# Patient Record
Sex: Female | Born: 1940 | Race: White | Hispanic: No | Smoking: Never smoker
Health system: Southern US, Community
[De-identification: ages and names within clinical notes are randomized; demographics above are authoritative.]

## PROBLEM LIST (undated history)

## (undated) DIAGNOSIS — R011 Cardiac murmur, unspecified: Secondary | ICD-10-CM

## (undated) DIAGNOSIS — E785 Hyperlipidemia, unspecified: Secondary | ICD-10-CM

## (undated) DIAGNOSIS — I10 Essential (primary) hypertension: Secondary | ICD-10-CM

## (undated) DIAGNOSIS — T8859XA Other complications of anesthesia, initial encounter: Secondary | ICD-10-CM

## (undated) DIAGNOSIS — R112 Nausea with vomiting, unspecified: Secondary | ICD-10-CM

## (undated) DIAGNOSIS — M8580 Other specified disorders of bone density and structure, unspecified site: Secondary | ICD-10-CM

## (undated) DIAGNOSIS — R739 Hyperglycemia, unspecified: Secondary | ICD-10-CM

## (undated) DIAGNOSIS — M1990 Unspecified osteoarthritis, unspecified site: Secondary | ICD-10-CM

## (undated) DIAGNOSIS — Z9889 Other specified postprocedural states: Secondary | ICD-10-CM

## (undated) HISTORY — DX: Hyperglycemia, unspecified: R73.9

## (undated) HISTORY — PX: CARPAL TUNNEL RELEASE: SHX101

## (undated) HISTORY — PX: BUNIONECTOMY: SHX129

## (undated) HISTORY — PX: CATARACT EXTRACTION: SUR2

## (undated) HISTORY — DX: Other specified disorders of bone density and structure, unspecified site: M85.80

## (undated) HISTORY — PX: ROTATOR CUFF REPAIR: SHX139

---

## 1999-03-04 HISTORY — PX: CHOLECYSTECTOMY: SHX55

## 2009-04-04 DIAGNOSIS — R112 Nausea with vomiting, unspecified: Secondary | ICD-10-CM | POA: Insufficient documentation

## 2009-04-04 DIAGNOSIS — R634 Abnormal weight loss: Secondary | ICD-10-CM | POA: Insufficient documentation

## 2009-04-04 DIAGNOSIS — I10 Essential (primary) hypertension: Secondary | ICD-10-CM | POA: Insufficient documentation

## 2009-04-04 DIAGNOSIS — E782 Mixed hyperlipidemia: Secondary | ICD-10-CM

## 2009-04-04 DIAGNOSIS — R109 Unspecified abdominal pain: Secondary | ICD-10-CM | POA: Insufficient documentation

## 2009-04-04 HISTORY — DX: Mixed hyperlipidemia: E78.2

## 2013-08-21 DIAGNOSIS — I10 Essential (primary) hypertension: Secondary | ICD-10-CM | POA: Insufficient documentation

## 2013-08-21 DIAGNOSIS — J309 Allergic rhinitis, unspecified: Secondary | ICD-10-CM

## 2013-08-21 DIAGNOSIS — M8949 Other hypertrophic osteoarthropathy, multiple sites: Secondary | ICD-10-CM | POA: Insufficient documentation

## 2013-08-21 DIAGNOSIS — M150 Primary generalized (osteo)arthritis: Secondary | ICD-10-CM

## 2013-08-21 DIAGNOSIS — R270 Ataxia, unspecified: Secondary | ICD-10-CM | POA: Insufficient documentation

## 2013-08-21 DIAGNOSIS — R609 Edema, unspecified: Secondary | ICD-10-CM | POA: Insufficient documentation

## 2013-08-21 DIAGNOSIS — M159 Polyosteoarthritis, unspecified: Secondary | ICD-10-CM | POA: Insufficient documentation

## 2013-08-21 DIAGNOSIS — M26629 Arthralgia of temporomandibular joint, unspecified side: Secondary | ICD-10-CM | POA: Insufficient documentation

## 2013-08-21 DIAGNOSIS — K5900 Constipation, unspecified: Secondary | ICD-10-CM | POA: Insufficient documentation

## 2013-08-21 DIAGNOSIS — N951 Menopausal and female climacteric states: Secondary | ICD-10-CM | POA: Insufficient documentation

## 2013-08-21 DIAGNOSIS — R42 Dizziness and giddiness: Secondary | ICD-10-CM | POA: Insufficient documentation

## 2013-08-21 DIAGNOSIS — M8580 Other specified disorders of bone density and structure, unspecified site: Secondary | ICD-10-CM | POA: Insufficient documentation

## 2013-08-21 DIAGNOSIS — K219 Gastro-esophageal reflux disease without esophagitis: Secondary | ICD-10-CM | POA: Insufficient documentation

## 2013-08-21 DIAGNOSIS — M810 Age-related osteoporosis without current pathological fracture: Secondary | ICD-10-CM | POA: Insufficient documentation

## 2013-08-21 DIAGNOSIS — IMO0002 Reserved for concepts with insufficient information to code with codable children: Secondary | ICD-10-CM | POA: Insufficient documentation

## 2013-08-21 HISTORY — DX: Allergic rhinitis, unspecified: J30.9

## 2013-08-21 HISTORY — DX: Age-related osteoporosis without current pathological fracture: M81.0

## 2013-08-21 HISTORY — DX: Primary generalized (osteo)arthritis: M15.0

## 2014-02-22 ENCOUNTER — Emergency Department (HOSPITAL_BASED_OUTPATIENT_CLINIC_OR_DEPARTMENT_OTHER): Payer: Medicare Other

## 2014-02-22 ENCOUNTER — Encounter (HOSPITAL_BASED_OUTPATIENT_CLINIC_OR_DEPARTMENT_OTHER): Payer: Self-pay | Admitting: *Deleted

## 2014-02-22 ENCOUNTER — Emergency Department (HOSPITAL_BASED_OUTPATIENT_CLINIC_OR_DEPARTMENT_OTHER)
Admission: EM | Admit: 2014-02-22 | Discharge: 2014-02-22 | Disposition: A | Discharge status: Home / Self Care | Payer: Medicare Other | Attending: Emergency Medicine | Admitting: Emergency Medicine

## 2014-02-22 DIAGNOSIS — E785 Hyperlipidemia, unspecified: Secondary | ICD-10-CM | POA: Insufficient documentation

## 2014-02-22 DIAGNOSIS — H109 Unspecified conjunctivitis: Secondary | ICD-10-CM | POA: Insufficient documentation

## 2014-02-22 DIAGNOSIS — R059 Cough, unspecified: Secondary | ICD-10-CM

## 2014-02-22 DIAGNOSIS — R05 Cough: Secondary | ICD-10-CM

## 2014-02-22 DIAGNOSIS — Z880 Allergy status to penicillin: Secondary | ICD-10-CM | POA: Insufficient documentation

## 2014-02-22 DIAGNOSIS — I10 Essential (primary) hypertension: Secondary | ICD-10-CM | POA: Insufficient documentation

## 2014-02-22 DIAGNOSIS — J209 Acute bronchitis, unspecified: Secondary | ICD-10-CM | POA: Diagnosis not present

## 2014-02-22 DIAGNOSIS — R000 Tachycardia, unspecified: Secondary | ICD-10-CM | POA: Diagnosis not present

## 2014-02-22 DIAGNOSIS — J40 Bronchitis, not specified as acute or chronic: Secondary | ICD-10-CM

## 2014-02-22 DIAGNOSIS — Z79899 Other long term (current) drug therapy: Secondary | ICD-10-CM | POA: Insufficient documentation

## 2014-02-22 HISTORY — DX: Essential (primary) hypertension: I10

## 2014-02-22 HISTORY — DX: Hyperlipidemia, unspecified: E78.5

## 2014-02-22 MED ORDER — HYDROCOD POLST-CHLORPHEN POLST 10-8 MG/5ML PO LQCR: 5.0000 mL | Freq: Two times a day (BID) | ORAL | Status: DC | PRN

## 2014-02-22 MED ORDER — ALBUTEROL SULFATE HFA 108 (90 BASE) MCG/ACT IN AERS
2.0000 | INHALATION_SPRAY | Freq: Once | RESPIRATORY_TRACT | Status: AC
  Administered 2014-02-22: 2 via RESPIRATORY_TRACT
  Filled 2014-02-22: qty 6.7

## 2014-02-22 MED ORDER — AZITHROMYCIN 250 MG PO TABS: 250.0000 mg | ORAL_TABLET | Freq: Every day | ORAL | Status: DC

## 2014-02-22 MED ORDER — ALBUTEROL SULFATE (2.5 MG/3ML) 0.083% IN NEBU
5.0000 mg | INHALATION_SOLUTION | Freq: Once | RESPIRATORY_TRACT | Status: AC
  Administered 2014-02-22: 5 mg via RESPIRATORY_TRACT
  Filled 2014-02-22: qty 6

## 2014-02-22 MED ORDER — ERYTHROMYCIN 5 MG/GM OP OINT: TOPICAL_OINTMENT | OPHTHALMIC | Status: DC

## 2014-02-22 NOTE — ED Provider Notes (Signed)
CSN: [id number]     Arrival date & time 02/22/14  1912 History   First MD Initiated Contact with Patient 02/22/14 2009     Chief Complaint  Patient presents with  . URI     (Consider location/radiation/quality/duration/timing/severity/associated sxs/prior Treatment) HPI Comments: 73 year old female with a past medical history of hyperlipidemia and hypertension presenting with cough and chest congestion 2 days. Cough is nonproductive. She reports chest pain with the cough only. Denies shortness of breath. She has tried taking over-the-counter cough medicine with no relief. Denies fever, nausea or vomiting. She is also complaining of redness and drainage to her left eye beginning earlier this evening. She has been in contact with her 229-year-old grandchild diagnosed with conjunctivitis. Denies any eye pain, just states that it is irritated.  Patient is a 73 y.o. female presenting with URI. The history is provided by the patient.  URI   Past Medical History  Diagnosis Date  . Hyperlipemia   . Hypertension    Past Surgical History  Procedure Laterality Date  . Cesarean section    . Rotator cuff repair    . Cataract extraction    . Carpal tunnel release     History reviewed. No pertinent family history. History  Substance Use Topics  . Smoking status: Never Smoker   . Smokeless tobacco: Not on file  . Alcohol Use: No   OB History    No data available     Review of Systems  10 Systems reviewed and are negative for acute change except as noted in the HPI.  Allergies  Penicillins  Home Medications   Prior to Admission medications   Medication Sig Start Date End Date Taking? Authorizing Provider  buPROPion (WELLBUTRIN XL) 300 MG 24 hr tablet Take 300 mg by mouth daily.   Yes Historical Provider, MD  furosemide (LASIX) 80 MG tablet Take 80 mg by mouth.   Yes Historical Provider, MD  lisinopril (PRINIVIL,ZESTRIL) 10 MG tablet Take 10 mg by mouth daily.   Yes Historical  Provider, MD  omeprazole (PRILOSEC) 20 MG capsule Take 20 mg by mouth daily.   Yes Historical Provider, MD  pravastatin (PRAVACHOL) 80 MG tablet Take 80 mg by mouth daily.   Yes Historical Provider, MD  azithromycin (ZITHROMAX) 250 MG tablet Take 1 tablet (250 mg total) by mouth daily. Take first 2 tablets together, then 1 every day until finished. 02/22/14   Kathrynn Speedobyn M Nyxon Strupp, PA-C  chlorpheniramine-HYDROcodone (TUSSIONEX PENNKINETIC ER) 10-8 MG/5ML LQCR Take 5 mLs by mouth every 12 (twelve) hours as needed for cough. 02/22/14   Kathrynn Speedobyn M Abner Ardis, PA-C  erythromycin ophthalmic ointment Place a 1/2 inch ribbon of ointment into the lower eyelid q6 hours. 02/22/14   Cassi Jenne M Geovannie Vilar, PA-C   BP 149/60 mmHg  Pulse 103  Temp(Src) 98.7 F (37.1 C)  Resp 18  Ht 5\' 4"  (1.626 m)  Wt 165 lb (74.844 kg)  BMI 28.31 kg/m2  SpO2 99% Physical Exam  Constitutional: She is oriented to person, place, and time. She appears well-developed and well-nourished. No distress.  HENT:  Head: Normocephalic and atraumatic.  Mouth/Throat: Oropharynx is clear and moist.  Eyes: EOM are normal. Pupils are equal, round, and reactive to light.  Left conjunctiva injected with purulent drainage from  nasal canthus.  Neck: Normal range of motion. Neck supple.  Cardiovascular: Regular rhythm and normal heart sounds.   Mild tachycardia.  Pulmonary/Chest: Effort normal. No respiratory distress.  Expiratory wheezes right upper to midlung fields. No  rhonchi. Normal air movement.  Musculoskeletal: Normal range of motion. She exhibits no edema.  Neurological: She is alert and oriented to person, place, and time. No sensory deficit.  Skin: Skin is warm and dry.  Psychiatric: She has a normal mood and affect. Her behavior is normal.  Nursing note and vitals reviewed.   ED Course  Procedures (including critical care time) Labs Review Labs Reviewed - No data to display  Imaging Review Dg Chest 2 View  02/22/2014   CLINICAL DATA:   Cough.  Congestion.  Hypertension.  EXAM: CHEST  2 VIEW  COMPARISON:  09/15/2012  FINDINGS: Linear subsegmental atelectasis noted along the left hemidiaphragm.  Airway thickening is present, suggesting bronchitis or reactive airways disease. No pneumonia identified.  Cardiac and mediastinal margins appear normal.  No pleural effusion.  IMPRESSION: 1. Airway thickening is present, suggesting bronchitis or reactive airways disease. 2. Subsegmental atelectasis along the left hemidiaphragm.   Electronically Signed   By: Herbie BaltimoreWalt  Liebkemann M.D.   On: 02/22/2014 21:02     EKG Interpretation None      MDM   Final diagnoses:  Bronchitis  Bilateral conjunctivitis   Patient presenting with cough. She is nontoxic appearing and in no apparent distress. Afebrile, mildly tachycardic, vitals otherwise stable. O2 sat 100% on room air. Right-sided wheezes heard on exam. Chest x-ray obtained showing airway thickening suggesting bronchitis or reactive airway disease, subsegmental atelectasis along left hemidiaphragm. Patient was given an albuterol treatment in the ED with significant improvement of her wheezing. She is stating she is starting to feel better. Given significance of cough, unilateral wheezing, will treat with antibiotics. Discharge home with an albuterol inhaler. Regarding conjunctivitis, patient started to develop mild erythema and small amount of drainage of her right eye along with her left eye which she presented with. Treat with erythromycin ointment. Advised warm compresses. Follow-up with PCP. She is stable for discharge. Return precautions given. Patient states understanding of treatment care plan and is agreeable.  Discussed with attending Dr. Criss AlvineGoldston who also evaluated patient and agrees with plan of care.   Kathrynn SpeedRobyn M Moris Ratchford, PA-C 02/22/14 2115  Audree CamelScott T Goldston, MD 03/02/14 [phone]

## 2014-02-22 NOTE — ED Notes (Signed)
PA at bedside.

## 2014-02-22 NOTE — ED Notes (Signed)
Pt c/o URi symtpoms  x 3 days also c/o left eye redness and d rainage x 1 hr , family at home with "pink eye"

## 2014-02-22 NOTE — ED Notes (Signed)
Cough, congestion x 2 days,  And slight redness to rt eye,   Left eye red and draining

## 2014-02-22 NOTE — Discharge Instructions (Signed)
Take azithromycin as directed for 5 days. Take 2 tablets the first day followed by 1 tablet each day after. Take Tussionex as directed for severe cough. No driving or operating heavy machinery while taking Tussionex as it may cause drowsiness. Use erythromycin eye ointment as directed. Apply warm compresses. Use albuterol inhaler every 4-6 hours as needed for cough and wheezing.  Bacterial Conjunctivitis Bacterial conjunctivitis, commonly called pink eye, is an inflammation of the clear membrane that covers the white part of the eye (conjunctiva). The inflammation can also happen on the underside of the eyelids. The blood vessels in the conjunctiva become inflamed, causing the eye to become red or pink. Bacterial conjunctivitis may spread easily from one eye to another and from person to person (contagious).  CAUSES  Bacterial conjunctivitis is caused by bacteria. The bacteria may come from your own skin, your upper respiratory tract, or from someone else with bacterial conjunctivitis. SYMPTOMS  The normally white color of the eye or the underside of the eyelid is usually pink or red. The pink eye is usually associated with irritation, tearing, and some sensitivity to light. Bacterial conjunctivitis is often associated with a thick, yellowish discharge from the eye. The discharge may turn into a crust on the eyelids overnight, which causes your eyelids to stick together. If a discharge is present, there may also be some blurred vision in the affected eye. DIAGNOSIS  Bacterial conjunctivitis is diagnosed by your caregiver through an eye exam and the symptoms that you report. Your caregiver looks for changes in the surface tissues of your eyes, which may point to the specific type of conjunctivitis. A sample of any discharge may be collected on a cotton-tip swab if you have a severe case of conjunctivitis, if your cornea is affected, or if you keep getting repeat infections that do not respond to treatment.  The sample will be sent to a lab to see if the inflammation is caused by a bacterial infection and to see if the infection will respond to antibiotic medicines. TREATMENT   Bacterial conjunctivitis is treated with antibiotics. Antibiotic eyedrops are most often used. However, antibiotic ointments are also available. Antibiotics pills are sometimes used. Artificial tears or eye washes may ease discomfort. HOME CARE INSTRUCTIONS   To ease discomfort, apply a cool, clean washcloth to your eye for 10-20 minutes, 3-4 times a day.  Gently wipe away any drainage from your eye with a warm, wet washcloth or a cotton ball.  Wash your hands often with soap and water. Use paper towels to dry your hands.  Do not share towels or washcloths. This may spread the infection.  Change or wash your pillowcase every day.  You should not use eye makeup until the infection is gone.  Do not operate machinery or drive if your vision is blurred.  Stop using contact lenses. Ask your caregiver how to sterilize or replace your contacts before using them again. This depends on the type of contact lenses that you use.  When applying medicine to the infected eye, do not touch the edge of your eyelid with the eyedrop bottle or ointment tube. SEEK IMMEDIATE MEDICAL CARE IF:   Your infection has not improved within 3 days after beginning treatment.  You had yellow discharge from your eye and it returns.  You have increased eye pain.  Your eye redness is spreading.  Your vision becomes blurred.  You have a fever or persistent symptoms for more than 2-3 days.  You have a fever  and your symptoms suddenly get worse.  You have facial pain, redness, or swelling. MAKE SURE YOU:   Understand these instructions.  Will watch your condition.  Will get help right away if you are not doing well or get worse. Document Released: 02/17/2005 Document Revised: 07/04/2013 Document Reviewed: 07/21/2011 [facility] Patient  Information 2015 [place], [place]. This information is not intended to replace advice given to you by your health care provider. Make sure you discuss any questions you have with your health care provider.  Cough, Adult  A cough is a reflex that helps clear your throat and airways. It can help heal the body or may be a reaction to an irritated airway. A cough may only last 2 or 3 weeks (acute) or may last more than 8 weeks (chronic).  CAUSES Acute cough:  Viral or bacterial infections. Chronic cough:  Infections.  Allergies.  Asthma.  Post-nasal drip.  Smoking.  Heartburn or acid reflux.  Some medicines.  Chronic lung problems (COPD).  Cancer. SYMPTOMS   Cough.  Fever.  Chest pain.  Increased breathing rate.  High-pitched whistling sound when breathing (wheezing).  Colored mucus that you cough up (sputum). TREATMENT   A bacterial cough may be treated with antibiotic medicine.  A viral cough must run its course and will not respond to antibiotics.  Your caregiver may recommend other treatments if you have a chronic cough. HOME CARE INSTRUCTIONS   Only take over-the-counter or prescription medicines for pain, discomfort, or fever as directed by your caregiver. Use cough suppressants only as directed by your caregiver.  Use a cold steam vaporizer or humidifier in your bedroom or home to help loosen secretions.  Sleep in a semi-upright position if your cough is worse at night.  Rest as needed.  Stop smoking if you smoke. SEEK IMMEDIATE MEDICAL CARE IF:   You have pus in your sputum.  Your cough starts to worsen.  You cannot control your cough with suppressants and are losing sleep.  You begin coughing up blood.  You have difficulty breathing.  You develop pain which is getting worse or is uncontrolled with medicine.  You have a fever. MAKE SURE YOU:   Understand these instructions.  Will watch your condition.  Will get help right away if you  are not doing well or get worse. Document Released: 08/16/2010 Document Revised: 05/12/2011 Document Reviewed: 08/16/2010 [facility] Patient Information 2015 [place], [place]. This information is not intended to replace advice given to you by your health care provider. Make sure you discuss any questions you have with your health care provider. Acute Bronchitis Bronchitis is inflammation of the airways that extend from the windpipe into the lungs (bronchi). The inflammation often causes mucus to develop. This leads to a cough, which is the most common symptom of bronchitis.  In acute bronchitis, the condition usually develops suddenly and goes away over time, usually in a couple weeks. Smoking, allergies, and asthma can make bronchitis worse. Repeated episodes of bronchitis may cause further lung problems.  CAUSES Acute bronchitis is most often caused by the same virus that causes a cold. The virus can spread from person to person (contagious) through coughing, sneezing, and touching contaminated objects. SIGNS AND SYMPTOMS   Cough.   Fever.   Coughing up mucus.   Body aches.   Chest congestion.   Chills.   Shortness of breath.   Sore throat.  DIAGNOSIS  Acute bronchitis is usually diagnosed through a physical exam. Your health care provider will also  ask you questions about your medical history. Tests, such as chest X-rays, are sometimes done to rule out other conditions.  TREATMENT  Acute bronchitis usually goes away in a couple weeks. Oftentimes, no medical treatment is necessary. Medicines are sometimes given for relief of fever or cough. Antibiotic medicines are usually not needed but may be prescribed in certain situations. In some cases, an inhaler may be recommended to help reduce shortness of breath and control the cough. A cool mist vaporizer may also be used to help thin bronchial secretions and make it easier to clear the chest.  HOME CARE INSTRUCTIONS  Get plenty of  rest.   Drink enough fluids to keep your urine clear or pale yellow (unless you have a medical condition that requires fluid restriction). Increasing fluids may help thin your respiratory secretions (sputum) and reduce chest congestion, and it will prevent dehydration.   Take medicines only as directed by your health care provider.  If you were prescribed an antibiotic medicine, finish it all even if you start to feel better.  Avoid smoking and secondhand smoke. Exposure to cigarette smoke or irritating chemicals will make bronchitis worse. If you are a smoker, consider using nicotine gum or skin patches to help control withdrawal symptoms. Quitting smoking will help your lungs heal faster.   Reduce the chances of another bout of acute bronchitis by washing your hands frequently, avoiding people with cold symptoms, and trying not to touch your hands to your mouth, nose, or eyes.   Keep all follow-up visits as directed by your health care provider.  SEEK MEDICAL CARE IF: Your symptoms do not improve after 1 week of treatment.  SEEK IMMEDIATE MEDICAL CARE IF:  You develop an increased fever or chills.   You have chest pain.   You have severe shortness of breath.  You have bloody sputum.   You develop dehydration.  You faint or repeatedly feel like you are going to pass out.  You develop repeated vomiting.  You develop a severe headache. MAKE SURE YOU:   Understand these instructions.  Will watch your condition.  Will get help right away if you are not doing well or get worse. Document Released: 03/27/2004 Document Revised: 07/04/2013 Document Reviewed: 08/10/2012 [facility] Patient Information 2015 [place], [place]. This information is not intended to replace advice given to you by your health care provider. Make sure you discuss any questions you have with your health care provider.

## 2016-09-15 ENCOUNTER — Other Ambulatory Visit (HOSPITAL_BASED_OUTPATIENT_CLINIC_OR_DEPARTMENT_OTHER): Payer: Self-pay | Admitting: Family Medicine

## 2016-09-15 ENCOUNTER — Ambulatory Visit (HOSPITAL_BASED_OUTPATIENT_CLINIC_OR_DEPARTMENT_OTHER)
Admission: RE | Admit: 2016-09-15 | Discharge: 2016-09-15 | Disposition: A | Discharge status: Home / Self Care | Payer: Medicare HMO | Source: Ambulatory Visit | Attending: Family Medicine | Admitting: Family Medicine

## 2016-09-15 DIAGNOSIS — M7989 Other specified soft tissue disorders: Secondary | ICD-10-CM | POA: Diagnosis present

## 2016-09-15 DIAGNOSIS — M7122 Synovial cyst of popliteal space [Baker], left knee: Secondary | ICD-10-CM | POA: Insufficient documentation

## 2017-06-10 DIAGNOSIS — F32A Depression, unspecified: Secondary | ICD-10-CM | POA: Insufficient documentation

## 2017-06-10 HISTORY — DX: Depression, unspecified: F32.A

## 2017-12-21 ENCOUNTER — Other Ambulatory Visit (HOSPITAL_COMMUNITY): Payer: Self-pay

## 2017-12-22 ENCOUNTER — Encounter (HOSPITAL_COMMUNITY): Payer: Medicare HMO

## 2018-01-05 ENCOUNTER — Encounter (HOSPITAL_COMMUNITY): Payer: Medicare HMO

## 2018-04-11 IMAGING — US US EXTREM LOW VENOUS*L*
1 series · 13 of 24 positions shown · non-contrast
Comparison: None.

CLINICAL DATA: Left lower extremity edema for the past 3 days.
Prolonged car ride approximately 6 days ago. Evaluate for DVT



[Series 1: us extrem low venous*left* · 0.08mm/px · 13 of 26 slices shown]
[im 1/26]
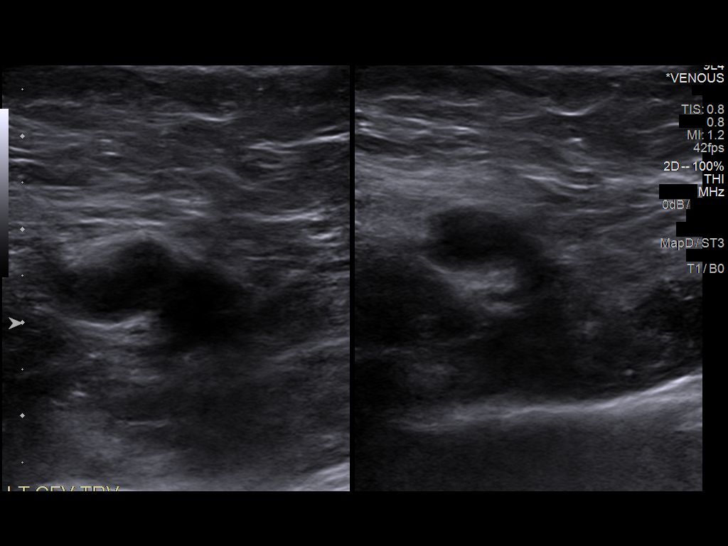
[im 3/26]
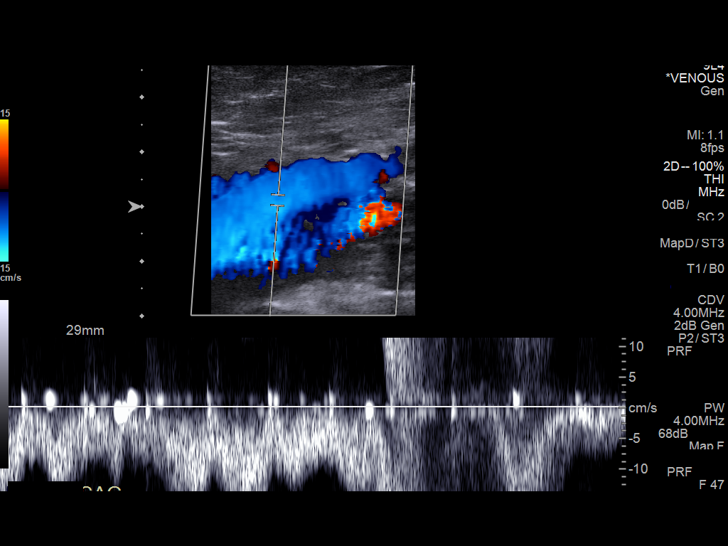
[im 5/26]
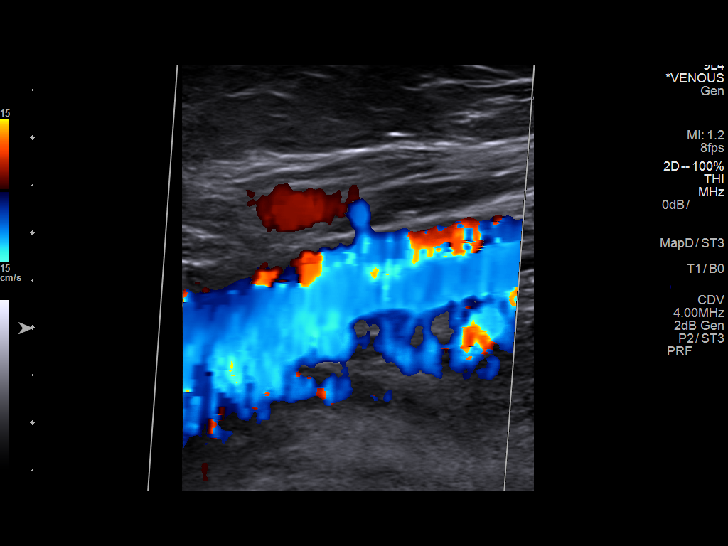
[im 7/26]
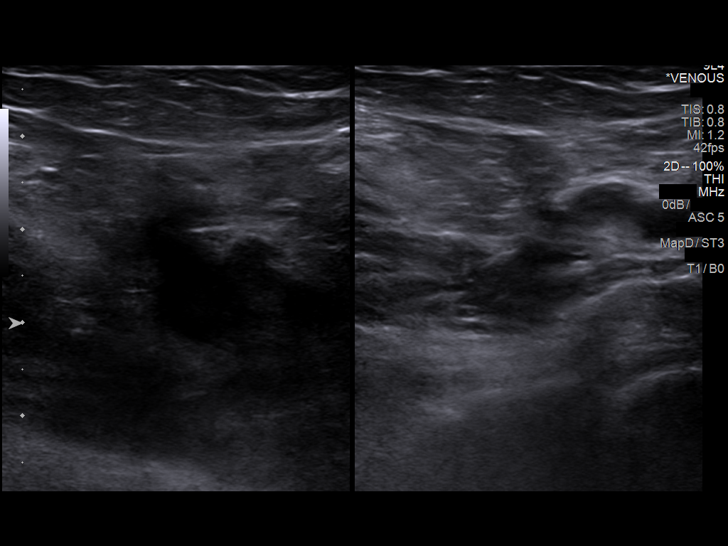
[im 9/26]
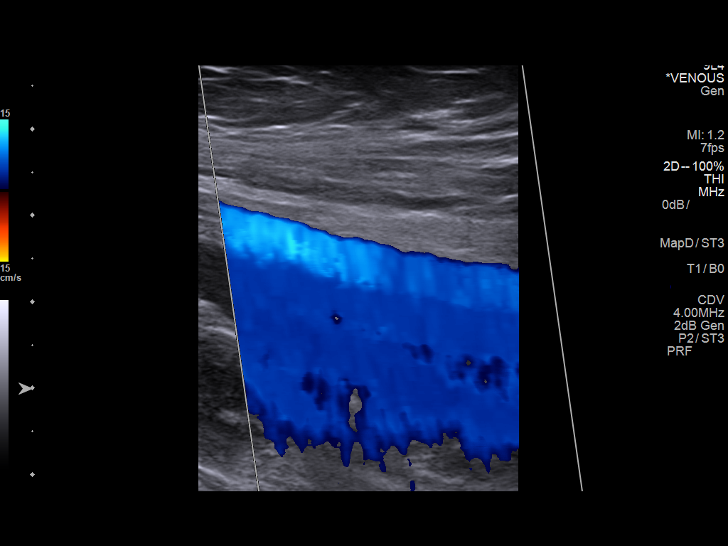
[im 11/26]
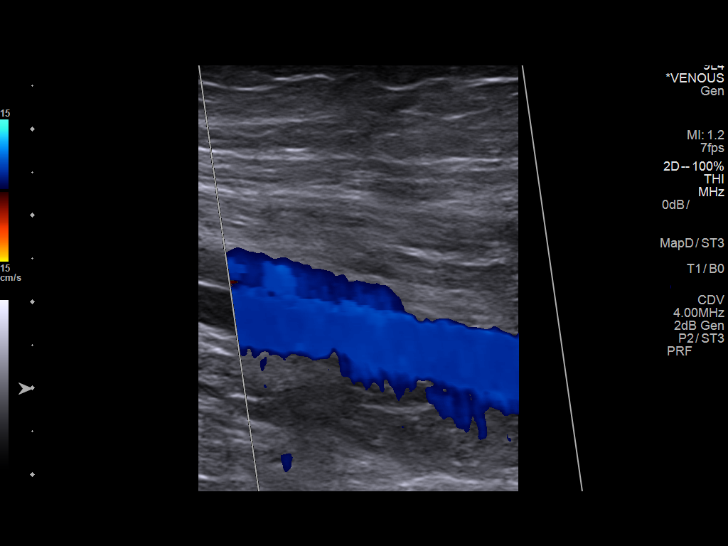
[im 14/26]
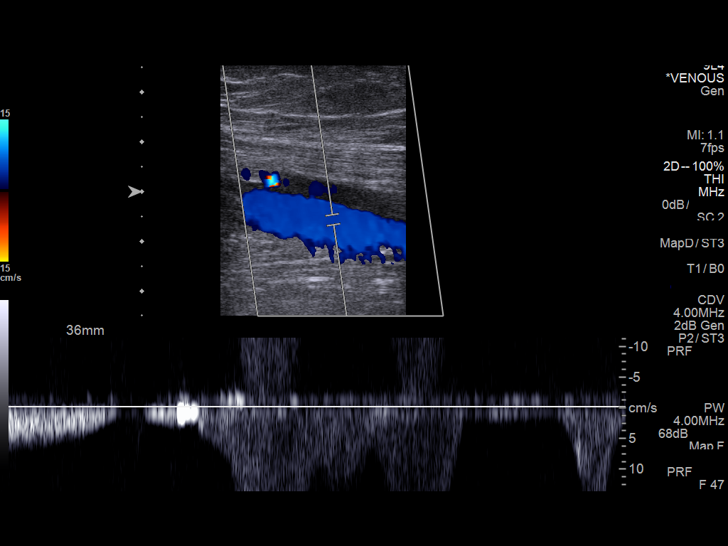
[im 15/26]
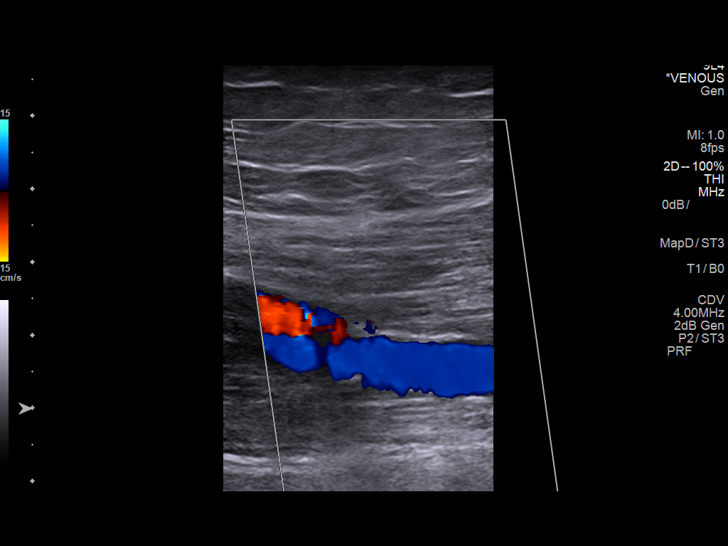
[im 17/26]
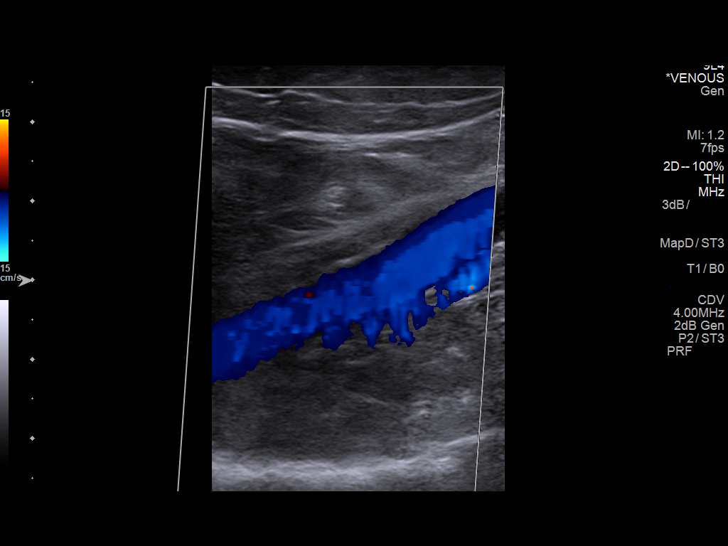
[im 19/26]
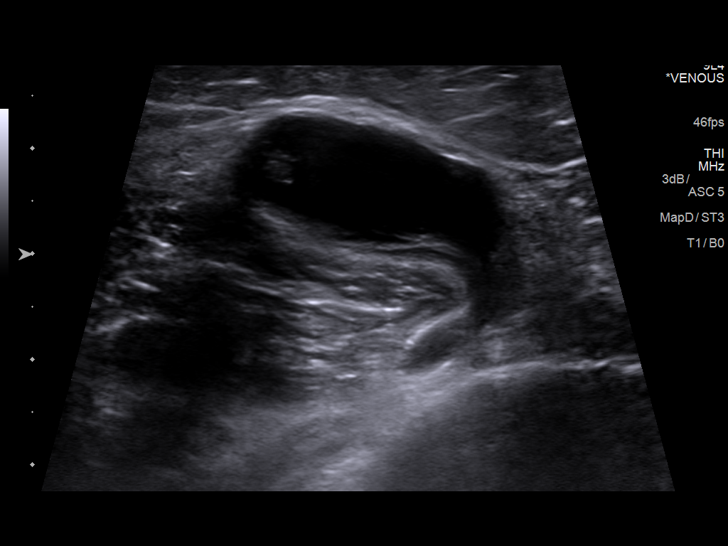
[im 21/26]
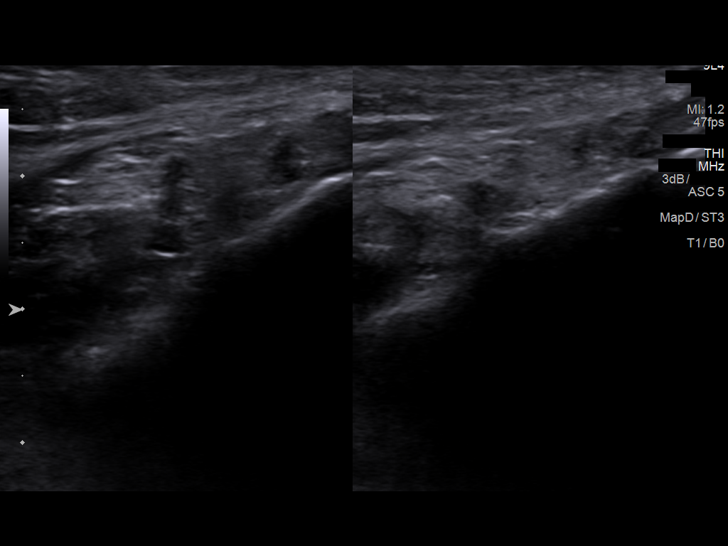
[im 23/26]
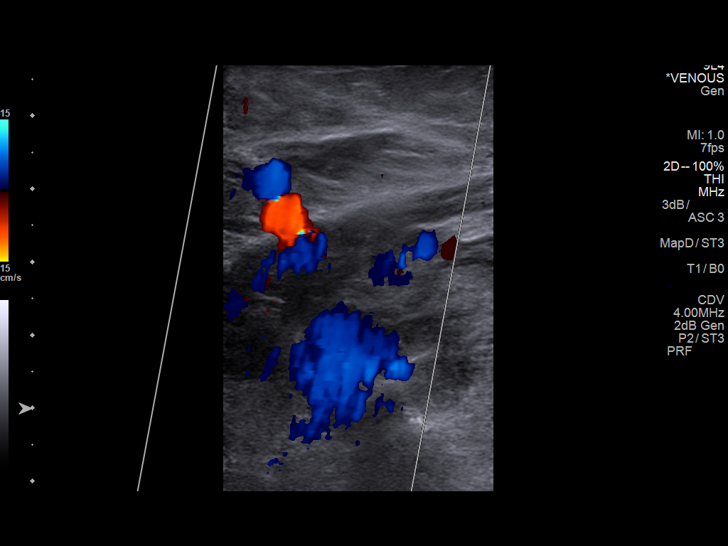
[im 26/26]
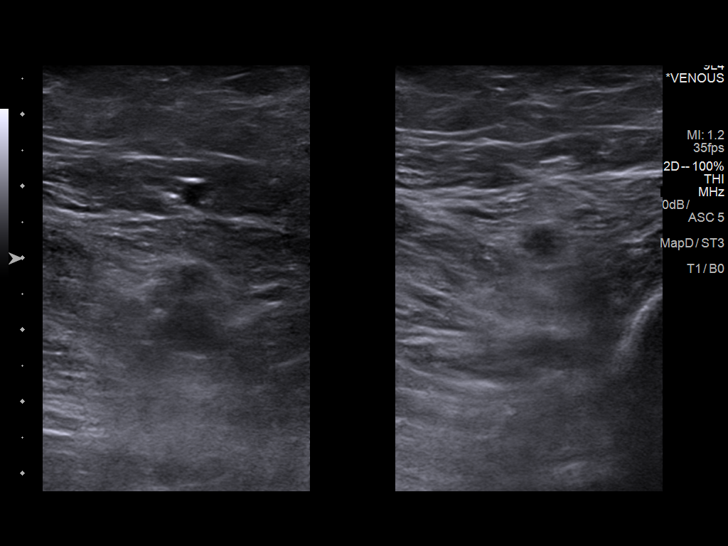

[13 of 24 positions shown; findings below may reference images not displayed]

FINDINGS: Contralateral Common Femoral Vein: Respiratory phasicity is normal
and symmetric with the symptomatic side. No evidence of thrombus.
Normal compressibility.

Common Femoral Vein: No evidence of thrombus. Normal
compressibility, respiratory phasicity and response to augmentation.

Saphenofemoral Junction: No evidence of thrombus. Normal
compressibility and flow on color Doppler imaging.

Profunda Femoral Vein: No evidence of thrombus. Normal
compressibility and flow on color Doppler imaging.

Femoral Vein: No evidence of thrombus. Normal compressibility,
respiratory phasicity and response to augmentation.

Popliteal Vein: No evidence of thrombus. Normal compressibility,
respiratory phasicity and response to augmentation.

Calf Veins: No evidence of thrombus. Normal compressibility and flow
on color Doppler imaging.

Superficial Great Saphenous Vein: No evidence of thrombus. Normal
compressibility and flow on color Doppler imaging.

Venous Reflux:  None.

Other Findings: Note is made of a serpiginous approximately 5.1 x
1.1 x 2.7 cm fluid collection about the posterior aspect of the left
knee.
IMPRESSION: 1. No evidence of DVT within the left lower extremity.
2. Incidentally noted approximately 5.1 cm left-sided Baker's cyst.

## 2019-04-13 ENCOUNTER — Ambulatory Visit: Payer: Self-pay

## 2019-04-13 ENCOUNTER — Other Ambulatory Visit: Payer: Self-pay

## 2019-04-13 ENCOUNTER — Encounter: Payer: Self-pay | Admitting: Family Medicine

## 2019-04-13 ENCOUNTER — Ambulatory Visit (INDEPENDENT_AMBULATORY_CARE_PROVIDER_SITE_OTHER): Payer: Medicare HMO | Admitting: Family Medicine

## 2019-04-13 DIAGNOSIS — M79671 Pain in right foot: Secondary | ICD-10-CM

## 2019-04-13 DIAGNOSIS — M21612 Bunion of left foot: Secondary | ICD-10-CM | POA: Diagnosis not present

## 2019-04-13 DIAGNOSIS — M25551 Pain in right hip: Secondary | ICD-10-CM | POA: Diagnosis not present

## 2019-04-13 DIAGNOSIS — M7502 Adhesive capsulitis of left shoulder: Secondary | ICD-10-CM

## 2019-04-13 DIAGNOSIS — M79672 Pain in left foot: Secondary | ICD-10-CM

## 2019-04-13 NOTE — Progress Notes (Signed)
Office Visit Note   Patient: Michele Williamson           Date of Birth: 18-Jan-1941           MRN: [id number] Visit Date: 04/13/2019 Requested by: No referring provider defined for this encounter. PCP: Default, Provider, MD  Subjective: Chief Complaint  Patient presents with  . Lower Back - Pain    Pain in the right lower back/buttock, with pain radiating down the posterior leg to the foot x 2 weeks. Pain was worse this morning, hurt to stand up. She says both of her feet are "bad" and it affects the way she walks.    HPI: She is here with multiple concerns.  2 weeks ago she started feeling pain in the right posterior hip, no injury.  Occasional radiation into the hamstring but not down the leg.  She has chronic pain in both feet.  She has undergone bunionectomy on the right and probable claw toe straightening of the second toe, but the surgery eventually failed.  She has had a total of 3 surgeries on the right foot.  The left foot has similar problems with bunion deformity and overlap of the great toe on the second.  She has tried finding the widest shoes possible but it still does not help.  She is not diabetic.  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  She has been through physical therapy and reached a plateau, she was told that she might need manipulation under anesthesia.                ROS:   All other systems were reviewed and are negative.  Objective: Vital Signs: There were no vitals taken for this visit.  Physical Exam:  General:  Alert and oriented, in no acute distress. Pulm:  Breathing unlabored. Psy:  Normal mood, congruent affect. Skin: No visible rash. Left shoulder: She has adhesive capsulitis with abduction of 45 degrees, external rotation about 25 degrees and internal rotation about 25 degrees.  Isometric rotator cuff strength is 5/5. Right hip: No pain with internal hip rotation, negative straight leg raise.  She is  tender along the sciatic notch, no tenderness at the greater trochanter.  Lower extremity strength and reflexes are normal. Feet: She has collapse of the transverse arch of both feet.  Great toe overlaps the second toe bilaterally.   Imaging: None today  Assessment & Plan: 1.  Right posterior hip pain, possibly piriformis syndrome versus lumbar disc protrusion. -Home exercises given.  Physical therapy referral.  X-rays and MRI scan if symptoms persist.  2.  Left shoulder adhesive capsulitis -Glenohumeral injection done today under ultrasound guidance.  If this helps, we can repeat in the future if needed.  If this does not help, then possibly manipulation under anesthesia per Dr. Lajoyce Corners.  3.  Chronic bilateral foot pain with deformities -Prescription for [employer] for custom orthotics with extra-depth shoes. -Consultation with Dr. Lajoyce Corners as well.     Procedures: Ultrasound-guided left glenohumeral injection: After sterile prep with Betadine, injected 8 cc 1% lidocaine without epinephrine and 40 mg methylprednisolone using a 22-gauge spinal needle, passing the needle through approach into the glenohumeral joint.  Injectate was seen filling the joint capsule.  She had mild improvement in range of motion immediately after injection.     PMFS History: Patient Active Problem List   Diagnosis Date Noted  . Depression 06/10/2017  . Allergic rhinitis 08/21/2013  . 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 Ataxia 08/21/2013  . Constipation 08/21/2013  . Dizziness 08/21/2013  . Edema 08/21/2013  . Esophageal reflux 08/21/2013  . Hypercalcemia 08/21/2013  . 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 Osteopenia 08/21/2013  . 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 Symptomatic menopausal or female climacteric states 08/21/2013  . Abdominal pain 04/04/2009  . Abnormal weight loss 04/04/2009  . Benign essential hypertension 04/04/2009  . Mixed  hyperlipidemia 04/04/2009  . Nausea and vomiting 04/04/2009   Past Medical History:  Diagnosis Date  . Hyperlipemia   . Hypertension     History reviewed. No pertinent family history.  Past Surgical History:  Procedure Laterality Date  . CARPAL TUNNEL RELEASE    . CATARACT EXTRACTION    . CESAREAN SECTION    . ROTATOR CUFF REPAIR     Social History   Occupational History  . Not on file  Tobacco Use  . Smoking status: Never Smoker  Substance and Sexual Activity  . Alcohol use: No  . Drug use: No  . Sexual activity: Yes    Birth control/protection: None

## 2019-04-25 ENCOUNTER — Ambulatory Visit: Payer: Medicare HMO | Admitting: Orthopedic Surgery

## 2019-05-06 ENCOUNTER — Telehealth: Payer: Self-pay | Admitting: Family Medicine

## 2019-05-06 NOTE — Telephone Encounter (Signed)
Angie with [facility] called in stated they have tried calling the pt to get her scheduled but the phone number they have for her happens to be disconnected.

## 2019-05-10 NOTE — Telephone Encounter (Signed)
I also tried calling the patient and the number is not in service. No other number on file for the patient.

## 2019-11-24 DIAGNOSIS — R944 Abnormal results of kidney function studies: Secondary | ICD-10-CM | POA: Insufficient documentation

## 2019-11-24 DIAGNOSIS — M2550 Pain in unspecified joint: Secondary | ICD-10-CM | POA: Insufficient documentation

## 2019-11-24 DIAGNOSIS — Z8639 Personal history of other endocrine, nutritional and metabolic disease: Secondary | ICD-10-CM | POA: Insufficient documentation

## 2020-02-03 ENCOUNTER — Telehealth: Payer: Self-pay | Admitting: General Practice

## 2020-02-03 NOTE — Telephone Encounter (Signed)
Patient looking to establish care with you , please advise if ok to schedule

## 2020-02-03 NOTE — Telephone Encounter (Signed)
OK 

## 2020-03-06 ENCOUNTER — Encounter: Payer: Self-pay | Admitting: Family

## 2020-03-06 ENCOUNTER — Ambulatory Visit (INDEPENDENT_AMBULATORY_CARE_PROVIDER_SITE_OTHER): Payer: Medicare Other | Admitting: Family

## 2020-03-06 ENCOUNTER — Other Ambulatory Visit: Payer: Self-pay

## 2020-03-06 VITALS — BP 129/56 | HR 67 | Temp 98.6°F | Resp 16 | Ht 63.0 in | Wt 165.0 lb

## 2020-03-06 DIAGNOSIS — E782 Mixed hyperlipidemia: Secondary | ICD-10-CM

## 2020-03-06 DIAGNOSIS — M8580 Other specified disorders of bone density and structure, unspecified site: Secondary | ICD-10-CM

## 2020-03-06 DIAGNOSIS — Z808 Family history of malignant neoplasm of other organs or systems: Secondary | ICD-10-CM | POA: Diagnosis not present

## 2020-03-06 DIAGNOSIS — K219 Gastro-esophageal reflux disease without esophagitis: Secondary | ICD-10-CM

## 2020-03-06 DIAGNOSIS — I10 Essential (primary) hypertension: Secondary | ICD-10-CM

## 2020-03-06 DIAGNOSIS — M1990 Unspecified osteoarthritis, unspecified site: Secondary | ICD-10-CM

## 2020-03-06 DIAGNOSIS — Z8639 Personal history of other endocrine, nutritional and metabolic disease: Secondary | ICD-10-CM | POA: Diagnosis not present

## 2020-03-06 DIAGNOSIS — R609 Edema, unspecified: Secondary | ICD-10-CM

## 2020-03-06 DIAGNOSIS — Z8249 Family history of ischemic heart disease and other diseases of the circulatory system: Secondary | ICD-10-CM

## 2020-03-06 LAB — COMPREHENSIVE METABOLIC PANEL
ALT: 14 U/L (ref 0–35)
AST: 19 U/L (ref 0–37)
Albumin: 4.8 g/dL (ref 3.5–5.2)
Alkaline Phosphatase: 76 U/L (ref 39–117)
BUN: 17 mg/dL (ref 6–23)
CO2: 32 mEq/L (ref 19–32)
Calcium: 10.5 mg/dL (ref 8.4–10.5)
Chloride: 102 mEq/L (ref 96–112)
Creatinine, Ser: 0.86 mg/dL (ref 0.40–1.20)
GFR: 65.19 mL/min (ref >60.00)
Glucose, Bld: 93 mg/dL (ref 70–99)
Potassium: 4.1 mEq/L (ref 3.5–5.1)
Sodium: 141 mEq/L (ref 135–145)
Total Bilirubin: 0.5 mg/dL (ref 0.2–1.2)
Total Protein: 7.4 g/dL (ref 6.0–8.3)

## 2020-03-06 LAB — TSH: TSH: 1.05 u[IU]/mL (ref 0.35–4.50)

## 2020-03-06 LAB — LIPID PANEL
Cholesterol: 144 mg/dL (ref 0–200)
HDL: 41.6 mg/dL (ref >39.00)
NonHDL: 102.49
Total CHOL/HDL Ratio: 3
Triglycerides: 213 mg/dL — ABNORMAL HIGH (ref 0.0–149.0)
VLDL: 42.6 mg/dL — ABNORMAL HIGH (ref 0.0–40.0)

## 2020-03-06 LAB — LDL CHOLESTEROL, DIRECT: Direct LDL: 70 mg/dL

## 2020-03-06 NOTE — Patient Instructions (Addendum)
Please complete lab work prior to leaving.  Welcome to [facility]! 

## 2020-03-06 NOTE — Progress Notes (Signed)
Subjective:    Patient ID: Michele Williamson, female    DOB: [birth year], 80 y.o.   MRN: [id number]  HPI   Patient is a 80 yr old female who presents today to establish care.  pmhx is significant for the following:  Hyperlipidemia- was on  pravastatin 80mg  daily, now on crestor for about 3 months. Fasting today.  Reports trigs in the 2000's before she was treated.   History of hypothyroid as a child.    Hx of OA- bothers her most in her feet, used icy hot, blue emu, and acetaminophen bid.   HTN- maintained on lisinopril 10mg  once daily. Denies hx of chronic cough.  BP Readings from Last 3 Encounters:  03/06/20 (!) 129/56  02/22/14 143/60   Osteopenia-   Hx of depression-reports mood has been stable.  She has been on furosemide for many years.  She reports chronic lower extremity edema for many years.  She has never had a cardiac work-up.  Review of Systems  Constitutional: Negative for unexpected weight change.  Respiratory: Negative for cough and shortness of breath.   Cardiovascular: Negative for chest pain.  Gastrointestinal: Negative for blood in stool, constipation, diarrhea, nausea and vomiting.  Genitourinary: Negative for dysuria, frequency and hematuria.  Musculoskeletal: Positive for arthralgias (left frozen shoulder) and neck pain (some chronic pain).  Skin:       Skin lesion on face   Neurological: Negative for headaches.  Hematological: Negative for adenopathy.  Psychiatric/Behavioral:       Denies anxiety      Past Medical History:  Diagnosis Date  . Hyperlipemia   . Hypertension      Social History   Socioeconomic History  . Marital status: Widowed    Spouse name: Not on file  . Number of children: Not on file  . Years of education: Not on file  . Highest education level: Not on file  Occupational History  . Not on file  Tobacco Use  . Smoking status: Never Smoker  . Smokeless tobacco: Never Used  Vaping Use  . Vaping Use: Never used   Substance and Sexual Activity  . Alcohol use: No  . Drug use: No  . Sexual activity: Yes    Birth control/protection: None  Other Topics Concern  . Not on file  Social History Narrative  . Not on file   Social Determinants of Health   Financial Resource Strain: Not on file  Food Insecurity: Not on file  Transportation Needs: Not on file  Physical Activity: Inactive  . Days of Exercise per Week: 0 days  . Minutes of Exercise per Session: 0 min  Stress: Not on file  Social Connections: Not on file  Intimate Partner Violence: Not on file    Past Surgical History:  Procedure Laterality Date  . BUNIONECTOMY Right   . CARPAL TUNNEL RELEASE    . CATARACT EXTRACTION    . CESAREAN SECTION    . CHOLECYSTECTOMY  2001  . ROTATOR CUFF REPAIR      Family History  Problem Relation Age of Onset  . Hypertension Mother   . Heart disease Mother   . Bone cancer Father   . Hypertension Father   . Hyperlipidemia Father     Allergies  Allergen Reactions  . Penicillins   . Pregabalin     Dizziness  . Tramadol Nausea And Vomiting    Current Outpatient Medications on File Prior to Visit  Medication Sig Dispense Refill  . Calcium Carbonate-Vitamin D (  CALCIUM 500 + D PO) Take by mouth daily.    . furosemide (LASIX) 80 MG tablet Take 80 mg by mouth.    Marland Kitchen lisinopril (PRINIVIL,ZESTRIL) 10 MG tablet Take 10 mg by mouth daily.    Marland Kitchen omeprazole (PRILOSEC) 20 MG capsule Take 20 mg by mouth daily.    . rosuvastatin (CRESTOR) 20 MG tablet Take 20 mg by mouth at bedtime.     No current facility-administered medications on file prior to visit.    BP (!) 129/56 (BP Location: Right Arm, Patient Position: Sitting, Cuff Size: Small)   Pulse 67   Temp 98.6 F (37 C) (Oral)   Resp 16   Ht 5\' 3"  (1.6 m)   Wt 165 lb (74.8 kg)   SpO2 97%   BMI 29.23 kg/m       Objective:   Physical Exam Constitutional:      Appearance: She is well-developed and well-nourished.  Cardiovascular:      Rate and Rhythm: Normal rate and regular rhythm.     Heart sounds: Normal heart sounds. No murmur heard.   Pulmonary:     Effort: Pulmonary effort is normal. No respiratory distress.     Breath sounds: Normal breath sounds. No wheezing.  Musculoskeletal:     Right lower leg: 2+ Edema present.     Left lower leg: 2+ Edema present.  Psychiatric:        Mood and Affect: Mood and affect normal.        Behavior: Behavior normal.        Thought Content: Thought content normal.        Judgment: Judgment normal.           Assessment & Plan:  Hyperlipidemia-will obtain follow-up lipid panel to see how her lipids look on Crestor 20 mg once daily.  History of hypothyroid-this was as a child.  Will obtain baseline TSH.  Osteoarthritis-recommended Tylenol as needed.  Hypertension-blood pressure is stable on lisinopril 10 mg p.o. daily.  Continue same.  Chronic lower extremity edema- continue lasix, obtain follow up cmet.  Will refer to cardiology for further work up.  Family history of melanoma- recommended referral to dermatology for annual skin cancer screening.  HTN- bp is stable continue lisinopril 10mg .  Hx of osteopenia- will obtain follow up bone density. Continue caltrate.  GERD- stable on omeprazole 20mg  once daily.  This visit occurred during the SARS-CoV-2 public health emergency.  Safety protocols were in place, including screening questions prior to the visit, additional usage of staff PPE, and extensive cleaning of exam room while observing appropriate contact time as indicated for disinfecting solutions.

## 2020-03-07 DIAGNOSIS — E785 Hyperlipidemia, unspecified: Secondary | ICD-10-CM | POA: Insufficient documentation

## 2020-03-07 DIAGNOSIS — I10 Essential (primary) hypertension: Secondary | ICD-10-CM | POA: Insufficient documentation

## 2020-03-08 DIAGNOSIS — M1990 Unspecified osteoarthritis, unspecified site: Secondary | ICD-10-CM | POA: Insufficient documentation

## 2020-03-08 NOTE — Progress Notes (Signed)
Cardiology Office Note:    Date:  03/09/2020   ID:  Michele Williamson, DOB 11-Oct-1940, MRN [id number]  PCP:  Sandford Craze, NP  Cardiologist:  Norman Herrlich, MD   Referring MD: Sandford Craze, NP  ASSESSMENT:    1. Bilateral edema of lower extremity   2. Benign essential hypertension   3. Mixed hyperlipidemia   4. LBBB (left bundle branch block)    PLAN:    In order of problems listed above:  1. No clear-cut etiology I told her good tools include a proBNP level if normal makes heart failure unlikely an echocardiogram to assess for left right ventricular function and pulmonary artery pressures. 2. BP is at target continue current medical therapy including ACE inhibitor avoid calcium channel blockers with her chronic edema 3. Stable continue her high intensity statin she is at increased cardiovascular risk with age hypertension hyperlipidemia I asked her to resume low-dose aspirin 81 mg daily 4. Further evaluation for cardiomyopathy echocardiogram and CAD cardiac CTA.  Next appointment 6 weeks to review the results of her studies.  At this time I am not can to change her medical therapy.   Medication Adjustments/Labs and Tests Ordered: Current medicines are reviewed at length with the patient today.  Concerns regarding medicines are outlined above.  Orders Placed This Encounter  Procedures  . CT CORONARY MORPH W/CTA COR W/SCORE W/CA W/CM &/OR WO/CM  . CT CORONARY FRACTIONAL FLOW RESERVE DATA PREP  . CT CORONARY FRACTIONAL FLOW RESERVE FLUID ANALYSIS  . Basic metabolic panel  . Pro b natriuretic peptide (BNP)  . EKG 12-Lead  . ECHOCARDIOGRAM COMPLETE   Meds ordered this encounter  Medications  . aspirin EC 81 MG tablet    Sig: Take 1 tablet (81 mg total) by mouth daily. Swallow whole.    Dispense:  90 tablet    Refill:  3  . metoprolol tartrate (LOPRESSOR) 50 MG tablet    Sig: Take 1 tablet (50 mg total) by mouth once for 1 dose. Take two hours prior to your cardiac  CT    Dispense:  1 tablet    Refill:  0    Chief complaint: Edema lower extremities  Do I have heart disease?  History of Present Illness:    Michele Zumstein is a 80 y.o. female with hypertension and hyperlipidemia who is being seen today for the evaluation of cardiovascular risk at the request of Sandford Craze, NP.  The note from her primary care physician in details a history of hypertension hyperlipidemia and chronic lower extremity edema treated with a loop diuretic with a notation should be referred to cardiology for further follow-up.  She had a lower extremity venous duplex performed in 2018 which showed no evidence of thromboembolism but she did have a popliteal cyst on the left 5.1 cm.  Chest x-ray performed in 2015 December showed airway thickening suggesting bronchitis or reactive airway disease and atelectasis cardiac and mediastinal structures were normal.  X-ray in Care Everywhere October 2018 shows slight atelectasis at the left base normal cardiovascular structures no indication of heart failure left bundle branch block emergency room [facility] or diagnosed with his diet dizziness and diaphoresis and there is a notation she was advised hospitalization but declined.  Her son is a Careers information officer in [place] area. She has a history of taking loop diuretic furosemide 80 mg daily for about 10 years and her peripheral edema has worsened. She takes care of her 58 year old child and is worried about the  potential for underlying heart disease with abnormal EKG and her peripheral edema of uncertain etiology.  She never had a cardiac evaluation for cardiomyopathy or CAD. She is not having shortness of breath chest pain palpitation or syncope. Earlier in life she was told she had irregular heartbeat but no precise diagnosis.  She has no history of congenital or rheumatic heart disease. Family history is noteworthy for mother who developed heart disease at about her  age. She is on lipid-lowering therapy and antihypertensive with good clinical response of target She stopped taking aspirin at age 36.  Past Medical History:  Diagnosis Date  . Hyperlipemia   . Hypertension     Past Surgical History:  Procedure Laterality Date  . BUNIONECTOMY Right   . CARPAL TUNNEL RELEASE    . CATARACT EXTRACTION    . CESAREAN SECTION    . CHOLECYSTECTOMY  07-08-1999  . ROTATOR CUFF REPAIR      Current Medications: Current Meds  Medication Sig  . aspirin EC 81 MG tablet Take 1 tablet (81 mg total) by mouth daily. Swallow whole.  . Calcium Carbonate-Vitamin D (CALCIUM 500 + D PO) Take by mouth daily.  . furosemide (LASIX) 80 MG tablet Take 80 mg by mouth.  Marland Kitchen lisinopril (PRINIVIL,ZESTRIL) 10 MG tablet Take 10 mg by mouth daily.  Marland Kitchen loratadine (ALLERGY RELIEF) 10 MG tablet Take 10 mg by mouth daily. [employer]  . metoprolol tartrate (LOPRESSOR) 50 MG tablet Take 1 tablet (50 mg total) by mouth once for 1 dose. Take two hours prior to your cardiac CT  . omeprazole (PRILOSEC) 20 MG capsule Take 20 mg by mouth daily.  . rosuvastatin (CRESTOR) 20 MG tablet Take 20 mg by mouth at bedtime.     Allergies:   Penicillins, Pregabalin, and Tramadol   Social History   Socioeconomic History  . Marital status: Widowed    Spouse name: Not on file  . Number of children: Not on file  . Years of education: Not on file  . Highest education level: Not on file  Occupational History  . Not on file  Tobacco Use  . Smoking status: Never Smoker  . Smokeless tobacco: Never Used  Vaping Use  . Vaping Use: Never used  Substance and Sexual Activity  . Alcohol use: No  . Drug use: No  . Sexual activity: Yes    Birth control/protection: None  Other Topics Concern  . Not on file  Social History Narrative   Lives in [place]   Has an adopted son born 07/07/2008   Husband died in 07/08/06   Retired, worked as an Insurance underwriter   Enjoys senior center   Has a Veterinary surgeon   Completed  HS   Social Determinants of Corporate investment banker Strain: Not on [employer] Insecurity: Not on file  Transportation Needs: Not on file  Physical Activity: Inactive  . Days of Exercise per Week: 0 days  . Minutes of Exercise per Session: 0 min  Stress: Not on file  Social Connections: Not on file     Family History: The patient's family history includes Bone cancer in her father; Heart disease in her mother; Hyperlipidemia in her father; Hypertension in her father and mother; Melanoma in her brother; Obesity in her son; Stomach cancer in her maternal grandmother.  ROS:   ROS Please see the history of present illness.     All other systems reviewed and are negative.  EKGs/Labs/Other Studies Reviewed:  The following studies were reviewed today:   EKG:  EKG is  ordered today.  The ekg ordered today is personally reviewed and demonstrates left bundle branch block QRS duration relatively narrow 124 ms  Recent Labs: 03/06/2020: ALT 14; BUN 17; Creatinine, Ser 0.86; Potassium 4.1; Sodium 141; TSH 1.05  Recent Lipid Panel    Component Value Date/Time   CHOL 144 03/06/2020 0900   TRIG 213.0 (H) 03/06/2020 0900   HDL 41.60 03/06/2020 0900   CHOLHDL 3 03/06/2020 0900   VLDL 42.6 (H) 03/06/2020 0900   LDLDIRECT 70.0 03/06/2020 0900    Physical Exam:    VS:  BP 130/60   Pulse 86   Ht 5\' 3"  (1.6 m)   Wt 165 lb (74.8 kg)   SpO2 95%   BMI 29.23 kg/m     Wt Readings from Last 3 Encounters:  03/09/20 165 lb (74.8 kg)  03/06/20 165 lb (74.8 kg)  02/22/14 165 lb (74.8 kg)     GEN: Is her age well nourished, well developed in no acute distress HEENT: Normal NECK: No JVD; No carotid bruits LYMPHATICS: No lymphadenopathy CARDIAC: S2 is paradoxical RRR, no murmurs, rubs, gallops RESPIRATORY:  Clear to auscultation without rales, wheezing or rhonchi  ABDOMEN: Soft, non-tender, non-distended MUSCULOSKELETAL: 1+ bilateral lower extremity edema; No deformity  SKIN: Warm and  dry NEUROLOGIC:  Alert and oriented x 3 PSYCHIATRIC:  Normal affect     Signed, Shirlee More, MD  03/09/2020 10:28 AM    [place]

## 2020-03-09 ENCOUNTER — Ambulatory Visit: Payer: Medicare Other | Admitting: Cardiology

## 2020-03-09 ENCOUNTER — Encounter: Payer: Self-pay | Admitting: Cardiology

## 2020-03-09 ENCOUNTER — Other Ambulatory Visit: Payer: Self-pay

## 2020-03-09 ENCOUNTER — Encounter: Payer: Self-pay | Admitting: Family

## 2020-03-09 VITALS — BP 130/60 | HR 86 | Ht 63.0 in | Wt 165.0 lb

## 2020-03-09 DIAGNOSIS — I10 Essential (primary) hypertension: Secondary | ICD-10-CM | POA: Diagnosis not present

## 2020-03-09 DIAGNOSIS — R600 Localized edema: Secondary | ICD-10-CM | POA: Diagnosis not present

## 2020-03-09 DIAGNOSIS — E782 Mixed hyperlipidemia: Secondary | ICD-10-CM | POA: Diagnosis not present

## 2020-03-09 DIAGNOSIS — I447 Left bundle-branch block, unspecified: Secondary | ICD-10-CM | POA: Diagnosis not present

## 2020-03-09 MED ORDER — ASPIRIN EC 81 MG PO TBEC: 81.0000 mg | DELAYED_RELEASE_TABLET | Freq: Every day | ORAL | 3 refills | Status: DC

## 2020-03-09 MED ORDER — METOPROLOL TARTRATE 50 MG PO TABS: 50.0000 mg | ORAL_TABLET | Freq: Once | ORAL | 0 refills | Status: DC

## 2020-03-09 NOTE — Patient Instructions (Addendum)
Medication Instructions:  Your physician has recommended you make the following change in your medication:  START: Aspirin 81 mg take one tablet by mouth daily.  *If you need a refill on your cardiac medications before your next appointment, please call your pharmacy*   Lab Work: Your physician recommends that you return for lab work in: [place]  Within one week of your cardiac CT:  BMP  If you have labs (blood work) drawn today and your tests are completely normal, you will receive your results only by: Marland Kitchen MyChart Message (if you have MyChart) OR . A paper copy in the mail If you have any lab test that is abnormal or we need to change your treatment, we will call you to review the results.   Testing/Procedures: Your cardiac CT will be scheduled at the below location:   [facility] [address] [place], [place] [address] [phone]  If scheduled at [facility], please arrive at the [facility] main entrance of [facility] 30 minutes prior to test start time. Proceed to the [facility] Radiology Department (first floor) to check-in and test prep.  Please follow these instructions carefully (unless otherwise directed):   On the Night Before the Test: . Be sure to Drink plenty of water. . Do not consume any caffeinated/decaffeinated beverages or chocolate 12 hours prior to your test. . Do not take any antihistamines 12 hours prior to your test.  On the Day of the Test: . Drink plenty of water. Do not drink any water within one hour of the test. . Do not eat any food 4 hours prior to the test. . You may take your regular medications prior to the test.  . Take metoprolol (Lopressor) two hours prior to test. . HOLD Furosemide morning of the test. . FEMALES- please wear underwire-free bra if available       After the Test: . Drink plenty of water. . After receiving IV contrast, you may experience a mild flushed feeling. This is  normal. . On occasion, you may experience a mild rash up to 24 hours after the test. This is not dangerous. If this occurs, you can take Benadryl 25 mg and increase your fluid intake. . If you experience trouble breathing, this can be serious. If it is severe call 911 IMMEDIATELY. If it is mild, please call our office. . If you take any of these medications: Glipizide/Metformin, Avandament, Glucavance, please do not take 48 hours after completing test unless otherwise instructed.   Once we have confirmed authorization from your insurance company, we will call you to set up a date and time for your test. Based on how quickly your insurance processes prior authorizations requests, please allow up to 4 weeks to be contacted for scheduling your Cardiac CT appointment. Be advised that routine Cardiac CT appointments could be scheduled as many as 8 weeks after your provider has ordered it.  For non-scheduling related questions, please contact the cardiac imaging nurse navigator should you have any questions/concerns: Marchia Bond, Cardiac Imaging Nurse Navigator Burley Saver, Interim Cardiac Imaging Nurse [place] [facility] Direct Office Dial: [phone]   For scheduling needs, including cancellations and rescheduling, please call [place], [phone].  Your physician has requested that you have an echocardiogram. Echocardiography is a painless test that uses sound waves to create images of your heart. It provides your doctor with information about the size and shape of your heart and how well your  heart's chambers and valves are working. This procedure takes approximately one hour. There are no restrictions for this procedure.     Follow-Up: At [facility], you and your health needs are our priority.  As part of our continuing mission to provide you with exceptional heart care, we have created designated Provider Care Teams.  These Care Teams include your primary  Cardiologist (physician) and Advanced Practice Providers (APPs -  Physician Assistants and Nurse Practitioners) who all work together to provide you with the care you need, when you need it.  We recommend signing up for the patient portal called "MyChart".  Sign up information is provided on this After Visit Summary.  MyChart is used to connect with patients for Virtual Visits (Telemedicine).  Patients are able to view lab/test results, encounter notes, upcoming appointments, etc.  Non-urgent messages can be sent to your provider as well.   To learn more about what you can do with MyChart, go to [email or web address].    Your next appointment:   6 week(s)  The format for your next appointment:   In Person  Provider:   Shirlee More, MD   Other Instructions

## 2020-03-09 NOTE — Progress Notes (Signed)
MAILED OUT TO PT

## 2020-03-10 LAB — PRO B NATRIURETIC PEPTIDE: NT-Pro BNP: 153 pg/mL (ref 0–738)

## 2020-03-12 ENCOUNTER — Telehealth: Payer: Self-pay

## 2020-03-12 NOTE — Telephone Encounter (Signed)
Spoke with patient regarding results and recommendation.  Patient verbalizes understanding and is agreeable to plan of care. Advised patient to call back with any issues or concerns.  

## 2020-03-12 NOTE — Telephone Encounter (Signed)
-----   Message from Baldo Daub, MD sent at 03/11/2020 10:38 AM EST ----- Normal result, congestive heart failure is unlikely.

## 2020-03-13 ENCOUNTER — Other Ambulatory Visit (HOSPITAL_BASED_OUTPATIENT_CLINIC_OR_DEPARTMENT_OTHER): Payer: Medicare Other

## 2020-03-15 ENCOUNTER — Other Ambulatory Visit: Payer: Self-pay

## 2020-03-15 ENCOUNTER — Encounter: Payer: Self-pay | Admitting: Family

## 2020-03-15 ENCOUNTER — Ambulatory Visit (HOSPITAL_BASED_OUTPATIENT_CLINIC_OR_DEPARTMENT_OTHER)
Admission: RE | Admit: 2020-03-15 | Discharge: 2020-03-15 | Disposition: A | Discharge status: Home / Self Care | Payer: Medicare Other | Source: Ambulatory Visit | Attending: Family | Admitting: Family

## 2020-03-15 DIAGNOSIS — M8580 Other specified disorders of bone density and structure, unspecified site: Secondary | ICD-10-CM

## 2020-03-15 DIAGNOSIS — Z13820 Encounter for screening for osteoporosis: Secondary | ICD-10-CM | POA: Insufficient documentation

## 2020-03-15 DIAGNOSIS — M810 Age-related osteoporosis without current pathological fracture: Secondary | ICD-10-CM | POA: Diagnosis not present

## 2020-03-15 DIAGNOSIS — Z780 Asymptomatic menopausal state: Secondary | ICD-10-CM | POA: Insufficient documentation

## 2020-03-16 ENCOUNTER — Encounter: Payer: Self-pay | Admitting: Family

## 2020-03-16 MED ORDER — CALCIUM CARBONATE-VITAMIN D 600-400 MG-UNIT PO TABS: 1.0000 | ORAL_TABLET | Freq: Two times a day (BID) | ORAL | Status: DC

## 2020-03-16 MED ORDER — ALENDRONATE SODIUM 70 MG PO TABS: 70.0000 mg | ORAL_TABLET | ORAL | 4 refills | Status: DC

## 2020-03-17 ENCOUNTER — Encounter: Payer: Self-pay | Admitting: Family

## 2020-03-17 DIAGNOSIS — M25519 Pain in unspecified shoulder: Secondary | ICD-10-CM

## 2020-03-19 ENCOUNTER — Other Ambulatory Visit: Payer: Self-pay | Admitting: Cardiology

## 2020-03-19 ENCOUNTER — Encounter: Payer: Self-pay | Admitting: Family

## 2020-03-20 MED ORDER — LISINOPRIL 10 MG PO TABS: 10.0000 mg | ORAL_TABLET | Freq: Every day | ORAL | 0 refills | Status: DC

## 2020-03-20 MED ORDER — FUROSEMIDE 80 MG PO TABS: 80.0000 mg | ORAL_TABLET | Freq: Every day | ORAL | 1 refills | Status: DC

## 2020-03-20 MED ORDER — OMEPRAZOLE 20 MG PO CPDR: 20.0000 mg | DELAYED_RELEASE_CAPSULE | Freq: Every day | ORAL | 1 refills | Status: DC

## 2020-03-20 MED ORDER — OMEPRAZOLE 20 MG PO CPDR: 20.0000 mg | DELAYED_RELEASE_CAPSULE | Freq: Every day | ORAL | 0 refills | Status: DC

## 2020-03-20 MED ORDER — ROSUVASTATIN CALCIUM 20 MG PO TABS: 20.0000 mg | ORAL_TABLET | Freq: Every day | ORAL | 0 refills | Status: DC

## 2020-03-20 MED ORDER — ROSUVASTATIN CALCIUM 20 MG PO TABS: 20.0000 mg | ORAL_TABLET | Freq: Every day | ORAL | 1 refills | Status: DC

## 2020-03-20 MED ORDER — FUROSEMIDE 80 MG PO TABS: 80.0000 mg | ORAL_TABLET | Freq: Every day | ORAL | 0 refills | Status: DC

## 2020-03-20 MED ORDER — LISINOPRIL 10 MG PO TABS: 10.0000 mg | ORAL_TABLET | Freq: Every day | ORAL | 1 refills | Status: DC

## 2020-04-02 ENCOUNTER — Ambulatory Visit (HOSPITAL_BASED_OUTPATIENT_CLINIC_OR_DEPARTMENT_OTHER)
Admission: RE | Admit: 2020-04-02 | Discharge: 2020-04-02 | Disposition: A | Discharge status: Home / Self Care | Payer: Medicare Other | Source: Ambulatory Visit | Attending: Cardiology | Admitting: Cardiology

## 2020-04-02 ENCOUNTER — Other Ambulatory Visit: Payer: Self-pay

## 2020-04-02 DIAGNOSIS — R600 Localized edema: Secondary | ICD-10-CM

## 2020-04-02 DIAGNOSIS — I447 Left bundle-branch block, unspecified: Secondary | ICD-10-CM

## 2020-04-02 DIAGNOSIS — I10 Essential (primary) hypertension: Secondary | ICD-10-CM | POA: Diagnosis not present

## 2020-04-02 DIAGNOSIS — E782 Mixed hyperlipidemia: Secondary | ICD-10-CM

## 2020-04-04 LAB — ECHOCARDIOGRAM COMPLETE
Area-P 1/2: 4.96 cm2
Single Plane A4C EF: 54 %

## 2020-04-05 ENCOUNTER — Telehealth: Payer: Self-pay

## 2020-04-05 NOTE — Telephone Encounter (Signed)
-----   Message from Baldo Daub, MD sent at 04/05/2020  7:50 AM EST ----- This is a good result, combined with the laboratory test BNP no findings of heart failure with her edema.

## 2020-04-05 NOTE — Telephone Encounter (Signed)
Spoke with patient regarding results and recommendation.  Patient verbalizes understanding and is agreeable to plan of care. Advised patient to call back with any issues or concerns.  

## 2020-04-09 ENCOUNTER — Other Ambulatory Visit: Payer: Self-pay | Admitting: Cardiology

## 2020-04-09 DIAGNOSIS — I10 Essential (primary) hypertension: Secondary | ICD-10-CM | POA: Diagnosis not present

## 2020-04-09 DIAGNOSIS — R600 Localized edema: Secondary | ICD-10-CM | POA: Diagnosis not present

## 2020-04-09 DIAGNOSIS — I447 Left bundle-branch block, unspecified: Secondary | ICD-10-CM | POA: Diagnosis not present

## 2020-04-09 DIAGNOSIS — E782 Mixed hyperlipidemia: Secondary | ICD-10-CM | POA: Diagnosis not present

## 2020-04-10 ENCOUNTER — Telehealth: Payer: Self-pay

## 2020-04-10 LAB — BASIC METABOLIC PANEL
BUN/Creatinine Ratio: 19 (ref 12–28)
BUN: 18 mg/dL (ref 8–27)
CO2: 23 mmol/L (ref 20–29)
Calcium: 10.3 mg/dL (ref 8.7–10.3)
Chloride: 99 mmol/L (ref 96–106)
Creatinine, Ser: 0.94 mg/dL (ref 0.57–1.00)
GFR calc Af Amer: 68 mL/min/{1.73_m2} (ref >59)
GFR calc non Af Amer: 59 mL/min/{1.73_m2} — ABNORMAL LOW (ref >59)
Glucose: 89 mg/dL (ref 65–99)
Potassium: 4.1 mmol/L (ref 3.5–5.2)
Sodium: 139 mmol/L (ref 134–144)

## 2020-04-10 NOTE — Telephone Encounter (Signed)
Spoke with patient regarding results and recommendation.  Patient verbalizes understanding and is agreeable to plan of care. Advised patient to call back with any issues or concerns.  

## 2020-04-11 ENCOUNTER — Telehealth (HOSPITAL_COMMUNITY): Payer: Self-pay | Admitting: Emergency Medicine

## 2020-04-11 NOTE — Telephone Encounter (Signed)
Attempted to call patient regarding upcoming cardiac CT appointment. °Left message on voicemail with name and callback number °Brunilda Eble RN Navigator Cardiac Imaging °[facility] °[phone] Office °[phone] Cell ° °

## 2020-04-12 ENCOUNTER — Other Ambulatory Visit: Payer: Self-pay

## 2020-04-12 ENCOUNTER — Ambulatory Visit (HOSPITAL_COMMUNITY)
Admission: RE | Admit: 2020-04-12 | Discharge: 2020-04-12 | Disposition: A | Discharge status: Home / Self Care | Payer: Medicare Other | Source: Ambulatory Visit | Attending: Cardiology | Admitting: Cardiology

## 2020-04-12 DIAGNOSIS — R931 Abnormal findings on diagnostic imaging of heart and coronary circulation: Secondary | ICD-10-CM | POA: Insufficient documentation

## 2020-04-12 DIAGNOSIS — I2510 Atherosclerotic heart disease of native coronary artery without angina pectoris: Secondary | ICD-10-CM | POA: Insufficient documentation

## 2020-04-12 DIAGNOSIS — I447 Left bundle-branch block, unspecified: Secondary | ICD-10-CM | POA: Insufficient documentation

## 2020-04-12 DIAGNOSIS — I700 Atherosclerosis of aorta: Secondary | ICD-10-CM | POA: Insufficient documentation

## 2020-04-12 MED ORDER — IOHEXOL 350 MG/ML SOLN
80.0000 mL | Freq: Once | INTRAVENOUS | Status: AC | PRN
  Administered 2020-04-12: 80 mL via INTRAVENOUS

## 2020-04-12 MED ORDER — NITROGLYCERIN 0.4 MG SL SUBL
SUBLINGUAL_TABLET | SUBLINGUAL | Status: AC
  Administered 2020-04-12: 0.8 mg via SUBLINGUAL
  Filled 2020-04-12: qty 2

## 2020-04-12 MED ORDER — NITROGLYCERIN 0.4 MG SL SUBL: 0.8000 mg | SUBLINGUAL_TABLET | SUBLINGUAL | Status: DC | PRN

## 2020-04-13 ENCOUNTER — Telehealth: Payer: Self-pay

## 2020-04-13 DIAGNOSIS — R931 Abnormal findings on diagnostic imaging of heart and coronary circulation: Secondary | ICD-10-CM | POA: Diagnosis not present

## 2020-04-13 DIAGNOSIS — I2510 Atherosclerotic heart disease of native coronary artery without angina pectoris: Secondary | ICD-10-CM | POA: Diagnosis not present

## 2020-04-13 DIAGNOSIS — I700 Atherosclerosis of aorta: Secondary | ICD-10-CM | POA: Diagnosis not present

## 2020-04-13 NOTE — Telephone Encounter (Signed)
-----   Message from Baldo Daub, MD sent at 04/13/2020 12:19 PM EST ----- Her calcium score is moderately elevated remain on rosuvastatin.  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 At this time I do not think she will require heart catheterization or further procedures  Her echocardiogram was reassuring heart muscle function was strong.  Patient should have an appointment for follow-up with me.

## 2020-04-13 NOTE — Telephone Encounter (Signed)
Left message on patients voicemail to please return our call.   

## 2020-04-16 ENCOUNTER — Telehealth: Payer: Self-pay

## 2020-04-16 DIAGNOSIS — M19012 Primary osteoarthritis, left shoulder: Secondary | ICD-10-CM | POA: Diagnosis not present

## 2020-04-16 NOTE — Telephone Encounter (Signed)
Patient returning call.

## 2020-04-16 NOTE — Telephone Encounter (Signed)
Left message on patients voicemail to please return our call.   

## 2020-04-16 NOTE — Telephone Encounter (Signed)
-----   Message from Brian J Munley, MD sent at 04/13/2020 12:19 PM EST ----- Her calcium score is moderately elevated remain on rosuvastatin.  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 At this time I do not think she will require heart catheterization or further procedures  Her echocardiogram was reassuring heart muscle function was strong.  Patient should have an appointment for follow-up with me. 

## 2020-04-16 NOTE — Telephone Encounter (Signed)
Spoke with patient regarding results and recommendation.  Patient verbalizes understanding and is agreeable to plan of care. Advised patient to call back with any issues or concerns.  

## 2020-04-28 NOTE — Progress Notes (Signed)
Cardiology Office Note:    Date:  04/30/2020   ID:  Michele Williamson, DOB 1941-02-21, MRN [id number]  PCP:  Sandford Craze, NP  Cardiologist:  Norman Herrlich, MD    Referring MD: Sandford Craze, NP    ASSESSMENT:    1. Coronary artery disease involving native coronary artery of native heart without angina pectoris   2. Benign essential hypertension   3. Agatston coronary artery calcium score less than 100   4. LBBB (left bundle branch block)   5. Mixed hyperlipidemia    PLAN:    In order of problems listed above:  1. We had nice opportunity to review her testing she has CAD single-vessel nonflow limiting and is on appropriate therapy aspirin antihypertensive high intensity statin at this time asymptomatic New York Heart Association class I.  She is reassured. 2. Stable BP at target continue current treatment including a loop diuretic with chronic lower extremity edema 3. Moderate elevation of her calcium score continue high intensity statin with CAD 4. Stable EKG pattern without cardiomyopathy or heart failure 5. Treated LDL at target continue high intensity statin   Next appointment: 1 year   Medication Adjustments/Labs and Tests Ordered: Current medicines are reviewed at length with the patient today.  Concerns regarding medicines are outlined above.  Orders Placed This Encounter  Procedures  . EKG 12-Lead   No orders of the defined types were placed in this encounter.  Chief complaint: Follow-up after cardiac testing  History of Present Illness:    Michele Williamson is a 80 y.o. female with a hx of chronic lower extremity edema hypertension hyperlipidemia and left bundle branch block last seen 03/09/2020.  Compliance with diet, lifestyle and medications: Yes  She has good healthcare literacy she has looked at the reports online and had spoken with her son who is a Marine scientist in [place]. Her findings do not show evidence of heart failure or cardiomyopathy  proBNP level echocardiogram. She has CAD not severe flow-limiting her left anterior descending coronary artery is on appropriate therapy with aspirin antihypertensives and a high intensity statin. She is having no anginal discomfort shortness of breath palpitation and has chronic lower extremity edema.  Following her visit she underwent echocardiogram performed 04/02/2020 the left ventricle was normal size no hypertrophy EF 60 to 65% and normal diastolic filling pressure.  The right ventricle was normal there is no significant valvular abnormality. proBNP level was quite low and consistent with heart failure.  153. Cardiac CTA reported 04/12/2020 showed a calcium score of 87.7 53rd percentile for age and sex moderate CAD was present with tandem proximal lesions LAD first 25 to 49% second 50 to 69%.  Fractional flow reserve left anterior descending coronary artery was normal. Past Medical History:  Diagnosis Date  . Hyperlipemia   . Hypertension     Past Surgical History:  Procedure Laterality Date  . BUNIONECTOMY Right   . CARPAL TUNNEL RELEASE    . CATARACT EXTRACTION    . CESAREAN SECTION    . CHOLECYSTECTOMY  2001  . ROTATOR CUFF REPAIR      Current Medications: Current Meds  Medication Sig  . alendronate (FOSAMAX) 70 MG tablet Take 1 tablet (70 mg total) by mouth every 7 (seven) days. Take with a full glass of water on an empty stomach.  Marland Kitchen aspirin EC 81 MG tablet Take 1 tablet (81 mg total) by mouth daily. Swallow whole.  . Calcium Carbonate-Vitamin D 600-400 MG-UNIT tablet Take 1 tablet by mouth 2 (two)  times daily. (Patient taking differently: Take 1 tablet by mouth daily.)  . furosemide (LASIX) 80 MG tablet Take 1 tablet (80 mg total) by mouth daily.  Marland Kitchen lisinopril (ZESTRIL) 10 MG tablet Take 1 tablet (10 mg total) by mouth daily.  Marland Kitchen loratadine (CLARITIN) 10 MG tablet Take 10 mg by mouth daily. [employer]  . omeprazole (PRILOSEC) 20 MG capsule Take 1 capsule (20 mg total) by  mouth daily.  . rosuvastatin (CRESTOR) 20 MG tablet Take 1 tablet (20 mg total) by mouth at bedtime.     Allergies:   Penicillins, Pregabalin, and Tramadol   Social History   Socioeconomic History  . Marital status: Widowed    Spouse name: Not on file  . Number of children: Not on file  . Years of education: Not on file  . Highest education level: Not on file  Occupational History  . Not on file  Tobacco Use  . Smoking status: Never Smoker  . Smokeless tobacco: Never Used  Vaping Use  . Vaping Use: Never used  Substance and Sexual Activity  . Alcohol use: No  . Drug use: No  . Sexual activity: Yes    Birth control/protection: None  Other Topics Concern  . Not on file  Social History Narrative   Lives in [place]   Has an adopted son born 2008/07/06   Husband died in Jul 07, 2006   Retired, worked as an Insurance underwriter   Enjoys senior center   Has a Veterinary surgeon   Completed HS   Social Determinants of Corporate investment banker Strain: Not on [employer] Insecurity: Not on file  Transportation Needs: Not on file  Physical Activity: Inactive  . Days of Exercise per Week: 0 days  . Minutes of Exercise per Session: 0 min  Stress: Not on file  Social Connections: Not on file     Family History: The patient's family history includes Bone cancer in her father; Heart disease in her mother; Hyperlipidemia in her father; Hypertension in her father and mother; Melanoma in her brother; Obesity in her son; Stomach cancer in her maternal grandmother. ROS:   Please see the history of present illness.    All other systems reviewed and are negative.  EKGs/Labs/Other Studies Reviewed:    The following studies were reviewed today:  EKG:  EKG ordered today and personally reviewed.  The ekg ordered today demonstrates sinus rhythm left bundle branch block QRS duration is relatively narrow at 120 ms  Recent Labs: 03/06/2020: ALT 14; TSH 1.05 03/09/2020: NT-Pro BNP 153 04/09/2020: BUN 18;  Creatinine, Ser 0.94; Potassium 4.1; Sodium 139  Recent Lipid Panel    Component Value Date/Time   CHOL 144 03/06/2020 0900   TRIG 213.0 (H) 03/06/2020 0900   HDL 41.60 03/06/2020 0900   CHOLHDL 3 03/06/2020 0900   VLDL 42.6 (H) 03/06/2020 0900   LDLDIRECT 70.0 03/06/2020 0900    Physical Exam:    VS:  BP (!) 120/58   Pulse 72   Ht 5\' 3"  (1.6 m)   Wt 165 lb 1.9 oz (74.9 kg)   SpO2 95%   BMI 29.25 kg/m     Wt Readings from Last 3 Encounters:  04/30/20 165 lb 1.9 oz (74.9 kg)  03/09/20 165 lb (74.8 kg)  03/06/20 165 lb (74.8 kg)     GEN:  Well nourished, well developed in no acute distress HEENT: Normal NECK: No JVD; No carotid bruits LYMPHATICS: No lymphadenopathy CARDIAC: Second heart sound is paradoxical  RRR, no murmurs, rubs, gallops RESPIRATORY:  Clear to auscultation without rales, wheezing or rhonchi  ABDOMEN: Soft, non-tender, non-distended MUSCULOSKELETAL:  No edema; No deformity  SKIN: Warm and dry NEUROLOGIC:  Alert and oriented x 3 PSYCHIATRIC:  Normal affect    Signed, Norman Herrlich, MD  04/30/2020 11:56 AM    [facility]

## 2020-04-30 ENCOUNTER — Encounter: Payer: Self-pay | Admitting: Cardiology

## 2020-04-30 ENCOUNTER — Ambulatory Visit: Payer: Medicare Other | Admitting: Cardiology

## 2020-04-30 ENCOUNTER — Other Ambulatory Visit: Payer: Self-pay

## 2020-04-30 VITALS — BP 120/58 | HR 72 | Ht 63.0 in | Wt 165.1 lb

## 2020-04-30 DIAGNOSIS — I447 Left bundle-branch block, unspecified: Secondary | ICD-10-CM | POA: Diagnosis not present

## 2020-04-30 DIAGNOSIS — I10 Essential (primary) hypertension: Secondary | ICD-10-CM

## 2020-04-30 DIAGNOSIS — I2510 Atherosclerotic heart disease of native coronary artery without angina pectoris: Secondary | ICD-10-CM

## 2020-04-30 DIAGNOSIS — E782 Mixed hyperlipidemia: Secondary | ICD-10-CM | POA: Diagnosis not present

## 2020-04-30 DIAGNOSIS — R931 Abnormal findings on diagnostic imaging of heart and coronary circulation: Secondary | ICD-10-CM | POA: Diagnosis not present

## 2020-04-30 NOTE — Patient Instructions (Signed)
Medication Instructions:  No medication changes. *If you need a refill on your cardiac medications before your next appointment, please call your pharmacy*   Lab Work: None ordered If you have labs (blood work) drawn today and your tests are completely normal, you will receive your results only by: . MyChart Message (if you have MyChart) OR . A paper copy in the mail If you have any lab test that is abnormal or we need to change your treatment, we will call you to review the results.   Testing/Procedures: None ordered   Follow-Up: At [facility], you and your health needs are our priority.  As part of our continuing mission to provide you with exceptional heart care, we have created designated Provider Care Teams.  These Care Teams include your primary Cardiologist (physician) and Advanced Practice Providers (APPs -  Physician Assistants and Nurse Practitioners) who all work together to provide you with the care you need, when you need it.  We recommend signing up for the patient portal called "MyChart".  Sign up information is provided on this After Visit Summary.  MyChart is used to connect with patients for Virtual Visits (Telemedicine).  Patients are able to view lab/test results, encounter notes, upcoming appointments, etc.  Non-urgent messages can be sent to your provider as well.   To learn more about what you can do with MyChart, go to [email or web address].    Your next appointment:   12 month(s)  The format for your next appointment:   In Person  Provider:   Brian Munley, MD   Other Instructions NA  

## 2020-05-14 DIAGNOSIS — Z1231 Encounter for screening mammogram for malignant neoplasm of breast: Secondary | ICD-10-CM | POA: Diagnosis not present

## 2020-05-14 LAB — HM MAMMOGRAPHY

## 2020-05-17 ENCOUNTER — Encounter: Payer: Self-pay | Admitting: Family

## 2020-06-22 NOTE — Patient Instructions (Addendum)
DUE TO COVID-19 ONLY ONE VISITOR IS ALLOWED TO COME WITH YOU AND STAY IN THE WAITING ROOM ONLY DURING PRE OP AND PROCEDURE DAY OF SURGERY. THE 1 VISITOR  MAY VISIT WITH YOU AFTER SURGERY IN YOUR PRIVATE ROOM DURING VISITING HOURS ONLY!  YOU NEED TO HAVE A COVID 19 TEST ON_4/26______ @2 :40 pm_______, THIS TEST MUST BE DONE BEFORE SURGERY,  COVID TESTING SITE [address] [place] , IT IS ON THE RIGHT GOING OUT [address] APPROXIMATELY  2 MINUTES PAST [employer] ON THE RIGHT. ONCE YOUR COVID TEST IS COMPLETED,  PLEASE BEGIN THE QUARANTINE INSTRUCTIONS AS OUTLINED IN YOUR HANDOUT.                [id number]   Your procedure is scheduled on: 06/28/20   Report to [facility] Main  Entrance   Report to short stay at 5:15 AM     Call this number if you have problems the morning of surgery [phone]     BRUSH YOUR TEETH MORNING OF SURGERY AND RINSE YOUR MOUTH OUT, NO CHEWING GUM CANDY OR MINTS.   No food after midnight.    You may have clear liquid until 4:30 AM.    At 4:00 AM drink pre surgery drink.   Nothing by mouth after 4:30 AM  .  Take these medicines the morning of surgery with A SIP OF WATER: Omeprazole                                 You may not have any metal on your body including hair pins and              piercings  Do not wear jewelry, make-up, lotions, powders or perfumes, deodorant             Do not wear nail polish on your fingernails.  Do not shave  48 hours prior to surgery.     Do not bring valuables to the hospital. [facility] IS NOT             RESPONSIBLE   FOR VALUABLES.  Contacts, dentures or bridgework may not be worn into surgery.      Patients discharged the day of surgery will not be allowed to drive home.   IF YOU ARE HAVING SURGERY AND GOING HOME THE SAME DAY, YOU MUST HAVE AN ADULT TO DRIVE YOU HOME AND BE WITH YOU FOR 24 HOURS.  YOU MAY GO HOME BY TAXI OR UBER OR ORTHERWISE, BUT AN ADULT MUST  ACCOMPANY YOU HOME AND STAY WITH YOU FOR 24 HOURS.  Name and phone number of your driver:  Special Instructions: N/A              Please read over the following fact sheets you were given: _____________________________________________________________________             [facility]- Preparing for Total Shoulder Arthroplasty    Before surgery, you can play an important role. Because skin is not sterile, your skin needs to be as free of germs as possible. You can reduce the number of germs on your skin by using the following products. . Benzoyl Peroxide Gel o Reduces the number of germs present on the skin o Applied twice a day to shoulder area starting two days before surgery    ==================================================================  Please follow these instructions carefully:  BENZOYL PEROXIDE 5% GEL  Please do not use if you have an allergy to benzoyl peroxide.   If your skin becomes reddened/irritated stop using the benzoyl peroxide.  Starting two days before surgery, apply as follows: 1. Apply benzoyl peroxide in the morning and at night. Apply after taking a shower. If you are not taking a shower clean entire shoulder front, back, and side along with the armpit with a clean wet washcloth.  2. Place a quarter-sized dollop on your shoulder and rub in thoroughly, making sure to cover the front, back, and side of your shoulder, along with the armpit.   2 days before ____ AM   ____ PM              1 day before ____ AM   ____ PM                         3. Do this twice a day for two days.  (Last application is the night before surgery, AFTER using the CHG soap as described below).  4. Do NOT apply benzoyl peroxide gel on the day of surgery.   [facility] - Preparing for Surgery Before surgery, you can play an important role.  Because skin is not sterile, your skin needs to be as free of germs as possible.  You can reduce the number of germs on your skin by washing with  CHG (chlorahexidine gluconate) soap before surgery.  CHG is an antiseptic cleaner which kills germs and bonds with the skin to continue killing germs even after washing. Please DO NOT use if you have an allergy to CHG or antibacterial soaps.  If your skin becomes reddened/irritated stop using the CHG and inform your nurse when you arrive at Short Stay. Do not shave (including legs and underarms) for at least 48 hours prior to the first CHG shower.   Please follow these instructions carefully:  1.  Shower with CHG Soap the night before surgery and the  morning of Surgery.  2.  If you choose to wash your hair, wash your hair first as usual with your  normal  shampoo.  3.  After you shampoo, rinse your hair and body thoroughly to remove the  shampoo.                                        4.  Use CHG as you would any other liquid soap.  You can apply chg directly  to the skin and wash                       Gently with a scrungie or clean washcloth.  5.  Apply the CHG Soap to your body ONLY FROM THE NECK DOWN.   Do not use on face/ open                           Wound or open sores. Avoid contact with eyes, ears mouth and genitals (private parts).                       Wash face,  Genitals (private parts) with your normal soap.             6.  Wash thoroughly, paying special attention to the area where your surgery  will be performed.  7.  Thoroughly rinse your body with warm water from the neck down.  8.  DO NOT shower/wash with your normal soap after using and rinsing off  the CHG Soap.             9.  Pat yourself dry with a clean towel.            10.  Wear clean pajamas.            11.  Place clean sheets on your bed the night of your first shower and do not  sleep with pets. Day of Surgery : Do not apply any lotions/deodorants the morning of surgery.  Please wear clean clothes to the hospital/surgery center.  FAILURE TO FOLLOW THESE INSTRUCTIONS MAY RESULT IN THE CANCELLATION OF YOUR  SURGERY PATIENT SIGNATURE_________________________________  NURSE SIGNATURE__________________________________  ________________________________________________________________________   Rogelia Mire  An incentive spirometer is a tool that can help keep your lungs clear and active. This tool measures how well you are filling your lungs with each breath. Taking long deep breaths may help reverse or decrease the chance of developing breathing (pulmonary) problems (especially infection) following:  A long period of time when you are unable to move or be active. BEFORE THE PROCEDURE   If the spirometer includes an indicator to show your best effort, your nurse or respiratory therapist will set it to a desired goal.  If possible, sit up straight or lean slightly forward. Try not to slouch.  Hold the incentive spirometer in an upright position. INSTRUCTIONS FOR USE  1. Sit on the edge of your bed if possible, or sit up as far as you can in bed or on a chair. 2. Hold the incentive spirometer in an upright position. 3. Breathe out normally. 4. Place the mouthpiece in your mouth and seal your lips tightly around it. 5. Breathe in slowly and as deeply as possible, raising the piston or the ball toward the top of the column. 6. Hold your breath for 3-5 seconds or for as long as possible. Allow the piston or ball to fall to the bottom of the column. 7. Remove the mouthpiece from your mouth and breathe out normally. 8. Rest for a few seconds and repeat Steps 1 through 7 at least 10 times every 1-2 hours when you are awake. Take your time and take a few normal breaths between deep breaths. 9. The spirometer may include an indicator to show your best effort. Use the indicator as a goal to work toward during each repetition. 10. After each set of 10 deep breaths, practice coughing to be sure your lungs are clear. If you have an incision (the cut made at the time of surgery), support your  incision when coughing by placing a pillow or rolled up towels firmly against it. Once you are able to get out of bed, walk around indoors and cough well. You may stop using the incentive spirometer when instructed by your caregiver.  RISKS AND COMPLICATIONS  Take your time so you do not get dizzy or light-headed.  If you are in pain, you may need to take or ask for pain medication before doing incentive spirometry. It is harder to take a deep breath if you are having pain. AFTER USE  Rest and breathe slowly and easily.  It can be helpful to keep track of a log of your progress. Your caregiver can provide you with a simple table to help with this. If you are using the spirometer  at home, follow these instructions: [place] IF:   You are having difficultly using the spirometer.  You have trouble using the spirometer as often as instructed.  Your pain medication is not giving enough relief while using the spirometer.  You develop fever of 100.5 F (38.1 C) or higher. SEEK IMMEDIATE MEDICAL CARE IF:   You cough up bloody sputum that had not been present before.  You develop fever of 102 F (38.9 C) or greater.  You develop worsening pain at or near the incision site. MAKE SURE YOU:   Understand these instructions.  Will watch your condition.  Will get help right away if you are not doing well or get worse. Document Released: 06/30/2006 Document Revised: 05/12/2011 Document Reviewed: 08/31/2006 [facility] Patient Information 2014 [place], [place].   ________________________________________________________________________

## 2020-06-25 ENCOUNTER — Other Ambulatory Visit (HOSPITAL_COMMUNITY): Payer: Medicare Other

## 2020-06-25 ENCOUNTER — Encounter (HOSPITAL_COMMUNITY)
Admission: RE | Admit: 2020-06-25 | Discharge: 2020-06-25 | Disposition: A | Discharge status: Home / Self Care | Payer: Medicare Other | Source: Ambulatory Visit | Attending: Orthopedic Surgery | Admitting: Orthopedic Surgery

## 2020-06-25 ENCOUNTER — Encounter (HOSPITAL_COMMUNITY): Payer: Self-pay

## 2020-06-25 ENCOUNTER — Other Ambulatory Visit: Payer: Self-pay

## 2020-06-25 DIAGNOSIS — Z01812 Encounter for preprocedural laboratory examination: Secondary | ICD-10-CM | POA: Insufficient documentation

## 2020-06-25 HISTORY — DX: Other specified postprocedural states: R11.2

## 2020-06-25 HISTORY — DX: Other specified postprocedural states: Z98.890

## 2020-06-25 HISTORY — DX: Cardiac murmur, unspecified: R01.1

## 2020-06-25 HISTORY — DX: Unspecified osteoarthritis, unspecified site: M19.90

## 2020-06-25 HISTORY — DX: Nausea with vomiting, unspecified: R11.2

## 2020-06-25 HISTORY — DX: Other complications of anesthesia, initial encounter: T88.59XA

## 2020-06-25 LAB — SURGICAL PCR SCREEN
MRSA, PCR: NEGATIVE
Staphylococcus aureus: NEGATIVE

## 2020-06-25 LAB — BASIC METABOLIC PANEL
Anion gap: 11 (ref 5–15)
BUN: 21 mg/dL (ref 8–23)
CO2: 26 mmol/L (ref 22–32)
Calcium: 10.4 mg/dL — ABNORMAL HIGH (ref 8.9–10.3)
Chloride: 107 mmol/L (ref 98–111)
Creatinine, Ser: 1 mg/dL (ref 0.44–1.00)
GFR, Estimated: 58 mL/min — ABNORMAL LOW (ref >60)
Glucose, Bld: 121 mg/dL — ABNORMAL HIGH (ref 70–99)
Potassium: 4.3 mmol/L (ref 3.5–5.1)
Sodium: 144 mmol/L (ref 135–145)

## 2020-06-25 LAB — CBC
HCT: 41.8 % (ref 36.0–46.0)
Hemoglobin: 13.9 g/dL (ref 12.0–15.0)
MCH: 31.6 pg (ref 26.0–34.0)
MCHC: 33.3 g/dL (ref 30.0–36.0)
MCV: 95 fL (ref 80.0–100.0)
Platelets: 234 10*3/uL (ref 150–400)
RBC: 4.4 MIL/uL (ref 3.87–5.11)
RDW: 12.7 % (ref 11.5–15.5)
WBC: 7.4 10*3/uL (ref 4.0–10.5)
nRBC: 0 % (ref 0.0–0.2)

## 2020-06-25 NOTE — Progress Notes (Signed)
COVID Vaccine Completed:Yes Date COVID Vaccine completed:04/12/19-boosters 12/28/19, 06/13/20 COVID vaccine manufacturer: Pfizer    Moderna   [employer]   PCP - Sandford Craze NP Cardiologist - Dr B. Munley  Chest x-ray - no EKG - 04/30/20-epic Stress Test - no ECHO - 04/02/20-epic Cardiac Cath - no Pacemaker/ICD device last checked:NA  Sleep Study - no CPAP -   Fasting Blood Sugar - NA Checks Blood Sugar _____ times a day  Blood Thinner Instructions:ASA81/ PCP Aspirin Instructions:stop 5 days prior to DOS/ Supple Last Dose:06/25/20  Anesthesia review:   Patient denies shortness of breath, fever, cough and chest pain at PAT appointment Yes. No SOB with any activities  Patient verbalized understanding of instructions that were given to them at the PAT appointment. Patient was also instructed that they will need to review over the PAT instructions again at home before surgery.Yes

## 2020-06-26 ENCOUNTER — Other Ambulatory Visit (HOSPITAL_COMMUNITY)
Admission: RE | Admit: 2020-06-26 | Discharge: 2020-06-26 | Disposition: A | Discharge status: Home / Self Care | Payer: Medicare Other | Source: Ambulatory Visit | Attending: Orthopedic Surgery | Admitting: Orthopedic Surgery

## 2020-06-26 DIAGNOSIS — Z01812 Encounter for preprocedural laboratory examination: Secondary | ICD-10-CM | POA: Diagnosis not present

## 2020-06-26 DIAGNOSIS — Z20822 Contact with and (suspected) exposure to covid-19: Secondary | ICD-10-CM | POA: Diagnosis not present

## 2020-06-27 LAB — SARS CORONAVIRUS 2 (TAT 6-24 HRS): SARS Coronavirus 2: NEGATIVE

## 2020-06-27 NOTE — Anesthesia Preprocedure Evaluation (Addendum)
Anesthesia Evaluation  Patient identified by MRN, date of birth, ID band Patient awake    Reviewed: Allergy & Precautions, NPO status , Patient's Chart, lab work & pertinent test results  History of Anesthesia Complications (+) PONV  Airway Mallampati: II  TM Distance: >3 FB Neck ROM: Full    Dental  (+) Teeth Intact   Pulmonary neg pulmonary ROS,    Pulmonary exam normal        Cardiovascular hypertension, Pt. on medications  Rhythm:Regular Rate:Normal     Neuro/Psych Depression negative neurological ROS     GI/Hepatic Neg liver ROS, GERD  Medicated,  Endo/Other  negative endocrine ROS  Renal/GU negative Renal ROS  negative genitourinary   Musculoskeletal  (+) Arthritis , Osteoarthritis,    Abdominal (+)  Abdomen: soft. Bowel sounds: normal.  Peds  Hematology negative hematology ROS (+)   Anesthesia Other Findings   Reproductive/Obstetrics                            Anesthesia Physical Anesthesia Plan  ASA: II  Anesthesia Plan: General and Regional   Post-op Pain Management:  Regional for Post-op pain   Induction: Intravenous  PONV Risk Score and Plan: 4 or greater and Ondansetron, Dexamethasone, Midazolam, Aprepitant and Propofol infusion  Airway Management Planned: Mask and Oral ETT  Additional Equipment: None  Intra-op Plan:   Post-operative Plan: Extubation in OR  Informed Consent: I have reviewed the patients History and Physical, chart, labs and discussed the procedure including the risks, benefits and alternatives for the proposed anesthesia with the patient or authorized representative who has indicated his/her understanding and acceptance.     Dental advisory given  Plan Discussed with: CRNA  Anesthesia Plan Comments: (Lab Results      Component                Value               Date                      WBC                      7.4                  06/25/2020                HGB                      13.9                06/25/2020                HCT                      41.8                06/25/2020                MCV                      95.0                06/25/2020                PLT  234                 06/25/2020           Lab Results      Component                Value               Date                      NA                       144                 06/25/2020                K                        4.3                 06/25/2020                CO2                      26                  06/25/2020                GLUCOSE                  121 (H)             06/25/2020                BUN                      21                  06/25/2020                CREATININE               1.00                06/25/2020                CALCIUM                  10.4 (H)            06/25/2020                GFRNONAA                 58 (L)              06/25/2020                GFRAA                    68                  04/09/2020          )       Anesthesia Quick Evaluation

## 2020-06-28 ENCOUNTER — Encounter (HOSPITAL_COMMUNITY): Payer: Self-pay | Admitting: Orthopedic Surgery

## 2020-06-28 ENCOUNTER — Ambulatory Visit (HOSPITAL_COMMUNITY)
Admission: RE | Admit: 2020-06-28 | Discharge: 2020-06-28 | Disposition: A | Discharge status: Home / Self Care | Payer: Medicare Other | Attending: Orthopedic Surgery | Admitting: Orthopedic Surgery

## 2020-06-28 ENCOUNTER — Encounter (HOSPITAL_COMMUNITY)
Admission: RE | Disposition: A | Discharge status: Home / Self Care | Payer: Self-pay | Source: Home / Self Care | Attending: Orthopedic Surgery

## 2020-06-28 ENCOUNTER — Ambulatory Visit (HOSPITAL_COMMUNITY): Payer: Medicare Other | Admitting: Anesthesiology

## 2020-06-28 ENCOUNTER — Ambulatory Visit (HOSPITAL_COMMUNITY): Payer: Medicare Other | Admitting: Physician Assistant

## 2020-06-28 DIAGNOSIS — Z7983 Long term (current) use of bisphosphonates: Secondary | ICD-10-CM | POA: Insufficient documentation

## 2020-06-28 DIAGNOSIS — G8918 Other acute postprocedural pain: Secondary | ICD-10-CM | POA: Diagnosis not present

## 2020-06-28 DIAGNOSIS — E782 Mixed hyperlipidemia: Secondary | ICD-10-CM | POA: Diagnosis not present

## 2020-06-28 DIAGNOSIS — Z7982 Long term (current) use of aspirin: Secondary | ICD-10-CM | POA: Diagnosis not present

## 2020-06-28 DIAGNOSIS — I10 Essential (primary) hypertension: Secondary | ICD-10-CM | POA: Diagnosis not present

## 2020-06-28 DIAGNOSIS — Z79899 Other long term (current) drug therapy: Secondary | ICD-10-CM | POA: Diagnosis not present

## 2020-06-28 DIAGNOSIS — M19012 Primary osteoarthritis, left shoulder: Secondary | ICD-10-CM | POA: Diagnosis not present

## 2020-06-28 HISTORY — PX: REVERSE SHOULDER ARTHROPLASTY: SHX5054

## 2020-06-28 SURGERY — ARTHROPLASTY, SHOULDER, TOTAL, REVERSE
Anesthesia: Regional | Site: Shoulder | Laterality: Left

## 2020-06-28 MED ORDER — PROPOFOL 500 MG/50ML IV EMUL
INTRAVENOUS | Status: DC | PRN
  Administered 2020-06-28: 25 ug/kg/min via INTRAVENOUS

## 2020-06-28 MED ORDER — LACTATED RINGERS IV BOLUS: 500.0000 mL | Freq: Once | INTRAVENOUS | Status: DC

## 2020-06-28 MED ORDER — MIDAZOLAM HCL 2 MG/2ML IJ SOLN
INTRAMUSCULAR | Status: AC
  Filled 2020-06-28: qty 2

## 2020-06-28 MED ORDER — APREPITANT 40 MG PO CAPS
40.0000 mg | ORAL_CAPSULE | Freq: Once | ORAL | Status: AC
Start: 2020-06-28 — End: 2020-06-28
  Administered 2020-06-28: 40 mg via ORAL
  Filled 2020-06-28: qty 1

## 2020-06-28 MED ORDER — ONDANSETRON 4 MG PO TBDP: 4.0000 mg | ORAL_TABLET | Freq: Three times a day (TID) | ORAL | 0 refills | Status: DC | PRN

## 2020-06-28 MED ORDER — ORAL CARE MOUTH RINSE: 15.0000 mL | Freq: Once | OROMUCOSAL | Status: AC

## 2020-06-28 MED ORDER — PROPOFOL 10 MG/ML IV BOLUS
INTRAVENOUS | Status: DC | PRN
  Administered 2020-06-28: 120 mg via INTRAVENOUS

## 2020-06-28 MED ORDER — LACTATED RINGERS IV BOLUS
250.0000 mL | Freq: Once | INTRAVENOUS | Status: AC
  Administered 2020-06-28: 250 mL via INTRAVENOUS

## 2020-06-28 MED ORDER — METOCLOPRAMIDE HCL 5 MG/ML IJ SOLN: 5.0000 mg | Freq: Three times a day (TID) | INTRAMUSCULAR | Status: DC | PRN

## 2020-06-28 MED ORDER — VANCOMYCIN HCL 1000 MG IV SOLR
INTRAVENOUS | Status: DC | PRN
  Administered 2020-06-28: 1000 mg via TOPICAL

## 2020-06-28 MED ORDER — FENTANYL CITRATE (PF) 100 MCG/2ML IJ SOLN: 25.0000 ug | INTRAMUSCULAR | Status: DC | PRN

## 2020-06-28 MED ORDER — LACTATED RINGERS IV SOLN: INTRAVENOUS | Status: DC

## 2020-06-28 MED ORDER — EPHEDRINE 5 MG/ML INJ
INTRAVENOUS | Status: AC
  Filled 2020-06-28: qty 10

## 2020-06-28 MED ORDER — VANCOMYCIN HCL 1000 MG IV SOLR
INTRAVENOUS | Status: AC
  Filled 2020-06-28: qty 1000

## 2020-06-28 MED ORDER — MIDAZOLAM HCL 5 MG/5ML IJ SOLN
INTRAMUSCULAR | Status: DC | PRN
  Administered 2020-06-28: 1 mg via INTRAVENOUS

## 2020-06-28 MED ORDER — ONDANSETRON HCL 4 MG/2ML IJ SOLN
INTRAMUSCULAR | Status: AC
  Filled 2020-06-28: qty 2

## 2020-06-28 MED ORDER — NAPROXEN 500 MG PO TABS: 500.0000 mg | ORAL_TABLET | Freq: Two times a day (BID) | ORAL | 1 refills | Status: DC

## 2020-06-28 MED ORDER — ACETAMINOPHEN 10 MG/ML IV SOLN: 1000.0000 mg | Freq: Once | INTRAVENOUS | Status: DC | PRN

## 2020-06-28 MED ORDER — ONDANSETRON HCL 4 MG/2ML IJ SOLN
INTRAMUSCULAR | Status: DC | PRN
  Administered 2020-06-28 (×2): 4 mg via INTRAVENOUS

## 2020-06-28 MED ORDER — METOCLOPRAMIDE HCL 5 MG PO TABS
5.0000 mg | ORAL_TABLET | Freq: Three times a day (TID) | ORAL | Status: DC | PRN
  Filled 2020-06-28: qty 2

## 2020-06-28 MED ORDER — ROCURONIUM BROMIDE 10 MG/ML (PF) SYRINGE
PREFILLED_SYRINGE | INTRAVENOUS | Status: AC
  Filled 2020-06-28: qty 10

## 2020-06-28 MED ORDER — SODIUM CHLORIDE 0.9 % IR SOLN
Status: DC | PRN
  Administered 2020-06-28: 1000 mL

## 2020-06-28 MED ORDER — PROPOFOL 10 MG/ML IV BOLUS
INTRAVENOUS | Status: AC
  Filled 2020-06-28: qty 20

## 2020-06-28 MED ORDER — PHENYLEPHRINE 40 MCG/ML (10ML) SYRINGE FOR IV PUSH (FOR BLOOD PRESSURE SUPPORT)
PREFILLED_SYRINGE | INTRAVENOUS | Status: AC
  Filled 2020-06-28: qty 10

## 2020-06-28 MED ORDER — CHLORHEXIDINE GLUCONATE 0.12 % MT SOLN
15.0000 mL | Freq: Once | OROMUCOSAL | Status: AC
  Administered 2020-06-28: 15 mL via OROMUCOSAL

## 2020-06-28 MED ORDER — ROCURONIUM BROMIDE 100 MG/10ML IV SOLN
INTRAVENOUS | Status: DC | PRN
  Administered 2020-06-28: 60 mg via INTRAVENOUS

## 2020-06-28 MED ORDER — BUPIVACAINE LIPOSOME 1.3 % IJ SUSP
INTRAMUSCULAR | Status: DC | PRN
  Administered 2020-06-28: 10 mL

## 2020-06-28 MED ORDER — FENTANYL CITRATE (PF) 100 MCG/2ML IJ SOLN
INTRAMUSCULAR | Status: AC
  Filled 2020-06-28: qty 2

## 2020-06-28 MED ORDER — PROMETHAZINE HCL 25 MG PO TABS: 25.0000 mg | ORAL_TABLET | Freq: Four times a day (QID) | ORAL | 0 refills | Status: DC | PRN

## 2020-06-28 MED ORDER — DEXAMETHASONE SODIUM PHOSPHATE 10 MG/ML IJ SOLN
INTRAMUSCULAR | Status: AC
  Filled 2020-06-28: qty 1

## 2020-06-28 MED ORDER — DEXAMETHASONE SODIUM PHOSPHATE 10 MG/ML IJ SOLN
INTRAMUSCULAR | Status: DC | PRN
  Administered 2020-06-28: 5 mg via INTRAVENOUS

## 2020-06-28 MED ORDER — SUGAMMADEX SODIUM 200 MG/2ML IV SOLN
INTRAVENOUS | Status: DC | PRN
  Administered 2020-06-28: 200 mg via INTRAVENOUS

## 2020-06-28 MED ORDER — FENTANYL CITRATE (PF) 100 MCG/2ML IJ SOLN
INTRAMUSCULAR | Status: DC | PRN
  Administered 2020-06-28 (×2): 50 ug via INTRAVENOUS

## 2020-06-28 MED ORDER — AMISULPRIDE (ANTIEMETIC) 5 MG/2ML IV SOLN
10.0000 mg | Freq: Once | INTRAVENOUS | Status: AC | PRN
  Administered 2020-06-28: 10 mg via INTRAVENOUS

## 2020-06-28 MED ORDER — CYCLOBENZAPRINE HCL 10 MG PO TABS: 10.0000 mg | ORAL_TABLET | Freq: Three times a day (TID) | ORAL | 1 refills | Status: DC | PRN

## 2020-06-28 MED ORDER — AMISULPRIDE (ANTIEMETIC) 5 MG/2ML IV SOLN
INTRAVENOUS | Status: AC
  Filled 2020-06-28: qty 4

## 2020-06-28 MED ORDER — DIPHENHYDRAMINE HCL 50 MG/ML IJ SOLN
INTRAMUSCULAR | Status: AC
  Filled 2020-06-28: qty 1

## 2020-06-28 MED ORDER — BUPIVACAINE HCL (PF) 0.5 % IJ SOLN
INTRAMUSCULAR | Status: DC | PRN
  Administered 2020-06-28: 15 mL via PERINEURAL

## 2020-06-28 MED ORDER — CEFAZOLIN SODIUM-DEXTROSE 2-4 GM/100ML-% IV SOLN
2.0000 g | INTRAVENOUS | Status: AC
  Administered 2020-06-28: 2 g via INTRAVENOUS
  Filled 2020-06-28: qty 100

## 2020-06-28 MED ORDER — LIDOCAINE 2% (20 MG/ML) 5 ML SYRINGE
INTRAMUSCULAR | Status: AC
  Filled 2020-06-28: qty 5

## 2020-06-28 MED ORDER — ONDANSETRON HCL 4 MG/2ML IJ SOLN: 4.0000 mg | Freq: Four times a day (QID) | INTRAMUSCULAR | Status: DC | PRN

## 2020-06-28 MED ORDER — LIDOCAINE 2% (20 MG/ML) 5 ML SYRINGE
INTRAMUSCULAR | Status: DC | PRN
  Administered 2020-06-28: 40 mg via INTRAVENOUS

## 2020-06-28 MED ORDER — EPHEDRINE SULFATE-NACL 50-0.9 MG/10ML-% IV SOSY
PREFILLED_SYRINGE | INTRAVENOUS | Status: DC | PRN
  Administered 2020-06-28: 5 mg via INTRAVENOUS

## 2020-06-28 MED ORDER — OXYCODONE-ACETAMINOPHEN 5-325 MG PO TABS: 1.0000 | ORAL_TABLET | ORAL | 0 refills | Status: DC | PRN

## 2020-06-28 MED ORDER — PHENYLEPHRINE 40 MCG/ML (10ML) SYRINGE FOR IV PUSH (FOR BLOOD PRESSURE SUPPORT)
PREFILLED_SYRINGE | INTRAVENOUS | Status: DC | PRN
  Administered 2020-06-28 (×7): 40 ug via INTRAVENOUS

## 2020-06-28 MED ORDER — ONDANSETRON HCL 4 MG PO TABS
4.0000 mg | ORAL_TABLET | Freq: Four times a day (QID) | ORAL | Status: DC | PRN
  Filled 2020-06-28: qty 1

## 2020-06-28 MED ORDER — TRANEXAMIC ACID-NACL 1000-0.7 MG/100ML-% IV SOLN
1000.0000 mg | INTRAVENOUS | Status: AC
  Administered 2020-06-28: 1000 mg via INTRAVENOUS
  Filled 2020-06-28: qty 100

## 2020-06-28 SURGICAL SUPPLY — 61 items
BLADE SAW SGTL 83.5X18.5 (BLADE) ×2 IMPLANT
COOLER ICEMAN CLASSIC (MISCELLANEOUS) ×2 IMPLANT
COVER BACK TABLE 60X90IN (DRAPES) ×2 IMPLANT
COVER SURGICAL LIGHT HANDLE (MISCELLANEOUS) ×2 IMPLANT
CUP SUT UNIV REVERS 36 NEUTRAL (Cup) ×2 IMPLANT
DERMABOND ADVANCED (GAUZE/BANDAGES/DRESSINGS) ×1
DERMABOND ADVANCED .7 DNX12 (GAUZE/BANDAGES/DRESSINGS) ×1 IMPLANT
DRAPE INCISE IOBAN 66X45 STRL (DRAPES) ×2 IMPLANT
DRAPE ORTHO SPLIT 77X108 STRL (DRAPES) ×2
DRAPE SHEET LG 3/4 BI-LAMINATE (DRAPES) ×2 IMPLANT
DRAPE SURG 17X11 SM STRL (DRAPES) ×2 IMPLANT
DRAPE SURG ORHT 6 SPLT 77X108 (DRAPES) ×2 IMPLANT
DRAPE U-SHAPE 47X51 STRL (DRAPES) ×2 IMPLANT
DRSG AQUACEL AG ADV 3.5X 6 (GAUZE/BANDAGES/DRESSINGS) ×2 IMPLANT
DURAPREP 26ML APPLICATOR (WOUND CARE) ×2 IMPLANT
ELECT BLADE TIP CTD 4 INCH (ELECTRODE) ×2 IMPLANT
ELECT REM PT RETURN 15FT ADLT (MISCELLANEOUS) ×2 IMPLANT
FACESHIELD WRAPAROUND (MASK) ×8 IMPLANT
GLENOID UNI REV MOD 24 +2 LAT (Joint) ×2 IMPLANT
GLENOSPHERE 36 +4 LAT/24 (Joint) ×2 IMPLANT
GLOVE SURG ENC MOIS LTX SZ7.5 (GLOVE) ×2 IMPLANT
GLOVE SURG ENC MOIS LTX SZ8 (GLOVE) ×2 IMPLANT
GLOVE SURG MICRO LTX SZ7 (GLOVE) ×2 IMPLANT
GLOVE SURG MICRO LTX SZ7.5 (GLOVE) ×2 IMPLANT
GLOVE SURG SYN 7.0 (GLOVE) IMPLANT
GLOVE SURG SYN 7.5  E (GLOVE)
GLOVE SURG SYN 7.5 E (GLOVE) IMPLANT
GLOVE SURG SYN 8.0 (GLOVE) IMPLANT
GOWN STRL REUS W/TWL LRG LVL3 (GOWN DISPOSABLE) ×4 IMPLANT
KIT BASIN OR (CUSTOM PROCEDURE TRAY) ×2 IMPLANT
KIT TURNOVER KIT A (KITS) ×2 IMPLANT
LINER HUMERAL 36 +3MM SM (Shoulder) ×2 IMPLANT
MANIFOLD NEPTUNE II (INSTRUMENTS) ×2 IMPLANT
NEEDLE TAPERED W/ NITINOL LOOP (MISCELLANEOUS) ×2 IMPLANT
NS IRRIG 1000ML POUR BTL (IV SOLUTION) ×2 IMPLANT
PACK SHOULDER (CUSTOM PROCEDURE TRAY) ×2 IMPLANT
PAD ARMBOARD 7.5X6 YLW CONV (MISCELLANEOUS) ×2 IMPLANT
PAD COLD SHLDR WRAP-ON (PAD) IMPLANT
PIN NITINOL TARGETER 2.8 (PIN) IMPLANT
PIN SET MODULAR GLENOID SYSTEM (PIN) ×2 IMPLANT
RESTRAINT HEAD UNIVERSAL NS (MISCELLANEOUS) ×2 IMPLANT
SCREW CENTRAL MODULAR 25 (Screw) ×2 IMPLANT
SCREW PERI LOCK 5.5X16 (Screw) ×4 IMPLANT
SCREW PERIPHERAL 5.5X20 LOCK (Screw) ×2 IMPLANT
SCREW PERIPHERAL 5.5X28 LOCK (Screw) ×2 IMPLANT
SLING ARM FOAM STRAP LRG (SOFTGOODS) IMPLANT
SLING ARM FOAM STRAP MED (SOFTGOODS) IMPLANT
SLING ARM IMMOBILIZER MED (SOFTGOODS) ×2 IMPLANT
STEM HUMERAL UNIVERS SZ8 (Stem) ×2 IMPLANT
SUCTION FRAZIER HANDLE 12FR (TUBING) ×1
SUCTION TUBE FRAZIER 12FR DISP (TUBING) ×1 IMPLANT
SUT FIBERWIRE #2 38 T-5 BLUE (SUTURE)
SUT MNCRL AB 3-0 PS2 18 (SUTURE) ×2 IMPLANT
SUT MON AB 2-0 CT1 36 (SUTURE) ×2 IMPLANT
SUT VIC AB 1 CT1 36 (SUTURE) ×2 IMPLANT
SUTURE FIBERWR #2 38 T-5 BLUE (SUTURE) IMPLANT
SUTURE TAPE 1.3 40 TPR END (SUTURE) ×2 IMPLANT
SUTURETAPE 1.3 40 TPR END (SUTURE) ×4
TOWEL OR 17X26 10 PK STRL BLUE (TOWEL DISPOSABLE) ×2 IMPLANT
TOWEL OR NON WOVEN STRL DISP B (DISPOSABLE) ×2 IMPLANT
WATER STERILE IRR 1000ML POUR (IV SOLUTION) ×4 IMPLANT

## 2020-06-28 NOTE — H&P (Signed)
Michele Williamson    Chief Complaint: advanced left shoulder osteoarthritis HPI: The patient is a 80 y.o. female with chronic and progressively increasing left shoulder pain related to severe osteoarthritis and associated profound rotator cuff dysfunction.  Due to her increasing functional imitations and failure to respond to prolonged attempts at conservative management she is brought to the operating room at this time for planned left shoulder reverse arthroplasty  Past Medical History:  Diagnosis Date  . Arthritis    shoulder, hands , feet, knees,  . Complication of anesthesia   . Heart murmur   . Hyperlipemia   . Hypertension   . PONV (postoperative nausea and vomiting)     Past Surgical History:  Procedure Laterality Date  . BUNIONECTOMY Right   . CARPAL TUNNEL RELEASE    . CATARACT EXTRACTION    . CESAREAN SECTION    . CHOLECYSTECTOMY  2001  . ROTATOR CUFF REPAIR Right     Family History  Problem Relation Age of Onset  . Hypertension Mother   . Heart disease Mother   . Bone cancer Father   . Hypertension Father   . Hyperlipidemia Father   . Melanoma Brother   . Stomach cancer Maternal Grandmother   . Obesity Son     Social History:  reports that she has never smoked. She has never used smokeless tobacco. She reports that she does not drink alcohol and does not use drugs.   Medications Prior to Admission  Medication Sig Dispense Refill  . alendronate (FOSAMAX) 70 MG tablet Take 1 tablet (70 mg total) by mouth every 7 (seven) days. Take with a full glass of water on an empty stomach. 12 tablet 4  . aspirin EC 81 MG tablet Take 1 tablet (81 mg total) by mouth daily. Swallow whole. 90 tablet 3  . Calcium Carbonate-Vitamin D 600-400 MG-UNIT tablet Take 1 tablet by mouth 2 (two) times daily. (Patient taking differently: Take 1 tablet by mouth daily.)    . furosemide (LASIX) 80 MG tablet Take 1 tablet (80 mg total) by mouth daily. 15 tablet 0  . lisinopril (ZESTRIL) 10 MG  tablet Take 1 tablet (10 mg total) by mouth daily. 15 tablet 0  . loratadine (CLARITIN) 10 MG tablet Take 10 mg by mouth daily. [employer]    . Multiple Vitamins-Minerals (MULTIVITAMIN WITH MINERALS) tablet Take 1 tablet by mouth daily.    Marland Kitchen omeprazole (PRILOSEC) 20 MG capsule Take 1 capsule (20 mg total) by mouth daily. 15 capsule 0  . rosuvastatin (CRESTOR) 20 MG tablet Take 1 tablet (20 mg total) by mouth at bedtime. 15 tablet 0     Physical Exam: Left shoulder demonstrates profoundly restricted and painful motion as noted at recent office visits.  Global weakness to manual muscle testing.  Otherwise neurovascular intact in the left upper extremity.  Plain radiographs confirm severe osteoarthritis with marked joint deformity, subchondral sclerosis, and peripheral osteophyte formation.  Vitals  Temp:  [97.7 F (36.5 C)] 97.7 F (36.5 C) (04/28 0522) Pulse Rate:  [86] 86 (04/28 0522) Resp:  [16] 16 (04/28 0522) BP: (160)/(74) 160/74 (04/28 0522) SpO2:  [95 %] 95 % (04/28 0522) Weight:  [74.8 kg] 74.8 kg (04/28 0515)  Assessment/Plan  Impression: advanced left shoulder osteoarthritis  Plan of Action: Procedure(s): REVERSE SHOULDER ARTHROPLASTY  Mayanna Garlitz M Nyaira Hodgens 06/28/2020, 6:26 AM Contact # [phone]

## 2020-06-28 NOTE — Op Note (Signed)
06/28/2020  9:00 AM  PATIENT:   Michele Williamson  80 y.o. female  PRE-OPERATIVE DIAGNOSIS:  advanced left shoulder osteoarthritis  POST-OPERATIVE DIAGNOSIS: Same  PROCEDURE: Left shoulder reverse arthroplasty utilizing a press-fit size 8 Arthrex stem with a +3 polyethylene insert on a neutral metaphysis, 36/+4 glenosphere on a small/+2 baseplate  SURGEON:  Adrieanna Boteler, Vania Rea M.D.  ASSISTANTS: Ralene Bathe, PA-C  ANESTHESIA:   General endotracheal and interscalene block with Exparel  EBL: 100 cc  SPECIMEN: None  Drains: None   PATIENT DISPOSITION:  PACU - hemodynamically stable.    PLAN OF CARE: Discharge to home after PACU  Brief history:  Patient is a 80 year old female who has had chronic and progressive increasing left shoulder pain with profoundly restricted mobility related to severe osteoarthritis with marked bony deformity and associated rotator cuff dysfunction.  Due to her increasing pain and failure to respond to prolonged attempts at conservative management she is brought to the operating room at this time for planned left shoulder reverse arthroplasty.  Preoperatively, I counseled the patient regarding treatment options and risks versus benefits thereof.  Possible surgical complications were all reviewed including potential for bleeding, infection, neurovascular injury, persistent pain, loss of motion, anesthetic complication, failure of the implant, and possible need for additional surgery. They understand and accept and agrees with our planned procedure.   Procedure in detail:  After undergoing routine preop evaluation the patient received prophylactic antibiotics and interscalene block with Exparel was established in the holding area by the anesthesia department.  Patient was subsequently placed supine on the operating table and underwent the smooth induction of a general endotracheal anesthesia.  Subsequently placed into the beachchair position and appropriately  padded and protected.  Left shoulder girdle region was sterilely prepped and draped in standard fashion.  Timeout was called.  A deltopectoral approach left shoulder is made through an approximate centimeter incision.  Skin flaps elevated dissection carried deeply and the deltopectoral interval was developed from proximal to distal with the vein taken laterally.  Upper centimeter half the pectoralis major tendon was divided for exposure the conjoined tendon was mobilized and retracted medially.  Adhesions divided beneath the deltoid.  The long head biceps tendon was tenodesed at the upper border the pectoralis major tendon the proximal segment was elevated and excised.  The subscapularis was then elevated from the lesser tuberosity using a peel technique with rotator cuff split along the rotator interval to the base of the coracoid and the free margin the subscap was then tagged with a pair of suture tape sutures.  Capsular attachments were then divided on the anterior inferior margins of the humeral neck and there was a very large osteophyte inferiorly which was carefully dissected free ultimately allowing delivery of the humeral head through the wound.  It was markedly deformed.  An extra medullary guide was then used to outline a proposed humeral head resection which was then performed with an oscillating saw at approximate 20 degrees retroversion.  The peripheral osteophytes were then removed with a rondure and a metal cap was then placed over the cut proximal humeral surface.  The glenoid was then exposed with appropriate retractors and a circumferential labral resection was then performed.  A guidepin directed into the center of the humeral head measured at a 25 mm lag screw.  The glenoid was then prepared with the central followed by the peripheral reamer and all debris was removed.  Preparation completed with the central drill and tap.  The baseplate  was assembled and with vancomycin powder applied to the  threads the lag screw was inserted with the baseplate achieving excellent fit and fixation.  A 36/+4 glenosphere was then impacted on the baseplate and the central locking screw was placed.  Attention then returned to the proximal humerus where the canal was opened hand reaming to size 7 and ultimately broaching to a size 8 at approximately 20 degrees retroversion.  A central reaming guide was then utilized to prepare the metaphysis.  Trial reduction showed excellent fit and fixation with good soft tissue balance motion and stability.  The trial was then removed and the final implant was assembled.  Vancomycin powder spread liberally into the humeral canal.  The final implant was then impacted with excellent fit and fixation.  Trial reductions ultimately showed that the +3 probably give [place] the best soft tissue balance with good motion and good stability.  The trial was removed the final polyp was impacted after the implant was cleaned and dried.  Final reduction showed excellent motion good stability good soft tissue balance.  This time we mobilized the subscapularis and it did show contractility and so the subscap was repaired to the eyelets on the collar the implant using the previously placed suture tape sutures.  Final irrigation was completed.  The balance of the vancomycin powder was then spread liberally through the soft tissue planes.  The deltopectoral interval was reapproximated with a series of figure-of-eight number Vicryl sutures.  2-0 Monocryl used to the subcu layer and intracuticular 3-0 Monocryl for the skin followed by Dermabond and Aquacel dressing.  Left arm was placed into a sling.  The patient was awakened, extubated, and taken to the recovery room in stable condition.  Ralene Bathe, PA-C was utilized as an Geophysicist/field seismologist throughout this case, essential for help with positioning the patient, positioning extremity, tissue manipulation, implantation of the prosthesis, suture management, wound  closure, and intraoperative decision-making.  Senaida Lange MD   Contact # [phone]

## 2020-06-28 NOTE — Discharge Instructions (Signed)
 Kevin M. Supple, M.D., F.A.A.O.S. Orthopaedic Surgery Specializing in Arthroscopic and Reconstructive Surgery of the Shoulder [phone] [address] - [place], [place] [address] - Fax [phone]   POST-OP TOTAL SHOULDER REPLACEMENT INSTRUCTIONS  1. Follow up in the office for your first post-op appointment 10-14 days from the date of your surgery. If you do not already have a scheduled appointment, our office will contact you to schedule.  2. The bandage over your incision is waterproof. You may begin showering with this dressing on. You may leave this dressing on until first follow up appointment within 2 weeks. We prefer you leave this dressing in place until follow up however after 5-7 days if you are having itching or skin irritation and would like to remove it you may do so. Go slow and tug at the borders gently to break the bond the dressing has with the skin. At this point if there is no drainage it is okay to go without a bandage or you may cover it with a light guaze and tape. You can also expect significant bruising around your shoulder that will drift down your arm and into your chest wall. This is very normal and should resolve over several days.   3. Wear your sling/immobilizer at all times except to perform the exercises below or to occasionally let your arm dangle by your side to stretch your elbow. You also need to sleep in your sling immobilizer until instructed otherwise. It is ok to remove your sling if you are sitting in a controlled environment and allow your arm to rest in a position of comfort by your side or on your lap with pillows to give your neck and skin a break from the sling. You may remove it to allow arm to dangle by side to shower. If you are up walking around and when you go to sleep at night you need to wear it.  4. Range of motion to your elbow, wrist, and hand are encouraged 3-5 times daily. Exercise to your hand and fingers helps to reduce  swelling you may experience.   5. Prescriptions for a pain medication and a muscle relaxant are provided for you. It is recommended that if you are experiencing pain that you pain medication alone is not controlling, add the muscle relaxant along with the pain medication which can give additional pain relief. The first 1-2 days is generally the most severe of your pain and then should gradually decrease. As your pain lessens it is recommended that you decrease your use of the pain medications to an "as needed basis'" only and to always comply with the recommended dosages of the pain medications.  6. Pain medications can produce constipation along with their use. If you experience this, the use of an over the counter stool softener or laxative daily is recommended.   7. For additional questions or concerns, please do not hesitate to call the office. If after hours there is an answering service to forward your concerns to the physician on call.  8.Pain control following an exparel block  To help control your post-operative pain you received a nerve block  performed with Exparel which is a long acting anesthetic (numbing agent) which can provide pain relief and sensations of numbness (and relief of pain) in the operative shoulder and arm for up to 3 days. Sometimes it provides mixed relief, meaning you may still have numbness in certain areas of the arm but can still be able to   move  parts of that arm, hand, and fingers. We recommend that your prescribed pain medications  be used as needed. We do not feel it is necessary to "pre medicate" and "stay ahead" of pain.  Taking narcotic pain medications when you are not having any pain can lead to unnecessary and potentially dangerous side effects.    9. Use the ice machine as much as possible in the first 5-7 days from surgery, then you can wean its use to as needed. The ice typically needs to be replaced every 6 hours, instead of ice you can actually freeze  water bottles to put in the cooler and then fill water around them to avoid having to purchase ice. You can have spare water bottles freezing to allow you to rotate them once they have melted. Try to have a thin shirt or light cloth or towel under the ice wrap to protect your skin.   FOR ADDITIONAL INFO ON ICE MACHINE AND INSTRUCTIONS GO TO THE WEBSITE AT  https://[email or web address].com/products/donjoy/[email or web address]-[email or web address]  10.  We recommend that you avoid any dental work or cleaning in the first 3 months following your joint replacement. This is to help minimize the possibility of infection from the bacteria in your mouth that enters your bloodstream during dental work. We also recommend that you take an antibiotic prior to your dental work for the first year after your shoulder replacement to further help reduce that risk. Please simply contact our office for antibiotics to be sent to your pharmacy prior to dental work.  11. Dental Antibiotics:  In most cases prophylactic antibiotics for Dental procdeures after total joint surgery are not necessary.  Exceptions are as follows:  1. History of prior total joint infection  2. Severely immunocompromised (Organ Transplant, cancer chemotherapy, Rheumatoid biologic meds such as Humera)  3. Poorly controlled diabetes (A1C &gt; 8.0, blood glucose over 200)  If you have one of these conditions, contact your surgeon for an antibiotic prescription, prior to your dental procedure.   POST-OP EXERCISES  Pendulum Exercises  Perform pendulum exercises while standing and bending at the waist. Support your uninvolved arm on a table or chair and allow your operated arm to hang freely. Make sure to do these exercises passively - not using you shoulder muscles. These exercises can be performed once your nerve block effects have worn off.  Repeat 20 times. Do 3 sessions per day.     

## 2020-06-28 NOTE — Anesthesia Procedure Notes (Signed)
Procedure Name: Intubation Date/Time: 06/28/2020 7:47 AM Performed by: Gwyndolyn Saxon, CRNA Pre-anesthesia Checklist: Patient identified, Emergency Drugs available, Suction available and Patient being monitored Patient Re-evaluated:Patient Re-evaluated prior to induction Oxygen Delivery Method: Circle system utilized Preoxygenation: Pre-oxygenation with 100% oxygen Induction Type: IV induction Ventilation: Mask ventilation without difficulty Laryngoscope Size: Mac and 3 Grade View: Grade II Tube type: Oral Tube size: 6.5 mm Number of attempts: 2 Airway Equipment and Method: Patient positioned with wedge pillow and Stylet Placement Confirmation: ETT inserted through vocal cords under direct vision,  positive ETCO2 and breath sounds checked- equal and bilateral Secured at: 22 cm Tube secured with: Tape Dental Injury: Teeth and Oropharynx as per pre-operative assessment  Comments: DL #1 with miller 2; grade 3 view. Unable to pass ETT. DL #2 with mac 3. Grade 2/3 view. No change in dentition, lips, or gums

## 2020-06-28 NOTE — Anesthesia Procedure Notes (Signed)
Anesthesia Regional Block: Interscalene brachial plexus block   Pre-Anesthetic Checklist: ,, timeout performed, Correct Patient, Correct Site, Correct Laterality, Correct Procedure, Correct Position, site marked, Risks and benefits discussed,  Surgical consent,  Pre-op evaluation,  At surgeon's request and post-op pain management  Laterality: Left  Prep: Dura Prep       Needles:  Injection technique: Single-shot  Needle Type: Echogenic Stimulator Needle     Needle Length: 5cm  Needle Gauge: 20     Additional Needles:   Procedures:,,,, ultrasound used (permanent image in chart),,,,  Narrative:  Start time: 06/28/2020 7:03 AM End time: 06/28/2020 7:07 AM Injection made incrementally with aspirations every 5 mL.  Performed by: Personally  Anesthesiologist: Atilano Median, DO  Additional Notes: Patient identified. Risks/Benefits/Options discussed with patient including but not limited to bleeding, infection, nerve damage, failed block, incomplete pain control. Patient expressed understanding and wished to proceed. All questions were answered. Sterile technique was used throughout the entire procedure. Please see nursing notes for vital signs. Aspirated in 5cc intervals with injection for negative confirmation. Patient was given instructions on fall risk and not to get out of bed. All questions and concerns addressed with instructions to call with any issues or inadequate analgesia.

## 2020-06-28 NOTE — Anesthesia Postprocedure Evaluation (Signed)
Anesthesia Post Note  Patient: Michele Williamson  Procedure(s) Performed: REVERSE SHOULDER ARTHROPLASTY (Left Shoulder)     Patient location during evaluation: PACU Anesthesia Type: Regional and General Level of consciousness: awake and alert Pain management: pain level controlled Vital Signs Assessment: post-procedure vital signs reviewed and stable Respiratory status: spontaneous breathing, nonlabored ventilation, respiratory function stable and patient connected to nasal cannula oxygen Cardiovascular status: blood pressure returned to baseline and stable Postop Assessment: no apparent nausea or vomiting Anesthetic complications: no   No complications documented.  Last Vitals:  Vitals:   06/28/20 1115 06/28/20 1228  BP:  (!) 169/84  Pulse: 93 91  Resp: 16 16  Temp:  36.6 C  SpO2: 94% 95%    Last Pain:  Vitals:   06/28/20 1228  TempSrc:   PainSc: 0-No pain                 Earl Lites P Mally Gavina

## 2020-06-28 NOTE — Evaluation (Addendum)
Occupational Therapy Evaluation Patient Details Name: Michele Williamson MRN: [id number] DOB: 24-Mar-1940 Today's Date: 06/28/2020    History of Present Illness Patient is a 80 year old female s/p L reverse total shoulder arthroplasty. PMH includes heart murur, carpel tunnel release, HTN   Clinical Impression   Patient is a 80 year old female s/p shoulder replacement without functional use of non dominant left upper extremity secondary to effects of surgery and interscalene block and shoulder precautions. Therapist provided education and instruction to patient and daughter in regards to exercises, precautions, positioning, donning upper extremity clothing and bathing while maintaining shoulder precautions, ice and edema management and donning/doffing sling. Patient and daughter verbalized understanding and demonstrated as needed. Patient needed assistance to donn shirt, underwear, pants, socks and shoes and provided with instruction on compensatory strategies to perform ADLs. Patient to follow up with MD for further therapy needs.      Follow Up Recommendations  Follow surgeon's recommendation for DC plan and follow-up therapies    Equipment Recommendations  None recommended by OT       Precautions / Restrictions Precautions Precautions: Shoulder Type of Shoulder Precautions: If sitting in controlled environment, ok to come out of sling to give neck a break. Please sleep in it to protect until follow up in office.     OK to use operative arm for feeding, hygiene and ADLs. Ok to instruct Pendulums and lap slides as exercises. Ok to use operative arm within the following parameters for ADL purposes New ROM (8/18)   Ok for PROM, AAROM, AROM within pain tolerance and within the following ROM   ER 20   ABD 45   FE 60. AROM elbow, wrist, hand ok Shoulder Interventions: Shoulder sling/immobilizer;Off for dressing/bathing/exercises Precaution Booklet Issued: Yes (comment) Required Braces or Orthoses:  Sling Restrictions Weight Bearing Restrictions: Yes LUE Weight Bearing: Non weight bearing      Mobility Bed Mobility               General bed mobility comments: in chair    Transfers Overall transfer level: Needs assistance Equipment used: None Transfers: Sit to/from Stand Sit to Stand: Min guard         General transfer comment: mildly unsteady    Balance Overall balance assessment: Mild deficits observed, not formally tested                                         ADL either performed or assessed with clinical judgement   ADL Overall ADL's : Needs assistance/impaired     Grooming: Independent;Sitting   Upper Body Bathing: Minimal assistance;Sitting   Lower Body Bathing: Moderate assistance;Sitting/lateral leans;Sit to/from stand   Upper Body Dressing : Moderate assistance;Standing;Cueing for UE precautions;Cueing for compensatory techniques Upper Body Dressing Details (indicate cue type and reason): assist to thread L UE and button shirt Lower Body Dressing: Moderate assistance;Sitting/lateral leans;Sit to/from stand Lower Body Dressing Details (indicate cue type and reason): needs assist with pull up pants especially L side, assist with shoes Toilet Transfer: Min guard Toilet Transfer Details (indicate cue type and reason): patient mildly unsteady Toileting- Clothing Manipulation and Hygiene: Moderate assistance;Sit to/from stand;Sitting/lateral lean       Functional mobility during ADLs: Min guard General ADL Comments: patient and DTR educated in compensatory strategies in order to manage ADLs safely in order to keep shoulder precautions  Pertinent Vitals/Pain Pain Assessment: Faces Faces Pain Scale: Hurts a little bit Pain Location: L shoulder Pain Descriptors / Indicators: Numbness;Heaviness Pain Intervention(s): Monitored during session     Hand Dominance Right   Extremity/Trunk Assessment Upper  Extremity Assessment Upper Extremity Assessment: LUE deficits/detail LUE Deficits / Details: + nerve block   Lower Extremity Assessment Lower Extremity Assessment: Overall WFL for tasks assessed   Cervical / Trunk Assessment Cervical / Trunk Assessment: Normal   Communication Communication Communication: No difficulties   Cognition Arousal/Alertness: Awake/alert Behavior During Therapy: WFL for tasks assessed/performed Overall Cognitive Status: Within Functional Limits for tasks assessed                                        Exercises Exercises: Shoulder   Shoulder Instructions Shoulder Instructions Donning/doffing shirt without moving shoulder: Moderate assistance;Caregiver independent with task Method for sponge bathing under operated UE: Minimal assistance;Caregiver independent with task Donning/doffing sling/immobilizer: Maximal assistance;Caregiver independent with task Correct positioning of sling/immobilizer: Caregiver independent with task Pendulum exercises (written home exercise program): Caregiver independent with task;Patient able to independently direct caregiver ROM for elbow, wrist and digits of operated UE: Caregiver independent with task;Patient able to independently direct caregiver Sling wearing schedule (on at all times/off for ADL's): Patient able to independently direct caregiver;Caregiver independent with task Proper positioning of operated UE when showering: Caregiver independent with task;Patient able to independently direct caregiver Dressing change:  (N/A) Positioning of UE while sleeping: Patient able to independently direct caregiver;Caregiver independent with task    Home Living Family/patient expects to be discharged to:: Private residence Living Arrangements: Children;Other (Comment) (daughter) Available Help at Discharge: Family;Available 24 hours/day Type of Home: House Home Access: Stairs to enter [employer] of  Steps: 1   Home Layout: One level     Bathroom Shower/Tub: Producer, television/film/video: Handicapped height     Home Equipment: Cane - single point;Shower seat;Grab bars - tub/shower          Prior Functioning/Environment Level of Independence: Independent with assistive device(s)        Comments: ocassional use of cane to get out of bed with        OT Problem List: Pain;Impaired UE functional use         OT Goals(Current goals can be found in the care plan section) Acute Rehab OT Goals Patient Stated Goal: home OT Goal Formulation: All assessment and education complete, DC therapy   AM-PAC OT "6 Clicks" Daily Activity     Outcome Measure Help from another person eating meals?: None Help from another person taking care of personal grooming?: None Help from another person toileting, which includes using toliet, bedpan, or urinal?: A Lot Help from another person bathing (including washing, rinsing, drying)?: A Lot Help from another person to put on and taking off regular upper body clothing?: A Lot Help from another person to put on and taking off regular lower body clothing?: A Lot 6 Click Score: 16   End of Session Equipment Utilized During Treatment: Other (comment) (sling) Nurse Communication: Other (comment) (completed OT)  Activity Tolerance: Patient tolerated treatment well Patient left: in chair;with family/visitor present  OT Visit Diagnosis: Pain Pain - Right/Left: Left Pain - part of body: Shoulder                Time: 8841-6606 OT Time Calculation (min): 27 min Charges:  OT  General Charges $OT Visit: 1 Visit OT Evaluation $OT Eval Low Complexity: 1 Low OT Treatments $Self Care/Home Management : 8-22 mins  Marlyce Huge OT OT pager: [phone]  Carmelia Roller 06/28/2020, 12:40 PM

## 2020-06-28 NOTE — Transfer of Care (Signed)
Immediate Anesthesia Transfer of Care Note  Patient: Michele Williamson  Procedure(s) Performed: REVERSE SHOULDER ARTHROPLASTY (Left Shoulder)  Patient Location: PACU  Anesthesia Type:General and Regional  Level of Consciousness: drowsy and patient cooperative  Airway & Oxygen Therapy: Patient Spontanous Breathing and Patient connected to face mask oxygen  Post-op Assessment: Report given to RN and Post -op Vital signs reviewed and stable  Post vital signs: Reviewed and stable  Last Vitals:  Vitals Value Taken Time  BP 167/90 06/28/20 0918  Temp    Pulse 96 06/28/20 0923  Resp 10 06/28/20 0923  SpO2 98 % 06/28/20 0923  Vitals shown include unvalidated device data.  Last Pain:  Vitals:   06/28/20 0528  TempSrc:   PainSc: 0-No pain         Complications: No complications documented.

## 2020-06-29 ENCOUNTER — Encounter (HOSPITAL_COMMUNITY): Payer: Self-pay | Admitting: Orthopedic Surgery

## 2020-07-09 ENCOUNTER — Other Ambulatory Visit: Payer: Self-pay

## 2020-07-09 ENCOUNTER — Encounter: Payer: Self-pay | Admitting: Physical Therapy

## 2020-07-09 ENCOUNTER — Ambulatory Visit: Payer: Medicare Other | Attending: Orthopedic Surgery | Admitting: Physical Therapy

## 2020-07-09 DIAGNOSIS — Z96612 Presence of left artificial shoulder joint: Secondary | ICD-10-CM | POA: Diagnosis not present

## 2020-07-09 DIAGNOSIS — M25512 Pain in left shoulder: Secondary | ICD-10-CM | POA: Diagnosis not present

## 2020-07-09 DIAGNOSIS — M6281 Muscle weakness (generalized): Secondary | ICD-10-CM | POA: Diagnosis not present

## 2020-07-09 DIAGNOSIS — M25612 Stiffness of left shoulder, not elsewhere classified: Secondary | ICD-10-CM | POA: Insufficient documentation

## 2020-07-09 DIAGNOSIS — R600 Localized edema: Secondary | ICD-10-CM | POA: Insufficient documentation

## 2020-07-09 NOTE — Therapy (Signed)
[facility] [address] [place], [place], [address] Phone: [phone]   Fax:  [phone]  Physical Therapy Evaluation  Patient Details  Name: Michele Williamson MRN: [id number] Date of Birth: September 10, 1940 Referring Provider (PT): Supple   Encounter Date: 07/09/2020   PT End of Session - 07/09/20 1628    Visit Number 1    Date for PT Re-Evaluation 10/01/20    PT Start Time 1400    PT Stop Time 1445    PT Time Calculation (min) 45 min    Activity Tolerance Patient tolerated treatment well    Behavior During Therapy [facility] for tasks assessed/performed           Past Medical History:  Diagnosis Date  . Arthritis    shoulder, hands , feet, knees,  . Complication of anesthesia   . Heart murmur   . Hyperlipemia   . Hypertension   . PONV (postoperative nausea and vomiting)     Past Surgical History:  Procedure Laterality Date  . BUNIONECTOMY Right   . CARPAL TUNNEL RELEASE    . CATARACT EXTRACTION    . CESAREAN SECTION    . CHOLECYSTECTOMY  2001  . 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 ROTATOR CUFF REPAIR Right     There were no vitals filed for this visit.    Subjective Assessment - 07/09/20 1359    Subjective Pt s/p L reverse TSA 06/28/20. Pt saw MD this morning for f/u; states that she was released from the sling this morning; may wear if she needs to for comfort. Pt states per MD shoulder flexion/abduction/ER to tolerance but no lifting "heavy weights." Pt provided protocol from surgeon which was scanned into her chart. States icing several times a day. Would like to get back to playing with grandchildren, housework, and light gardening.    Pertinent History HTN    Limitations Lifting;House hold activities    Patient Stated Goals play with grandkids, return to PLOF     Currently in Pain? Yes    Pain Score 3     Pain Location Shoulder    Pain Orientation Left    Pain Descriptors / Indicators Discomfort    Pain Type Surgical pain    Pain Radiating Towards none; states just numbness in the thumb    Pain Onset 1 to 4 weeks ago    Aggravating Factors  lifting arm, too much movement    Pain Relieving Factors tylenol, naproxen, ice              [facility] PT Assessment - 07/09/20 0001      Assessment   Medical Diagnosis L reverse TSA    Referring Provider (PT) Supple    Onset Date/Surgical Date 06/28/20    Hand Dominance Right    Next MD Visit 08/06/2020    Prior Therapy none      Precautions   Precautions Shoulder    Type of Shoulder Precautions no extension for 6 weeks post surgery, per MD flexion/abduction/ER to tolerance      Balance Screen   Has the patient fallen in the past 6 months No    Has the patient had a decrease in activity level because of a fear of falling?  No    Is the patient reluctant to leave their home because of a fear of  falling?  No      Home Environment   Additional Comments some housework      Prior Function   Level of Independence Independent    Vocation Retired    Leisure playing with grandkids, swimming, "light" gardening      Sensation   Light Touch Impaired by gross assessment    Additional Comments intact BUE except numbess in L 1st digit      Posture/Postural Control   Posture/Postural Control Postural limitations    Postural Limitations Rounded Shoulders;Forward head      ROM / Strength   AROM / PROM / Strength PROM;AROM      AROM   AROM Assessment Site Shoulder    Right/Left Shoulder Left    Left Shoulder Flexion 70 Degrees      PROM   PROM Assessment Site Shoulder    Right/Left Shoulder Left    Left Shoulder Flexion 90 Degrees    Left Shoulder ABduction 65 Degrees    Left Shoulder External Rotation 25 Degrees                      Objective measurements completed on examination:  See above findings.       [facility] Adult PT Treatment/Exercise - 07/09/20 0001      Exercises   Exercises Shoulder      Shoulder Exercises: Standing   Other Standing Exercises shoulder ER/abduction isometrics x10      Shoulder Exercises: Stretch   Table Stretch - Flexion 5 reps;10 seconds    Table Stretch -Flexion Limitations painfree ROM    Table Stretch - Abduction 5 reps;10 seconds    Table Stretch - ABduction Limitations painfree ROM      Modalities   Modalities Vasopneumatic      Vasopneumatic   Number Minutes Vasopneumatic  10 minutes    Vasopnuematic Location  Shoulder    Vasopneumatic Pressure Low    Vasopneumatic Temperature  34                  PT Education - 07/09/20 1437    Education Details Pt educated on POC and HEP    Person(s) Educated Patient    Methods Explanation;Demonstration;Handout    Comprehension Verbalized understanding;Returned demonstration            PT Short Term Goals - 07/09/20 1629      PT SHORT TERM GOAL #1   Title Pt will be I with initial HEP    Time 2    Period Weeks    Status New    Target Date 07/23/20             PT Long Term Goals - 07/09/20 1629      PT LONG TERM GOAL #1   Title Pt will be I with advanced HEP    Time 8    Period Weeks    Status New    Target Date 09/03/20      PT LONG TERM GOAL #2   Title Pt will demo L shoulder flexion/abduction ROM equivalent to R shoulder    Time 8    Period Weeks    Status New    Target Date 09/03/20      PT LONG TERM GOAL #3   Title Pt will demo L shoulder flexion/abduction MMT 5/5    Time 8    Period Weeks    Status New    Target Date 09/04/20      PT  LONG TERM GOAL #4   Title Pt will demo able to perform all household ADLs with no increase in L shoulder pain    Time 8    Period Weeks    Status New    Target Date 09/04/20                  Plan - 07/09/20 1629    Clinical Impression Statement Pt presents to clinic s/p L reverse TSA 06/28/20.  Pt out of sling; states that she had MD appt this morning 5/9 and was released from sling along with given instructions for painfree ROM progression in flexion/abduction/ER. Pt does have protocol from MD scanned into chart. See objective for baseline ROM. Pt has been completing elbow/wrist ROM along with shoulder pendulums. Added table flexion/abduction slides in painfree range and shoulder abduction/ER isometrics with pt demo understanding. Progress per protocol.    Examination-Activity Limitations Lift;Carry;Reach Overhead    Examination-Participation Restrictions Community Activity;Interpersonal Relationship;Laundry;Cleaning;Meal Prep;Yard Work    Conservation officer, historic buildings Evolving/Moderate complexity    Clinical Decision Making Moderate    Rehab Potential Good    PT Frequency 2x / week    PT Duration 8 weeks    PT Treatment/Interventions ADLs/Self Care Home Management;Electrical Stimulation;Iontophoresis 4mg /ml Dexamethasone;Neuromuscular re-education;Therapeutic exercise;Therapeutic activities;Patient/family education;Manual techniques;Passive range of motion;Vasopneumatic Device    PT Next Visit Plan review/progress HEP, initiate TE and progress per protocol, manual/modalities as indicated    PT Home Exercise Plan see pt instructions    Consulted and Agree with Plan of Care Patient           Patient will benefit from skilled therapeutic intervention in order to improve the following deficits and impairments:     Visit Diagnosis: Acute pain of left shoulder  Stiffness of left shoulder, not elsewhere classified  Muscle weakness (generalized)  Localized edema     Problem List Patient Active Problem List   Diagnosis Date Noted  . Osteoarthritis 03/08/2020  . Hypertension   . Hyperlipemia   . Depression 06/10/2017  . Allergic rhinitis 08/21/2013  . Arthralgia of temporomandibular joint 08/21/2013  . Ataxia 08/21/2013  . Constipation 08/21/2013  . Dizziness  08/21/2013  . Edema 08/21/2013  . Esophageal reflux 08/21/2013  . Hypercalcemia 08/21/2013  . 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 Osteopenia 08/21/2013  . 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 Symptomatic menopausal or female climacteric states 08/21/2013  . Abdominal pain 04/04/2009  . Abnormal weight loss 04/04/2009  . Benign essential hypertension 04/04/2009  . Mixed hyperlipidemia 04/04/2009  . Nausea and vomiting 04/04/2009   06/02/2009, PT, DPT Lysle Rubens Tamon Parkerson 07/09/2020, 4:30 PM  [facility] [address] [place], [place], [place] Phone: [phone]   Fax:  [phone]  Name: [phone] Michele Williamson MRN: Michele Date of Birth: 11/05/1940

## 2020-07-09 NOTE — Patient Instructions (Signed)
Access Code: [email or web address] URL: [email or web address] Date: 07/09/2020 Prepared by: Lysle Rubens  Exercises Seated Shoulder Flexion Towel Slide at Table Top - 1 x daily - 7 x weekly - 2 sets - 10 reps - 3-5 sec hold Seated Shoulder Abduction Towel Slide at Table Top - 1 x daily - 7 x weekly - 2 sets - 10 reps - 3-5 sec hold Isometric Shoulder Abduction at Wall - 1 x daily - 7 x weekly - 2 sets - 10 reps - 3 sec hold Isometric Shoulder External Rotation at Wall - 1 x daily - 7 x weekly - 2 sets - 10 reps - 3 sec hold

## 2020-07-17 ENCOUNTER — Encounter: Payer: Self-pay | Admitting: Physical Therapy

## 2020-07-17 ENCOUNTER — Other Ambulatory Visit: Payer: Self-pay

## 2020-07-17 ENCOUNTER — Ambulatory Visit: Payer: Medicare Other | Admitting: Physical Therapy

## 2020-07-17 DIAGNOSIS — R600 Localized edema: Secondary | ICD-10-CM

## 2020-07-17 DIAGNOSIS — M25612 Stiffness of left shoulder, not elsewhere classified: Secondary | ICD-10-CM | POA: Diagnosis not present

## 2020-07-17 DIAGNOSIS — M6281 Muscle weakness (generalized): Secondary | ICD-10-CM

## 2020-07-17 DIAGNOSIS — M25512 Pain in left shoulder: Secondary | ICD-10-CM

## 2020-07-17 NOTE — Therapy (Signed)
[facility] [address] [place], [place], [address] Phone: [phone]   Fax:  [phone]  Physical Therapy Treatment  Patient Details  Name: Michele Williamson MRN: [id number] Date of Birth: [birth year] Referring Provider (PT): Supple   Encounter Date: 07/17/2020   PT End of Session - 07/17/20 1417    Visit Number 2    Date for PT Re-Evaluation 10/01/20    PT Start Time 1345    PT Stop Time 1415    PT Time Calculation (min) 30 min    Activity Tolerance Patient tolerated treatment well    Behavior During Therapy [facility] for tasks assessed/performed           Past Medical History:  Diagnosis Date  . Arthritis    shoulder, hands , feet, knees,  . Complication of anesthesia   . Heart murmur   . Hyperlipemia   . Hypertension   . PONV (postoperative nausea and vomiting)     Past Surgical History:  Procedure Laterality Date  . BUNIONECTOMY Right   . CARPAL TUNNEL RELEASE    . CATARACT EXTRACTION    . CESAREAN SECTION    . CHOLECYSTECTOMY  2001  . 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 ROTATOR CUFF REPAIR Right     There were no vitals filed for this visit.   Subjective Assessment - 07/17/20 1347    Subjective Its going, getting better everyday, no pain only a numb thumb    Currently in Pain? No/denies                             [facility] Adult PT Treatment/Exercise - 07/17/20 0001      Shoulder Exercises: Seated   Other Seated Exercises Ball squeexes LUE 2x15      Shoulder Exercises: Standing   Other Standing Exercises AAROM flex, Ext, IR up back x10; ER & abduction isometrics 5'' x5    Other Standing Exercises Ball on table  flex,CW/CCW x15; Tricep Ext 10lb 2x10      Manual Therapy   Manual Therapy Passive ROM    Passive ROM L shoulder all  directions,                    PT Short Term Goals - 07/09/20 1629      PT SHORT TERM GOAL #1   Title Pt will be I with initial HEP    Time 2    Period Weeks    Status New    Target Date 07/23/20             PT Long Term Goals - 07/09/20 1629      PT LONG TERM GOAL #1   Title Pt will be I with advanced HEP    Time 8    Period Weeks    Status New    Target Date 09/03/20      PT LONG TERM GOAL #2   Title Pt will demo L shoulder flexion/abduction ROM equivalent to R shoulder    Time 8    Period Weeks    Status New    Target Date 09/03/20      PT LONG TERM GOAL #3   Title Pt will demo L shoulder flexion/abduction MMT 5/5    Time 8  Period Weeks    Status New    Target Date 09/04/20      PT LONG TERM GOAL #4   Title Pt will demo able to perform all household ADLs with no increase in L shoulder pain    Time 8    Period Weeks    Status New    Target Date 09/04/20                 Plan - 07/17/20 1417    Clinical Impression Statement Pt tolerated an initial progression to TE well. She had little pain with AAROM intervention. Reports that she could feel a catch in her L shoulder with external rotation and abduction isometrics. She did well with MT overall, EX rotation and abduction being the most limited and painful.    Examination-Activity Limitations Lift;Carry;Reach Overhead    Examination-Participation Restrictions Community Activity;Interpersonal Relationship;Laundry;Cleaning;Meal Prep;Yard Work    Rehab Potential Good    PT Frequency 2x / week    PT Duration 8 weeks    PT Treatment/Interventions ADLs/Self Care Home Management;Electrical Stimulation;Iontophoresis 4mg /ml Dexamethasone;Neuromuscular re-education;Therapeutic exercise;Therapeutic activities;Patient/family education;Manual techniques;Passive range of motion;Vasopneumatic Device    PT Next Visit Plan review/progress HEP, initiate TE and progress per protocol, manual/modalities as  indicated           Patient will benefit from skilled therapeutic intervention in order to improve the following deficits and impairments:  Decreased range of motion,Increased muscle spasms,Impaired UE functional use,Pain,Hypomobility,Decreased strength,Increased edema,Impaired sensation  Visit Diagnosis: Muscle weakness (generalized)  Localized edema  Stiffness of left shoulder, not elsewhere classified  Acute pain of left shoulder     Problem List Patient Active Problem List   Diagnosis Date Noted  . Osteoarthritis 03/08/2020  . Hypertension   . Hyperlipemia   . Depression 06/10/2017  . Allergic rhinitis 08/21/2013  . Arthralgia of temporomandibular joint 08/21/2013  . Ataxia 08/21/2013  . Constipation 08/21/2013  . Dizziness 08/21/2013  . Edema 08/21/2013  . Esophageal reflux 08/21/2013  . Hypercalcemia 08/21/2013  . 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 Osteopenia 08/21/2013  . 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 Symptomatic menopausal or female climacteric states 08/21/2013  . Abdominal pain 04/04/2009  . Abnormal weight loss 04/04/2009  . Benign essential hypertension 04/04/2009  . Mixed hyperlipidemia 04/04/2009  . Nausea and vomiting 04/04/2009    06/02/2009, PTA 07/17/2020, 2:23 PM  [facility] [address] [place], [place], [place] Phone: [phone]   Fax:  [phone]  Name: [phone] M Menning MRN: Michele Date of Birth: 02/09/1941

## 2020-07-19 ENCOUNTER — Encounter: Payer: Self-pay | Admitting: Physical Therapy

## 2020-07-19 ENCOUNTER — Other Ambulatory Visit: Payer: Self-pay

## 2020-07-19 ENCOUNTER — Ambulatory Visit: Payer: Medicare Other | Admitting: Physical Therapy

## 2020-07-19 DIAGNOSIS — M25512 Pain in left shoulder: Secondary | ICD-10-CM | POA: Diagnosis not present

## 2020-07-19 DIAGNOSIS — M25612 Stiffness of left shoulder, not elsewhere classified: Secondary | ICD-10-CM

## 2020-07-19 DIAGNOSIS — M6281 Muscle weakness (generalized): Secondary | ICD-10-CM

## 2020-07-19 DIAGNOSIS — R600 Localized edema: Secondary | ICD-10-CM

## 2020-07-19 NOTE — Therapy (Signed)
[facility] [place] [facility] [address] [place], [place], [address] Phone: [phone]   Fax:  [phone]  Physical Therapy Treatment  Patient Details  Name: Michele Williamson MRN: [id number] Date of Birth: [birth year] Referring Provider (PT): Supple   Encounter Date: 07/19/2020   PT End of Session - 07/19/20 0926    Visit Number 3    Date for PT Re-Evaluation 10/01/20    PT Start Time 0845    PT Stop Time 0934    PT Time Calculation (min) 49 min    Activity Tolerance Patient tolerated treatment well    Behavior During Therapy [facility] for tasks assessed/performed           Past Medical History:  Diagnosis Date  . Arthritis    shoulder, hands , feet, knees,  . Complication of anesthesia   . Heart murmur   . Hyperlipemia   . Hypertension   . PONV (postoperative nausea and vomiting)     Past Surgical History:  Procedure Laterality Date  . BUNIONECTOMY Right   . CARPAL TUNNEL RELEASE    . CATARACT EXTRACTION    . CESAREAN SECTION    . CHOLECYSTECTOMY  2001  . 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 ROTATOR CUFF REPAIR Right     There were no vitals filed for this visit.   Subjective Assessment - 07/19/20 0852    Subjective Pt reports no pain today, states thumb is still numb. Reports she had "excruciating" pain for ~5 hours after last tx; also states that has resolved today.    Currently in Pain? No/denies                             [facility] Adult PT Treatment/Exercise - 07/19/20 0001      Shoulder Exercises: Supine   Other Supine Exercises supine flexion/abd/ER 2x10 with 1# weight bar   flexion/abd to 90 AAROM, ER to 30     Shoulder Exercises: Seated   Other Seated Exercises Ball squeezes LUE 2x15    Other Seated Exercises elbow flexion, forearm  pronation/supination 2x10 2#      Shoulder Exercises: Pulleys   Flexion 2 minutes    Flexion Limitations to 90    Scaption 2 minutes    Scaption Limitations to 90    ABduction 2 minutes    ABduction Limitations to 90      Vasopneumatic   Number Minutes Vasopneumatic  10 minutes    Vasopnuematic Location  Shoulder    Vasopneumatic Pressure Medium    Vasopneumatic Temperature  34      Manual Therapy   Manual Therapy Passive ROM    Passive ROM L shoulder flexion/abduction to 90, ER to tolerance; no extension/IR                    PT Short Term Goals - 07/19/20 0929      PT SHORT TERM GOAL #1   Title Pt will be I with initial HEP    Time 2    Period Weeks    Status Achieved    Target Date 07/23/20             PT Long Term Goals - 07/09/20 1629      PT LONG TERM GOAL #1   Title  Pt will be I with advanced HEP    Time 8    Period Weeks    Status New    Target Date 09/03/20      PT LONG TERM GOAL #2   Title Pt will demo L shoulder flexion/abduction ROM equivalent to R shoulder    Time 8    Period Weeks    Status New    Target Date 09/03/20      PT LONG TERM GOAL #3   Title Pt will demo L shoulder flexion/abduction MMT 5/5    Time 8    Period Weeks    Status New    Target Date 09/04/20      PT LONG TERM GOAL #4   Title Pt will demo able to perform all household ADLs with no increase in L shoulder pain    Time 8    Period Weeks    Status New    Target Date 09/04/20                 Plan - 07/19/20 5929    Clinical Impression Statement Pt presents to clinic reporting increased pain after last rx. Regressed AAROM to supine with pt improved tolerance. Introduced pulleys flexion/scaption/abduction to ~90 degrees, no pain reported. Gentle PROM to tolerance. No shoulder extension yet. Vaso at end for swelling/pain. Assess response to tx next rx.    PT Treatment/Interventions ADLs/Self Care Home Management;Electrical Stimulation;Iontophoresis  4mg /ml Dexamethasone;Neuromuscular re-education;Therapeutic exercise;Therapeutic activities;Patient/family education;Manual techniques;Passive range of motion;Vasopneumatic Device    PT Next Visit Plan gentle progression of  TE and progress per protocol, manual/modalities as indicated    Consulted and Agree with Plan of Care Patient           Patient will benefit from skilled therapeutic intervention in order to improve the following deficits and impairments:  Decreased range of motion,Increased muscle spasms,Impaired UE functional use,Pain,Hypomobility,Decreased strength,Increased edema,Impaired sensation  Visit Diagnosis: Muscle weakness (generalized)  Localized edema  Stiffness of left shoulder, not elsewhere classified  Acute pain of left shoulder     Problem List Patient Active Problem List   Diagnosis Date Noted  . Osteoarthritis 03/08/2020  . Hypertension   . Hyperlipemia   . Depression 06/10/2017  . Allergic rhinitis 08/21/2013  . Arthralgia of temporomandibular joint 08/21/2013  . Ataxia 08/21/2013  . Constipation 08/21/2013  . Dizziness 08/21/2013  . Edema 08/21/2013  . Esophageal reflux 08/21/2013  . Hypercalcemia 08/21/2013  . 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 Osteopenia 08/21/2013  . 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 Symptomatic menopausal or female climacteric states 08/21/2013  . Abdominal pain 04/04/2009  . Abnormal weight loss 04/04/2009  . Benign essential hypertension 04/04/2009  . Mixed hyperlipidemia 04/04/2009  . Nausea and vomiting 04/04/2009   06/02/2009, PT, DPT Lysle Rubens Jevon Shells 07/19/2020, 9:29 AM  [facility] [address] [place], [place], [place] Phone: [phone]   Fax:  [phone]  Name: [phone] [name] MRN: Michele Date of Birth: 01/24/1941

## 2020-07-24 ENCOUNTER — Other Ambulatory Visit: Payer: Self-pay

## 2020-07-24 ENCOUNTER — Ambulatory Visit: Payer: Medicare Other | Admitting: Physical Therapy

## 2020-07-24 ENCOUNTER — Encounter: Payer: Self-pay | Admitting: Physical Therapy

## 2020-07-24 DIAGNOSIS — R600 Localized edema: Secondary | ICD-10-CM | POA: Diagnosis not present

## 2020-07-24 DIAGNOSIS — M6281 Muscle weakness (generalized): Secondary | ICD-10-CM | POA: Diagnosis not present

## 2020-07-24 DIAGNOSIS — M25612 Stiffness of left shoulder, not elsewhere classified: Secondary | ICD-10-CM

## 2020-07-24 DIAGNOSIS — M25512 Pain in left shoulder: Secondary | ICD-10-CM | POA: Diagnosis not present

## 2020-07-24 NOTE — Therapy (Signed)
[facility] [place] [facility] [address] [place], [place], [address] Phone: [phone]   Fax:  [phone]  Physical Therapy Treatment  Patient Details  Name: Michele Williamson MRN: [id number] Date of Birth: 1940-11-14 Referring Provider (PT): Supple   Encounter Date: 07/24/2020   PT End of Session - 07/24/20 1358    Visit Number 4    Date for PT Re-Evaluation 10/01/20    PT Start Time 1318    PT Stop Time 1405    PT Time Calculation (min) 47 min    Activity Tolerance Patient tolerated treatment well    Behavior During Therapy [facility] for tasks assessed/performed           Past Medical History:  Diagnosis Date  . Arthritis    shoulder, hands , feet, knees,  . Complication of anesthesia   . Heart murmur   . Hyperlipemia   . Hypertension   . PONV (postoperative nausea and vomiting)     Past Surgical History:  Procedure Laterality Date  . BUNIONECTOMY Right   . CARPAL TUNNEL RELEASE    . CATARACT EXTRACTION    . CESAREAN SECTION    . CHOLECYSTECTOMY  2001  . 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 ROTATOR CUFF REPAIR Right     There were no vitals filed for this visit.   Subjective Assessment - 07/24/20 1322    Subjective Pt states no pain today unless she moves a certain way.    Currently in Pain? No/denies    Pain Score 0-No pain    Pain Location Shoulder    Pain Orientation Left              [facility] PT Assessment - 07/24/20 0001      Assessment   Next MD Visit 08/06/2020                         [facility] Adult PT Treatment/Exercise - 07/24/20 0001      Shoulder Exercises: Supine   Other Supine Exercises supine flexion/abd/ER 2x10 with 1# weight bar      Shoulder Exercises: Pulleys   Flexion 2 minutes    Scaption 2 minutes    ABduction 2 minutes       Shoulder Exercises: ROM/Strengthening   UBE (Upper Arm Bike) L1 x 3 min each way with cues for RUE to do most of work      Vasopneumatic   Number Minutes Vasopneumatic  10 minutes    Vasopnuematic Location  Shoulder    Vasopneumatic Pressure Medium    Vasopneumatic Temperature  34      Manual Therapy   Manual Therapy Passive ROM    Passive ROM L shoulder flexion/abduction to 90, ER to tolerance; no extension/IR                    PT Short Term Goals - 07/19/20 0929      PT SHORT TERM GOAL #1   Title Pt will be I with initial HEP    Time 2    Period Weeks    Status Achieved    Target Date 07/23/20             PT Long Term Goals - 07/09/20 1629      PT LONG TERM GOAL #1  Title Pt will be I with advanced HEP    Time 8    Period Weeks    Status New    Target Date 09/03/20      PT LONG TERM GOAL #2   Title Pt will demo L shoulder flexion/abduction ROM equivalent to R shoulder    Time 8    Period Weeks    Status New    Target Date 09/03/20      PT LONG TERM GOAL #3   Title Pt will demo L shoulder flexion/abduction MMT 5/5    Time 8    Period Weeks    Status New    Target Date 09/04/20      PT LONG TERM GOAL #4   Title Pt will demo able to perform all household ADLs with no increase in L shoulder pain    Time 8    Period Weeks    Status New    Target Date 09/04/20                 Plan - 07/24/20 1358    Clinical Impression Statement Very gentle progression of TE today; pt tolerated fairly well. A few instances of increased pain with shoulder AAROM and passive shoulder stretching. Progressed to UBE with education on letting RUE do most of the work, pt tolerated well. No shoulder extension yet. Vaso at end for swelling/pain. Continue gentle progression to tolerance.    PT Treatment/Interventions ADLs/Self Care Home Management;Electrical Stimulation;Iontophoresis 4mg /ml Dexamethasone;Neuromuscular re-education;Therapeutic exercise;Therapeutic  activities;Patient/family education;Manual techniques;Passive range of motion;Vasopneumatic Device    PT Next Visit Plan gentle progression of  TE and progress per protocol, manual/modalities as indicated    Consulted and Agree with Plan of Care Patient           Patient will benefit from skilled therapeutic intervention in order to improve the following deficits and impairments:  Decreased range of motion,Increased muscle spasms,Impaired UE functional use,Pain,Hypomobility,Decreased strength,Increased edema,Impaired sensation  Visit Diagnosis: Muscle weakness (generalized)  Localized edema  Stiffness of left shoulder, not elsewhere classified  Acute pain of left shoulder     Problem List Patient Active Problem List   Diagnosis Date Noted  . Osteoarthritis 03/08/2020  . Hypertension   . Hyperlipemia   . Depression 06/10/2017  . Allergic rhinitis 08/21/2013  . Arthralgia of temporomandibular joint 08/21/2013  . Ataxia 08/21/2013  . Constipation 08/21/2013  . Dizziness 08/21/2013  . Edema 08/21/2013  . Esophageal reflux 08/21/2013  . Hypercalcemia 08/21/2013  . 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 Osteopenia 08/21/2013  . 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 Symptomatic menopausal or female climacteric states 08/21/2013  . Abdominal pain 04/04/2009  . Abnormal weight loss 04/04/2009  . Benign essential hypertension 04/04/2009  . Mixed hyperlipidemia 04/04/2009  . Nausea and vomiting 04/04/2009   06/02/2009, PT, DPT Lysle Rubens Davone Shinault 07/24/2020, 2:00 PM  [facility] [place] [facility] [address] [place], [place], [place] Phone: [phone]   Fax:  [phone]  Name: [phone] M Laprise MRN: Michele Date of Birth: [birth year]

## 2020-07-26 ENCOUNTER — Ambulatory Visit: Payer: Medicare Other | Admitting: Physical Therapy

## 2020-07-26 ENCOUNTER — Encounter: Payer: Self-pay | Admitting: Physical Therapy

## 2020-07-26 ENCOUNTER — Other Ambulatory Visit: Payer: Self-pay

## 2020-07-26 DIAGNOSIS — M25512 Pain in left shoulder: Secondary | ICD-10-CM

## 2020-07-26 DIAGNOSIS — R600 Localized edema: Secondary | ICD-10-CM | POA: Diagnosis not present

## 2020-07-26 DIAGNOSIS — M6281 Muscle weakness (generalized): Secondary | ICD-10-CM | POA: Diagnosis not present

## 2020-07-26 DIAGNOSIS — M25612 Stiffness of left shoulder, not elsewhere classified: Secondary | ICD-10-CM | POA: Diagnosis not present

## 2020-07-26 NOTE — Therapy (Signed)
[facility] [place] [facility] [address] [place], [place], [address] Phone: [phone]   Fax:  [phone]  Physical Therapy Treatment  Patient Details  Name: Michele Williamson MRN: [id number] Date of Birth: 02/21/1941 Referring Provider (PT): Supple   Encounter Date: 07/26/2020   PT End of Session - 07/26/20 1418    Visit Number 5    Date for PT Re-Evaluation 10/01/20    PT Start Time 1345    PT Stop Time 1428    PT Time Calculation (min) 43 min    Activity Tolerance Patient tolerated treatment well    Behavior During Therapy [facility] for tasks assessed/performed           Past Medical History:  Diagnosis Date  . Arthritis    shoulder, hands , feet, knees,  . Complication of anesthesia   . Heart murmur   . Hyperlipemia   . Hypertension   . PONV (postoperative nausea and vomiting)     Past Surgical History:  Procedure Laterality Date  . BUNIONECTOMY Right   . CARPAL TUNNEL RELEASE    . CATARACT EXTRACTION    . CESAREAN SECTION    . CHOLECYSTECTOMY  2001  . 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 ROTATOR CUFF REPAIR Right     There were no vitals filed for this visit.   Subjective Assessment - 07/26/20 1347    Subjective "Im doing"    Currently in Pain? Yes    Pain Score 3     Pain Location Shoulder   & thumb   Pain Orientation Left                             [facility] Adult PT Treatment/Exercise - 07/26/20 0001      Shoulder Exercises: Standing   Extension Strengthening;Theraband;20 reps;Both    Theraband Level (Shoulder Extension) Level 1 (Yellow)    Row Strengthening;Both;20 reps;Theraband    Theraband Level (Shoulder Row) Level 1 (Yellow)    Other Standing Exercises AAROM flex, Ext, IR up back x10      Shoulder Exercises: Pulleys   Flexion 2 minutes     Scaption 2 minutes    ABduction 2 minutes    Other Pulley Exercises IR x10      Shoulder Exercises: ROM/Strengthening   UBE (Upper Arm Bike) L1 x 3 min each way with cues for RUE to do most of work      Vasopneumatic   Number Minutes Vasopneumatic  10 minutes    Vasopnuematic Location  Shoulder    Vasopneumatic Pressure Medium    Vasopneumatic Temperature  34      Manual Therapy   Manual Therapy Passive ROM    Passive ROM L shoulder flexion/abduction to 90, ER to tolerance; no extension/IR                    PT Short Term Goals - 07/26/20 1425      PT SHORT TERM GOAL #1   Title Pt will be I with initial HEP             PT Long Term Goals - 07/09/20 1629      PT LONG TERM GOAL #1   Title Pt will be I with advanced HEP    Time 8  Period Weeks    Status New    Target Date 09/03/20      PT LONG TERM GOAL #2   Title Pt will demo L shoulder flexion/abduction ROM equivalent to R shoulder    Time 8    Period Weeks    Status New    Target Date 09/03/20      PT LONG TERM GOAL #3   Title Pt will demo L shoulder flexion/abduction MMT 5/5    Time 8    Period Weeks    Status New    Target Date 09/04/20      PT LONG TERM GOAL #4   Title Pt will demo able to perform all household ADLs with no increase in L shoulder pain    Time 8    Period Weeks    Status New    Target Date 09/04/20                 Plan - 07/26/20 1419    Clinical Impression Statement Pt able to progress to some scapular stabilization interventions under light resistance. Some difficulty reported with active assisted shoulder flexion. Again instructed Pt to let RUE do most of the work on UBE. Pt had difficulty relaxing and allowing passive ROM.    Examination-Activity Limitations Lift;Carry;Reach Overhead    Examination-Participation Restrictions Community Activity;Interpersonal Relationship;Laundry;Cleaning;Meal Prep;Yard Work    Conservation officer, historic buildings  Evolving/Moderate complexity    Rehab Potential Good    PT Frequency 2x / week    PT Duration 8 weeks    PT Treatment/Interventions ADLs/Self Care Home Management;Electrical Stimulation;Iontophoresis 4mg /ml Dexamethasone;Neuromuscular re-education;Therapeutic exercise;Therapeutic activities;Patient/family education;Manual techniques;Passive range of motion;Vasopneumatic Device    PT Next Visit Plan gentle progression of  TE and progress per protocol, manual/modalities as indicated           Patient will benefit from skilled therapeutic intervention in order to improve the following deficits and impairments:  Decreased range of motion,Increased muscle spasms,Impaired UE functional use,Pain,Hypomobility,Decreased strength,Increased edema,Impaired sensation  Visit Diagnosis: Muscle weakness (generalized)  Localized edema  Stiffness of left shoulder, not elsewhere classified  Acute pain of left shoulder     Problem List Patient Active Problem List   Diagnosis Date Noted  . Osteoarthritis 03/08/2020  . Hypertension   . Hyperlipemia   . Depression 06/10/2017  . Allergic rhinitis 08/21/2013  . Arthralgia of temporomandibular joint 08/21/2013  . Ataxia 08/21/2013  . Constipation 08/21/2013  . Dizziness 08/21/2013  . Edema 08/21/2013  . Esophageal reflux 08/21/2013  . Hypercalcemia 08/21/2013  . 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 Osteopenia 08/21/2013  . 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 Symptomatic menopausal or female climacteric states 08/21/2013  . Abdominal pain 04/04/2009  . Abnormal weight loss 04/04/2009  . Benign essential hypertension 04/04/2009  . Mixed hyperlipidemia 04/04/2009  . Nausea and vomiting 04/04/2009    06/02/2009 07/26/2020, 2:26 PM  [facility] [place] [facility] [address] [place], [place], [place] Phone: [phone]   Fax:  [phone]  Name: [phone] M  [name] MRN: Michele Date of Birth: 1940-08-19

## 2020-07-31 ENCOUNTER — Other Ambulatory Visit: Payer: Self-pay

## 2020-07-31 ENCOUNTER — Ambulatory Visit: Payer: Medicare Other | Admitting: Physical Therapy

## 2020-07-31 ENCOUNTER — Encounter: Payer: Self-pay | Admitting: Physical Therapy

## 2020-07-31 DIAGNOSIS — M25512 Pain in left shoulder: Secondary | ICD-10-CM | POA: Diagnosis not present

## 2020-07-31 DIAGNOSIS — M25612 Stiffness of left shoulder, not elsewhere classified: Secondary | ICD-10-CM | POA: Diagnosis not present

## 2020-07-31 DIAGNOSIS — R600 Localized edema: Secondary | ICD-10-CM | POA: Diagnosis not present

## 2020-07-31 DIAGNOSIS — M6281 Muscle weakness (generalized): Secondary | ICD-10-CM | POA: Diagnosis not present

## 2020-07-31 NOTE — Therapy (Signed)
[facility] [place] [facility] [address] [place], [place], [address] Phone: [phone]   Fax:  [phone]  Physical Therapy Treatment  Patient Details  Name: Michele Williamson MRN: [id number] Date of Birth: Apr 19, 1940 Referring Provider (PT): Supple   Encounter Date: 07/31/2020   PT End of Session - 07/31/20 1418    Visit Number 6    Date for PT Re-Evaluation 10/01/20    PT Start Time 1345    PT Stop Time 1430    PT Time Calculation (min) 45 min    Activity Tolerance Patient tolerated treatment well    Behavior During Therapy [facility] for tasks assessed/performed           Past Medical History:  Diagnosis Date  . Arthritis    shoulder, hands , feet, knees,  . Complication of anesthesia   . Heart murmur   . Hyperlipemia   . Hypertension   . PONV (postoperative nausea and vomiting)     Past Surgical History:  Procedure Laterality Date  . BUNIONECTOMY Right   . CARPAL TUNNEL RELEASE    . CATARACT EXTRACTION    . CESAREAN SECTION    . CHOLECYSTECTOMY  2001  . 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 ROTATOR CUFF REPAIR Right     There were no vitals filed for this visit.   Subjective Assessment - 07/31/20 1347    Subjective L shoulder is sore today, was in the car for over 20 hours due to a long road trip unabe to do her exercises over the weekend    Currently in Pain? Yes    Pain Score 4     Pain Location Shoulder    Pain Orientation Left                             [facility] Adult PT Treatment/Exercise - 07/31/20 0001      Shoulder Exercises: Supine   Flexion Right;AROM;Left;5 reps      Shoulder Exercises: Seated   External Rotation Left;10 reps;Theraband    Theraband Level (Shoulder External Rotation) Level 1 (Yellow)    Internal Rotation  Strengthening;Left;20 reps;Theraband    Theraband Level (Shoulder Internal Rotation) Level 1 (Yellow)      Shoulder Exercises: Standing   Extension Strengthening;Theraband;20 reps;Both    Theraband Level (Shoulder Extension) Level 2 (Red)    Row Strengthening;Both;20 reps;Theraband    Theraband Level (Shoulder Row) Level 2 (Red)    Other Standing Exercises AAROM flex, Ext, IR up back x10    Other Standing Exercises AAROM 2 level cabinet raches x 5      Shoulder Exercises: ROM/Strengthening   UBE (Upper Arm Bike) L1 x 3 min each way with cues for RUE to do most of work      Vasopneumatic   Number Minutes Vasopneumatic  10 minutes    Vasopnuematic Location  Shoulder    Vasopneumatic Pressure Medium    Vasopneumatic Temperature  34      Manual Therapy   Manual Therapy Passive ROM    Passive ROM L shoulder flexion/abduction to 90, ER to tolerance; no extension/IR                    PT Short Term Goals - 07/26/20 1425      PT SHORT  TERM GOAL #1   Title Pt will be I with initial HEP             PT Long Term Goals - 07/09/20 1629      PT LONG TERM GOAL #1   Title Pt will be I with advanced HEP    Time 8    Period Weeks    Status New    Target Date 09/03/20      PT LONG TERM GOAL #2   Title Pt will demo L shoulder flexion/abduction ROM equivalent to R shoulder    Time 8    Period Weeks    Status New    Target Date 09/03/20      PT LONG TERM GOAL #3   Title Pt will demo L shoulder flexion/abduction MMT 5/5    Time 8    Period Weeks    Status New    Target Date 09/04/20      PT LONG TERM GOAL #4   Title Pt will demo able to perform all household ADLs with no increase in L shoulder pain    Time 8    Period Weeks    Status New    Target Date 09/04/20                 Plan - 07/31/20 1419    Clinical Impression Statement Pt enters clinic reporting some additional L shoulder soreness. Good carryover form last session with standing rows and  extensions. Limited ROM with active external rotation under light load. She was able to reach the second level in cabinet with some assist. Some pain at the eng rang of PROM.    Examination-Activity Limitations Lift;Carry;Reach Overhead    Examination-Participation Restrictions Community Activity;Interpersonal Relationship;Laundry;Cleaning;Meal Prep;Yard Work    Conservation officer, historic buildings Evolving/Moderate complexity    Rehab Potential Good    PT Frequency 2x / week    PT Duration 8 weeks    PT Treatment/Interventions ADLs/Self Care Home Management;Electrical Stimulation;Iontophoresis 4mg /ml Dexamethasone;Neuromuscular re-education;Therapeutic exercise;Therapeutic activities;Patient/family education;Manual techniques;Passive range of motion;Vasopneumatic Device    PT Next Visit Plan gentle progression of  TE and progress per protocol, manual/modalities as indicated           Patient will benefit from skilled therapeutic intervention in order to improve the following deficits and impairments:  Decreased range of motion,Increased muscle spasms,Impaired UE functional use,Pain,Hypomobility,Decreased strength,Increased edema,Impaired sensation  Visit Diagnosis: Stiffness of left shoulder, not elsewhere classified  Localized edema  Muscle weakness (generalized)  Acute pain of left shoulder     Problem List Patient Active Problem List   Diagnosis Date Noted  . Osteoarthritis 03/08/2020  . Hypertension   . Hyperlipemia   . Depression 06/10/2017  . Allergic rhinitis 08/21/2013  . Arthralgia of temporomandibular joint 08/21/2013  . Ataxia 08/21/2013  . Constipation 08/21/2013  . Dizziness 08/21/2013  . Edema 08/21/2013  . Esophageal reflux 08/21/2013  . Hypercalcemia 08/21/2013  . 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 Osteopenia 08/21/2013  . 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 Symptomatic menopausal or female climacteric states  08/21/2013  . Abdominal pain 04/04/2009  . Abnormal weight loss 04/04/2009  . Benign essential hypertension 04/04/2009  . Mixed hyperlipidemia 04/04/2009  . Nausea and vomiting 04/04/2009    06/02/2009, PTA 07/31/2020, 2:22 PM  [facility] [place] [facility] [address] [place], [place], [place] Phone: [phone]   Fax:  [phone]  Name: [phone] M Karner MRN: Michele Date of  Birth: 17-Feb-1941

## 2020-08-02 ENCOUNTER — Ambulatory Visit: Payer: Medicare Other | Attending: Orthopedic Surgery | Admitting: Physical Therapy

## 2020-08-02 ENCOUNTER — Encounter: Payer: Self-pay | Admitting: Physical Therapy

## 2020-08-02 ENCOUNTER — Other Ambulatory Visit: Payer: Self-pay

## 2020-08-02 DIAGNOSIS — M6281 Muscle weakness (generalized): Secondary | ICD-10-CM | POA: Diagnosis not present

## 2020-08-02 DIAGNOSIS — M25512 Pain in left shoulder: Secondary | ICD-10-CM | POA: Insufficient documentation

## 2020-08-02 DIAGNOSIS — R600 Localized edema: Secondary | ICD-10-CM | POA: Insufficient documentation

## 2020-08-02 DIAGNOSIS — M25612 Stiffness of left shoulder, not elsewhere classified: Secondary | ICD-10-CM | POA: Diagnosis not present

## 2020-08-02 NOTE — Therapy (Signed)
[facility] [address] [place], [place], [address] Phone: [phone]   Fax:  [phone]  Physical Therapy Treatment  Patient Details  Name: Michele Williamson Williamson MRN: [id number] Date of Birth: 03/23/1940 Referring Provider (Michele Williamson): Supple   Encounter Date: 08/02/2020   Michele Williamson End of Session - 08/02/20 1442    Visit Number 7    Date for Michele Williamson Re-Evaluation 10/01/20    Michele Williamson Start Time 1400    Michele Williamson Stop Time 1450    Michele Williamson Time Calculation (min) 50 min    Activity Tolerance Patient tolerated treatment well    Behavior During Therapy [facility] for tasks assessed/performed           Past Medical History:  Diagnosis Date  . Arthritis    shoulder, hands , feet, knees,  . Complication of anesthesia   . Heart murmur   . Hyperlipemia   . Hypertension   . PONV (postoperative nausea and vomiting)     Past Surgical History:  Procedure Laterality Date  . BUNIONECTOMY Right   . CARPAL TUNNEL RELEASE    . CATARACT EXTRACTION    . CESAREAN SECTION    . CHOLECYSTECTOMY  2001  . 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 ROTATOR CUFF REPAIR Right     There were no vitals filed for this visit.   Subjective Assessment - 08/02/20 1401    Subjective Michele Williamson reports L shoulder still sore; difficulties with trying to lift arm and increased soreness    Currently in Pain? Yes    Pain Score 3     Pain Location Shoulder    Pain Orientation Left                             [facility] Adult Michele Williamson Treatment/Exercise - 08/02/20 0001      Shoulder Exercises: Supine   Other Supine Exercises supine flexion/abd/ER x10 with 1# weight bar      Shoulder Exercises: Standing   Extension Strengthening;Theraband;20 reps;Both    Theraband Level (Shoulder Extension) Level 2 (Red)    Row Strengthening;Both;20  reps;Theraband    Theraband Level (Shoulder Row) Level 2 (Red)    Other Standing Exercises finger ladder flexion/abduction x10      Shoulder Exercises: Pulleys   Flexion 1 minute    Scaption 2 minutes    ABduction 2 minutes      Shoulder Exercises: ROM/Strengthening   UBE (Upper Arm Bike) L1 x 3 min each way with cues for RUE to do most of work      Vasopneumatic   Number Minutes Vasopneumatic  10 minutes    Vasopnuematic Location  Shoulder    Vasopneumatic Pressure Medium    Vasopneumatic Temperature  34      Manual Therapy   Manual Therapy Passive ROM    Passive ROM L shoulder flexion/abduction/IR to tolerance; no extension/IR                    Michele Williamson Short Term Goals - 07/26/20 1425      Michele Williamson SHORT TERM GOAL #1   Title Michele Williamson will be I with initial HEP             Michele Williamson Long Term Goals - 08/02/20 1444      Michele Williamson LONG TERM GOAL #1  Title Michele Williamson will be I with advanced HEP    Time 8    Period Weeks    Status On-going      Michele Williamson LONG TERM GOAL #2   Title Michele Williamson will demo L shoulder flexion/abduction ROM equivalent to R shoulder    Time 8    Period Weeks    Status On-going      Michele Williamson LONG TERM GOAL #3   Title Michele Williamson will demo L shoulder flexion/abduction MMT 5/5    Time 8    Period Weeks    Status On-going      Michele Williamson LONG TERM GOAL #4   Title Michele Williamson will demo able to perform all household ADLs with no increase in L shoulder pain    Time 8    Period Weeks    Status On-going                 Plan - 08/02/20 1442    Clinical Impression Statement Continued work progressing passive and active ROM; difficulty with finger ladder into abduction. Cues for muscle relaxation with PROM and limited d/t increase pain at end ranges. Michele Williamson is 5 weeks post surgery today; continue to progress per protocol.    Michele Williamson Treatment/Interventions ADLs/Self Care Home Management;Electrical Stimulation;Iontophoresis 4mg /ml Dexamethasone;Neuromuscular re-education;Therapeutic exercise;Therapeutic  activities;Patient/family education;Manual techniques;Passive range of motion;Vasopneumatic Device    Michele Williamson Next Visit Plan 5 weeks post surgery today (6/2), gentle progression of  TE and progress per protocol, manual/modalities as indicated    Consulted and Agree with Plan of Care Patient           Patient will benefit from skilled therapeutic intervention in order to improve the following deficits and impairments:  Decreased range of motion,Increased muscle spasms,Impaired UE functional use,Pain,Hypomobility,Decreased strength,Increased edema,Impaired sensation  Visit Diagnosis: Stiffness of left shoulder, not elsewhere classified  Localized edema  Muscle weakness (generalized)  Acute pain of left shoulder     Problem List Patient Active Problem List   Diagnosis Date Noted  . Osteoarthritis 03/08/2020  . Hypertension   . Hyperlipemia   . Depression 06/10/2017  . Allergic rhinitis 08/21/2013  . Arthralgia of temporomandibular joint 08/21/2013  . Ataxia 08/21/2013  . Constipation 08/21/2013  . Dizziness 08/21/2013  . Edema 08/21/2013  . Esophageal reflux 08/21/2013  . Hypercalcemia 08/21/2013  . 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 Osteopenia 08/21/2013  . 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 Symptomatic menopausal or female climacteric states 08/21/2013  . Abdominal pain 04/04/2009  . Abnormal weight loss 04/04/2009  . Benign essential hypertension 04/04/2009  . Mixed hyperlipidemia 04/04/2009  . Nausea and vomiting 04/04/2009   06/02/2009, Michele Williamson, Michele Williamson Michele Williamson Williamson Michele Williamson Williamson 08/02/2020, 2:46 PM  [facility] [address] [place], [place], [place] Phone: [phone]   Fax:  [phone]  Name: [phone] Michele Williamson Michele Williamson MRN: Michele Williamson Date of Birth: 1940/07/05

## 2020-08-05 ENCOUNTER — Other Ambulatory Visit: Payer: Self-pay | Admitting: Family

## 2020-08-06 ENCOUNTER — Encounter: Payer: Self-pay | Admitting: Family

## 2020-08-07 ENCOUNTER — Ambulatory Visit: Payer: Medicare Other | Admitting: Physical Therapy

## 2020-08-07 ENCOUNTER — Encounter: Payer: Self-pay | Admitting: Physical Therapy

## 2020-08-07 ENCOUNTER — Other Ambulatory Visit: Payer: Self-pay

## 2020-08-07 DIAGNOSIS — M6281 Muscle weakness (generalized): Secondary | ICD-10-CM

## 2020-08-07 DIAGNOSIS — M25612 Stiffness of left shoulder, not elsewhere classified: Secondary | ICD-10-CM | POA: Diagnosis not present

## 2020-08-07 DIAGNOSIS — M25512 Pain in left shoulder: Secondary | ICD-10-CM

## 2020-08-07 DIAGNOSIS — R600 Localized edema: Secondary | ICD-10-CM

## 2020-08-07 NOTE — Therapy (Signed)
[facility] [place] [address] [place], [place], [address] Phone: [phone]   Fax:  [phone]  Physical Therapy Treatment  Patient Details  Name: Michele Williamson MRN: [id number] Date of Birth: [birth year] Referring Provider (PT): Supple   Encounter Date: 08/07/2020   PT End of Session - 08/07/20 1420    Visit Number 8    Date for PT Re-Evaluation 10/01/20    PT Start Time 1345    PT Stop Time 1430    PT Time Calculation (min) 45 min    Activity Tolerance Patient tolerated treatment well    Behavior During Therapy [facility] for tasks assessed/performed           Past Medical History:  Diagnosis Date  . Arthritis    shoulder, hands , feet, knees,  . Complication of anesthesia   . Heart murmur   . Hyperlipemia   . Hypertension   . PONV (postoperative nausea and vomiting)     Past Surgical History:  Procedure Laterality Date  . BUNIONECTOMY Right   . CARPAL TUNNEL RELEASE    . CATARACT EXTRACTION    . CESAREAN SECTION    . CHOLECYSTECTOMY  2001  . 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 ROTATOR CUFF REPAIR Right     There were no vitals filed for this visit.   Subjective Assessment - 08/07/20 1343    Subjective Pt reports doing well today, no pain at rest.    Currently in Pain? No/denies    Pain Score 0-No pain    Pain Location Shoulder    Pain Orientation Left                             [facility] Adult PT Treatment/Exercise - 08/07/20 0001      Shoulder Exercises: Seated   Other Seated Exercises shoulder flexion aarom with 1# weight bar x10      Shoulder Exercises: Standing   Extension Strengthening;Theraband;20 reps;Both    Theraband Level (Shoulder Extension) Level 2 (Red)    Row Strengthening;Both;20 reps;Theraband    Theraband Level (Shoulder  Row) Level 2 (Red)    Other Standing Exercises finger ladder flexion/abduction x10    Other Standing Exercises isometric walkouts yellow TB IR/ER x10      Shoulder Exercises: Pulleys   Flexion 2 minutes    ABduction 2 minutes      Shoulder Exercises: ROM/Strengthening   UBE (Upper Arm Bike) L1 x 3 min each way with cues for RUE to do most of work      Vasopneumatic   Number Minutes Vasopneumatic  10 minutes    Vasopnuematic Location  Shoulder    Vasopneumatic Pressure Medium    Vasopneumatic Temperature  34      Manual Therapy   Manual Therapy --    Passive ROM --                    PT Short Term Goals - 07/26/20 1425      PT SHORT TERM GOAL #1   Title Pt will be I with initial HEP             PT Long Term Goals - 08/02/20 1444      PT LONG TERM GOAL #1   Title Pt will be  I with advanced HEP    Time 8    Period Weeks    Status On-going      PT LONG TERM GOAL #2   Title Pt will demo L shoulder flexion/abduction ROM equivalent to R shoulder    Time 8    Period Weeks    Status On-going      PT LONG TERM GOAL #3   Title Pt will demo L shoulder flexion/abduction MMT 5/5    Time 8    Period Weeks    Status On-going      PT LONG TERM GOAL #4   Title Pt will demo able to perform all household ADLs with no increase in L shoulder pain    Time 8    Period Weeks    Status On-going                 Plan - 08/07/20 1421    Clinical Impression Statement Pt with improved ROM on finger ladder this rx; still with increased difficulty with abduction. Did well with all other ex's reporting only mild increased in soreness of L shoulder. Vaso for pain relief/edema at the end. Continue to progress per protocol.    PT Treatment/Interventions ADLs/Self Care Home Management;Electrical Stimulation;Iontophoresis 4mg /ml Dexamethasone;Neuromuscular re-education;Therapeutic exercise;Therapeutic activities;Patient/family education;Manual techniques;Passive range of  motion;Vasopneumatic Device    PT Next Visit Plan 5 weeks post surgery as of 6/2, gentle progression of  TE and progress per protocol, manual/modalities as indicated    Consulted and Agree with Plan of Care Patient           Patient will benefit from skilled therapeutic intervention in order to improve the following deficits and impairments:  Decreased range of motion,Increased muscle spasms,Impaired UE functional use,Pain,Hypomobility,Decreased strength,Increased edema,Impaired sensation  Visit Diagnosis: Stiffness of left shoulder, not elsewhere classified  Localized edema  Muscle weakness (generalized)  Acute pain of left shoulder     Problem List Patient Active Problem List   Diagnosis Date Noted  . Osteoarthritis 03/08/2020  . Hypertension   . Hyperlipemia   . Depression 06/10/2017  . Allergic rhinitis 08/21/2013  . Arthralgia of temporomandibular joint 08/21/2013  . Ataxia 08/21/2013  . Constipation 08/21/2013  . Dizziness 08/21/2013  . Edema 08/21/2013  . Esophageal reflux 08/21/2013  . Hypercalcemia 08/21/2013  . 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 Osteopenia 08/21/2013  . 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 Symptomatic menopausal or female climacteric states 08/21/2013  . Abdominal pain 04/04/2009  . Abnormal weight loss 04/04/2009  . Benign essential hypertension 04/04/2009  . Mixed hyperlipidemia 04/04/2009  . Nausea and vomiting 04/04/2009   06/02/2009, PT, DPT Lysle Rubens Gilbert Manolis 08/07/2020, 2:23 PM  [facility] [place] [facility] [address] [place], [place], [place] Phone: [phone]   Fax:  [phone]  Name: [phone] Michele Williamson MRN: Michele Date of Birth: [birth year]

## 2020-08-09 ENCOUNTER — Other Ambulatory Visit: Payer: Self-pay

## 2020-08-09 ENCOUNTER — Encounter: Payer: Self-pay | Admitting: Physical Therapy

## 2020-08-09 ENCOUNTER — Ambulatory Visit: Payer: Medicare Other | Admitting: Physical Therapy

## 2020-08-09 DIAGNOSIS — M25612 Stiffness of left shoulder, not elsewhere classified: Secondary | ICD-10-CM | POA: Diagnosis not present

## 2020-08-09 DIAGNOSIS — M6281 Muscle weakness (generalized): Secondary | ICD-10-CM

## 2020-08-09 DIAGNOSIS — M25512 Pain in left shoulder: Secondary | ICD-10-CM

## 2020-08-09 DIAGNOSIS — R600 Localized edema: Secondary | ICD-10-CM | POA: Diagnosis not present

## 2020-08-09 NOTE — Therapy (Signed)
[facility] [address] [place], [place], [address] Phone: [phone]   Fax:  [phone]  Physical Therapy Treatment  Patient Details  Name: Michele Williamson MRN: [id number] Date of Birth: 08-Oct-1940 Referring Provider (PT): Supple   Encounter Date: 08/09/2020   PT End of Session - 08/09/20 1415     Visit Number 9    Date for PT Re-Evaluation 10/01/20    PT Start Time 1340    PT Stop Time 1427    PT Time Calculation (min) 47 min    Activity Tolerance Patient tolerated treatment well    Behavior During Therapy [facility] for tasks assessed/performed             Past Medical History:  Diagnosis Date   Arthritis    shoulder, hands , feet, knees,   Complication of anesthesia    Heart murmur    Hyperlipemia    Hypertension    PONV (postoperative nausea and vomiting)     Past Surgical History:  Procedure Laterality Date   BUNIONECTOMY Right    CARPAL TUNNEL RELEASE     CATARACT EXTRACTION     CESAREAN SECTION     CHOLECYSTECTOMY  2001   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   Subjective Assessment - 08/09/20 1344     Subjective Pt reports doing well today, no pain at rest.    Currently in Pain? No/denies                               [facility] Adult PT Treatment/Exercise - 08/09/20 0001       Shoulder Exercises: Standing   Extension Strengthening;Theraband;20 reps;Both    Theraband Level (Shoulder Extension) Level 2 (Red)    Row Strengthening;Both;20 reps;Theraband    Theraband Level (Shoulder Row) Level 2 (Red)    Other Standing Exercises finger ladder flexionx10 abduction x5    Other Standing Exercises isometric walkouts yellow TB IR/ER x10      Shoulder Exercises:  Pulleys   Flexion 2 minutes    ABduction 2 minutes      Shoulder Exercises: ROM/Strengthening   UBE (Upper Arm Bike) L1 x 3 min each way with cues for RUE to do most of work      Vasopneumatic   Number Minutes Vasopneumatic  10 minutes    Vasopnuematic Location  Shoulder    Vasopneumatic Pressure Medium    Vasopneumatic Temperature  34      Manual Therapy   Manual Therapy Passive ROM    Passive ROM L shoulder flexion/abduction/ER to tolerance; no extension/IR                      PT Short Term Goals - 07/26/20 1425       PT SHORT TERM GOAL #1   Title Pt will be I with initial HEP               PT Long Term Goals - 08/02/20 1444       PT LONG TERM GOAL #1   Title Pt will be I with advanced HEP    Time 8    Period Weeks    Status On-going  PT LONG TERM GOAL #2   Title Pt will demo L shoulder flexion/abduction ROM equivalent to R shoulder    Time 8    Period Weeks    Status On-going      PT LONG TERM GOAL #3   Title Pt will demo L shoulder flexion/abduction MMT 5/5    Time 8    Period Weeks    Status On-going      PT LONG TERM GOAL #4   Title Pt will demo able to perform all household ADLs with no increase in L shoulder pain    Time 8    Period Weeks    Status On-going                   Plan - 08/09/20 1416     Clinical Impression Statement Pt continues to demo improvements in passive ROM along with decreased pain with gentle AAROM ex's. Cues to avoid compensations with shoulder abduction finger ladder. Cues for proper form with scap stab ex's. Start progressing into more AAROM and AROM per protocol. Vaso for pain relief/edema at end of session.    PT Treatment/Interventions ADLs/Self Care Home Management;Electrical Stimulation;Iontophoresis 4mg /ml Dexamethasone;Neuromuscular re-education;Therapeutic exercise;Therapeutic activities;Patient/family education;Manual techniques;Passive range of motion;Vasopneumatic Device    PT Next  Visit Plan 6 weeks post surgery as of 6/9, gentle progression of  TE and progress per protocol, manual/modalities as indicated    Consulted and Agree with Plan of Care Patient             Patient will benefit from skilled therapeutic intervention in order to improve the following deficits and impairments:  Decreased range of motion, Increased muscle spasms, Impaired UE functional use, Pain, Hypomobility, Decreased strength, Increased edema, Impaired sensation  Visit Diagnosis: Stiffness of left shoulder, not elsewhere classified  Localized edema  Muscle weakness (generalized)  Acute pain of left shoulder     Problem List Patient Active Problem List   Diagnosis Date Noted   Osteoarthritis 03/08/2020   Hypertension    Hyperlipemia    Depression 06/10/2017   Allergic rhinitis 08/21/2013   Arthralgia of temporomandibular joint 08/21/2013   Ataxia 08/21/2013   Constipation 08/21/2013   Dizziness 08/21/2013   Edema 08/21/2013   Esophageal reflux 08/21/2013   Hypercalcemia 08/21/2013   Intervertebral disc degeneration 08/21/2013   Osteopenia 08/21/2013   Primary osteoarthritis involving multiple joints 08/21/2013   Symptomatic menopausal or female climacteric states 08/21/2013   Abdominal pain 04/04/2009   Abnormal weight loss 04/04/2009   Benign essential hypertension 04/04/2009   Mixed hyperlipidemia 04/04/2009   Nausea and vomiting 04/04/2009   06/02/2009, PT, DPT Lysle Rubens Tuyet Bader 08/09/2020, 2:18 PM  [facility] [place] [facility] [address] [place], [place], [place] Phone: [phone]   Fax:  [phone]  Name: [phone] M Ribera MRN: Michele Date of Birth: [birth year]

## 2020-08-14 ENCOUNTER — Encounter: Payer: Medicare Other | Admitting: Physical Therapy

## 2020-08-16 ENCOUNTER — Encounter: Payer: Medicare Other | Admitting: Physical Therapy

## 2020-08-16 ENCOUNTER — Other Ambulatory Visit: Payer: Self-pay | Admitting: Cardiology

## 2020-08-21 ENCOUNTER — Ambulatory Visit: Payer: Medicare Other | Admitting: Physical Therapy

## 2020-08-23 ENCOUNTER — Ambulatory Visit: Payer: Medicare Other | Admitting: Physical Therapy

## 2020-08-27 ENCOUNTER — Ambulatory Visit: Payer: Medicare Other | Admitting: Physical Therapy

## 2020-08-27 ENCOUNTER — Other Ambulatory Visit: Payer: Self-pay

## 2020-08-27 ENCOUNTER — Encounter: Payer: Self-pay | Admitting: Physical Therapy

## 2020-08-27 DIAGNOSIS — M25612 Stiffness of left shoulder, not elsewhere classified: Secondary | ICD-10-CM

## 2020-08-27 DIAGNOSIS — M25512 Pain in left shoulder: Secondary | ICD-10-CM | POA: Diagnosis not present

## 2020-08-27 DIAGNOSIS — R600 Localized edema: Secondary | ICD-10-CM

## 2020-08-27 DIAGNOSIS — M6281 Muscle weakness (generalized): Secondary | ICD-10-CM | POA: Diagnosis not present

## 2020-08-27 NOTE — Therapy (Signed)
[place]. [place], [place], [address] Phone: [phone]   Fax:  [phone]  Physical Therapy Treatment Progress Note Reporting Period 07/09/20 to 08/27/20  See note below for Objective Data and Assessment of Progress/Goals.     Patient Details  Name: Michele Williamson MRN: [id number] Date of Birth: [birth year] Referring Provider (PT): Supple   Encounter Date: 08/27/2020   PT End of Session - 08/27/20 1341     Visit Number 10    Date for PT Re-Evaluation 10/01/20    PT Start Time 1302    PT Stop Time 1347    PT Time Calculation (min) 45 min    Activity Tolerance Patient tolerated treatment well    Behavior During Therapy [facility] for tasks assessed/performed             Past Medical History:  Diagnosis Date   Arthritis    shoulder, hands , feet, knees,   Complication of anesthesia    Heart murmur    Hyperlipemia    Hypertension    PONV (postoperative nausea and vomiting)     Past Surgical History:  Procedure Laterality Date   BUNIONECTOMY Right    CARPAL TUNNEL RELEASE     CATARACT EXTRACTION     CESAREAN SECTION     CHOLECYSTECTOMY  2001   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   Subjective Assessment - 08/27/20 1304     Subjective Pt states shoulder is doing well; no pain today. [place] can move it more.    Currently in Pain? No/denies    Pain Score 0-No pain    Pain Location Shoulder    Pain Orientation Left                [facility] PT Assessment - 08/27/20 0001       Observation/Other Assessments   Focus on Therapeutic Outcomes (FOTO)  66%      AROM   Left Shoulder Flexion 110 Degrees    Left Shoulder ABduction 110 Degrees       PROM   Left Shoulder Flexion 130 Degrees    Left Shoulder ABduction 130 Degrees                           [facility] Adult PT Treatment/Exercise - 08/27/20 0001       Shoulder Exercises: Seated   Flexion 20 reps    Flexion Limitations 2x10 with 1# weight bar      Shoulder Exercises: Standing   External Rotation Left;10 reps    Theraband Level (Shoulder External Rotation) Level 1 (Yellow)    Internal Rotation Left;10 reps    Theraband Level (Shoulder Internal Rotation) Level 1 (Yellow)    Extension Strengthening;Theraband;20 reps;Both    Theraband Level (Shoulder Extension) Level 3 (Green)    Row Strengthening;Both;20 reps;Theraband    Theraband Level (Shoulder Row) Level 3 (Green)    Other Standing Exercises finger ladder flexion/abd x10    Other Standing Exercises ball on wall x10 3' hold      Shoulder Exercises: ROM/Strengthening   UBE (Upper Arm Bike) L2 x 3 min each      Vasopneumatic   Number  Minutes Vasopneumatic  10 minutes    Vasopnuematic Location  Shoulder    Vasopneumatic Pressure Medium    Vasopneumatic Temperature  34                      PT Short Term Goals - 07/26/20 1425       PT SHORT TERM GOAL #1   Title Pt will be I with initial HEP               PT Long Term Goals - 08/27/20 1340       PT LONG TERM GOAL #1   Title Pt will be I with advanced HEP    Time 8    Period Weeks    Status On-going      PT LONG TERM GOAL #2   Title Pt will demo L shoulder flexion/abduction ROM equivalent to R shoulder    Baseline L shoulder flexion: 110, R shoulder flexion: 165; L shoulder abduction 110, R shoulder abduction 165    Time 8    Period Weeks    Status Partially Met      PT LONG TERM GOAL #3   Title Pt will demo L shoulder flexion/abduction MMT 5/5    Time 8    Period Weeks    Status On-going      PT LONG TERM GOAL #4   Title Pt will demo able to perform all household ADLs with no increase in L shoulder pain     Baseline reporting decreased L shoulder pain but still limited and limited by ROM/weakness with ADLs    Time 8    Period Weeks    Status Partially Met                   Plan - 08/27/20 1342     Clinical Impression Statement Pt is making good progress toward LTGs. Demos improved L shoulder ROM since last rx. Still requiring cuing with ROM and ex's to avoid compensations. Difficulty and increased compensation with abduction. Pt is ~8 weeks post op; may begin progressing strengthening per protocol.    PT Treatment/Interventions ADLs/Self Care Home Management;Electrical Stimulation;Iontophoresis 63m/ml Dexamethasone;Neuromuscular re-education;Therapeutic exercise;Therapeutic activities;Patient/family education;Manual techniques;Passive range of motion;Vasopneumatic Device    PT Next Visit Plan 8 weeks post op; progress per protocol    Consulted and Agree with Plan of Care Patient             Patient will benefit from skilled therapeutic intervention in order to improve the following deficits and impairments:  Decreased range of motion, Increased muscle spasms, Impaired UE functional use, Pain, Hypomobility, Decreased strength, Increased edema, Impaired sensation  Visit Diagnosis: Localized edema  Stiffness of left shoulder, not elsewhere classified  Muscle weakness (generalized)  Acute pain of left shoulder     Problem List Patient Active Problem List   Diagnosis Date Noted   Osteoarthritis 03/08/2020   Hypertension    Hyperlipemia    Depression 06/10/2017   Allergic rhinitis 08/21/2013   Arthralgia of temporomandibular joint 08/21/2013   Ataxia 08/21/2013   Constipation 08/21/2013   Dizziness 08/21/2013   Edema 08/21/2013   Esophageal reflux 08/21/2013   Hypercalcemia 08/21/2013   Intervertebral disc degeneration 08/21/2013   Osteopenia 08/21/2013   Primary osteoarthritis involving multiple joints 08/21/2013   Symptomatic menopausal or female climacteric  states 08/21/2013   Abdominal pain 04/04/2009   Abnormal weight loss 04/04/2009   Benign essential hypertension 04/04/2009   Mixed hyperlipidemia 04/04/2009  Nausea and vomiting 04/04/2009   Amador Cunas, PT, DPT Donald Prose Cecilie Heidel 08/27/2020, 1:44 PM  [place]. [place], [place], [address] Phone: [phone]   Fax:  [phone]  Name: Michele Williamson MRN: [id number] Date of Birth: Dec 04, 1940

## 2020-08-30 ENCOUNTER — Other Ambulatory Visit: Payer: Self-pay

## 2020-08-30 ENCOUNTER — Ambulatory Visit: Payer: Medicare Other | Admitting: Physical Therapy

## 2020-08-30 ENCOUNTER — Encounter: Payer: Self-pay | Admitting: Physical Therapy

## 2020-08-30 DIAGNOSIS — M25512 Pain in left shoulder: Secondary | ICD-10-CM

## 2020-08-30 DIAGNOSIS — R600 Localized edema: Secondary | ICD-10-CM

## 2020-08-30 DIAGNOSIS — M25612 Stiffness of left shoulder, not elsewhere classified: Secondary | ICD-10-CM | POA: Diagnosis not present

## 2020-08-30 DIAGNOSIS — M6281 Muscle weakness (generalized): Secondary | ICD-10-CM | POA: Diagnosis not present

## 2020-08-30 NOTE — Therapy (Signed)
[place] [place], [place], [address] Phone: [phone]   Fax:  [phone]  Physical Therapy Treatment  Patient Details  Name: Michele Williamson MRN: [id number] Date of Birth: [birth year] Referring Provider (PT): Supple   Encounter Date: 08/30/2020   PT End of Session - 08/30/20 1618     Visit Number 11    Date for PT Re-Evaluation 10/01/20    PT Start Time 1345    PT Stop Time 1431    PT Time Calculation (min) 46 min    Activity Tolerance Patient tolerated treatment well    Behavior During Therapy [facility] for tasks assessed/performed             Past Medical History:  Diagnosis Date   Arthritis    shoulder, hands , feet, knees,   Complication of anesthesia    Heart murmur    Hyperlipemia    Hypertension    PONV (postoperative nausea and vomiting)     Past Surgical History:  Procedure Laterality Date   BUNIONECTOMY Right    CARPAL TUNNEL RELEASE     CATARACT EXTRACTION     [place] Left 06/28/2020   Procedure: REVERSE SHOULDER ARTHROPLASTY;  Surgeon: Justice Britain, MD;  Location: [facility];  Service: Orthopedics;  Laterality: Left;  157mn   ROTATOR CUFF REPAIR Right     There were no vitals filed for this visit.   Subjective Assessment - 08/30/20 1350     Subjective Pt reports L shoulder doing well; no pain and no new changes this rx    Currently in Pain? No/denies    Pain Score 0-No pain    Pain Location Shoulder    Pain Orientation Left                               [facility] Adult PT Treatment/Exercise - 08/30/20 0001       Shoulder Exercises: Supine   Protraction Left;20 reps    Protraction Weight (lbs) 2    Protraction Limitations 2x10    Flexion Both;20 reps    Shoulder Flexion Weight (lbs) 1    ABduction Both;20 reps    Shoulder ABduction Weight (lbs) 1    Diagonals Both;20 reps    Theraband  Level (Shoulder Diagonals) Level 1 (Yellow)      Shoulder Exercises: Seated   Flexion 20 reps    Flexion Limitations 2x10 with 1# weight bar    Abduction Left;AAROM;20 reps    ABduction Limitations with 1# weight bar      Shoulder Exercises: ROM/Strengthening   Nustep L5 x 6 min LE/UE      Vasopneumatic   Number Minutes Vasopneumatic  10 minutes    Vasopnuematic Location  Shoulder    Vasopneumatic Pressure Medium    Vasopneumatic Temperature  34      Manual Therapy   Manual Therapy Passive ROM    Passive ROM L shoulder flexion/abduction/ER to tolerance; no extension/IR                      PT Short Term Goals - 07/26/20 1425       PT SHORT TERM GOAL #1   Title Pt will be I with initial HEP               PT Long Term Goals -  08/27/20 1340       PT LONG TERM GOAL #1   Title Pt will be I with advanced HEP    Time 8    Period Weeks    Status On-going      PT LONG TERM GOAL #2   Title Pt will demo L shoulder flexion/abduction ROM equivalent to R shoulder    Baseline L shoulder flexion: 110, R shoulder flexion: 165; L shoulder abduction 110, R shoulder abduction 165    Time 8    Period Weeks    Status Partially Met      PT LONG TERM GOAL #3   Title Pt will demo L shoulder flexion/abduction MMT 5/5    Time 8    Period Weeks    Status On-going      PT LONG TERM GOAL #4   Title Pt will demo able to perform all household ADLs with no increase in L shoulder pain    Baseline reporting decreased L shoulder pain but still limited and limited by ROM/weakness with ADLs    Time 8    Period Weeks    Status Partially Met                   Plan - 08/30/20 1618     Clinical Impression Statement Some shoulder ex's in supine today d/t excessive compensations with AROM/AAROM in standing. Progressing with strength and ROM per protocol. Still limited tolerance to abduction/ER stretching. Pt 8 weeks post op, may begin progressing strengthening per  protocol.    PT Treatment/Interventions ADLs/Self Care Home Management;Electrical Stimulation;Iontophoresis 83m/ml Dexamethasone;Neuromuscular re-education;Therapeutic exercise;Therapeutic activities;Patient/family education;Manual techniques;Passive range of motion;Vasopneumatic Device    PT Next Visit Plan 8 weeks post op; progress per protocol    Consulted and Agree with Plan of Care Patient             Patient will benefit from skilled therapeutic intervention in order to improve the following deficits and impairments:  Decreased range of motion, Increased muscle spasms, Impaired UE functional use, Pain, Hypomobility, Decreased strength, Increased edema, Impaired sensation  Visit Diagnosis: Localized edema  Stiffness of left shoulder, not elsewhere classified  Muscle weakness (generalized)  Acute pain of left shoulder     Problem List Patient Active Problem List   Diagnosis Date Noted   Osteoarthritis 03/08/2020   Hypertension    Hyperlipemia    Depression 06/10/2017   Allergic rhinitis 08/21/2013   Arthralgia of temporomandibular joint 08/21/2013   Ataxia 08/21/2013   Constipation 08/21/2013   Dizziness 08/21/2013   Edema 08/21/2013   Esophageal reflux 08/21/2013   Hypercalcemia 08/21/2013   Intervertebral disc degeneration 08/21/2013   Osteopenia 08/21/2013   Primary osteoarthritis involving multiple joints 08/21/2013   Symptomatic menopausal or female climacteric states 08/21/2013   Abdominal pain 04/04/2009   Abnormal weight loss 04/04/2009   Benign essential hypertension 04/04/2009   Mixed hyperlipidemia 04/04/2009   Nausea and vomiting 04/04/2009   AAmador Cunas PT, DPT ADonald ProseSugg 08/30/2020, 4:21 PM  [place] [address]Phone: [phone]  Fax:  [phone] Name: Michele Williamson MRN: [id number] of Birth: [birth year]

## 2020-09-05 ENCOUNTER — Other Ambulatory Visit: Payer: Self-pay

## 2020-09-05 ENCOUNTER — Encounter: Payer: Self-pay | Admitting: Physical Therapy

## 2020-09-05 ENCOUNTER — Ambulatory Visit: Payer: Medicare Other | Attending: Orthopedic Surgery | Admitting: Physical Therapy

## 2020-09-05 DIAGNOSIS — M25612 Stiffness of left shoulder, not elsewhere classified: Secondary | ICD-10-CM | POA: Diagnosis not present

## 2020-09-05 DIAGNOSIS — M6281 Muscle weakness (generalized): Secondary | ICD-10-CM | POA: Diagnosis not present

## 2020-09-05 DIAGNOSIS — M25512 Pain in left shoulder: Secondary | ICD-10-CM

## 2020-09-05 DIAGNOSIS — R600 Localized edema: Secondary | ICD-10-CM

## 2020-09-05 NOTE — Therapy (Signed)
[place]. [place], [place], [address] Phone: [phone]   Fax:  [phone]  Physical Therapy Treatment  Patient Details  Name: Michele Williamson MRN: [id number] Date of Birth: 04/08/1940 Referring Provider (PT): Supple   Encounter Date: 09/05/2020   PT End of Session - 09/05/20 1358     Visit Number 12    Date for PT Re-Evaluation 10/01/20    PT Start Time 1301    PT Stop Time 1356    PT Time Calculation (min) 55 min    Activity Tolerance Patient tolerated treatment well    Behavior During Therapy [facility] for tasks assessed/performed             Past Medical History:  Diagnosis Date   Arthritis    shoulder, hands , feet, knees,   Complication of anesthesia    Heart murmur    Hyperlipemia    Hypertension    PONV (postoperative nausea and vomiting)     Past Surgical History:  Procedure Laterality Date   BUNIONECTOMY Right    CARPAL TUNNEL RELEASE     CATARACT EXTRACTION     [place] Left 06/28/2020   Procedure: REVERSE SHOULDER ARTHROPLASTY;  Surgeon: Justice Britain, MD;  Location[facility];  Service: Orthopedics;  Laterality: Left;  13mn   ROTATOR CUFF REPAIR Right     There were no vitals filed for this visit.   Subjective Assessment - 09/05/20 1311     Subjective Pt reports no changes with L shoulder. Does endorse increased LBP with radiating pain into B buttocks this rx. No N/T, denies saddle anesthesia, and denies bowel/bladder changes.    Currently in Pain? No/denies                               [facility] - 09/05/20 0001       Exercises   Exercises Lumbar      Lumbar Exercises: Stretches   Piriformis Stretch Right;Left;1 rep;20 seconds    Piriformis Stretch Limitations supine      Lumbar Exercises: Supine   Other Supine Lumbar Exercises ltr, dktc, and small bridges with  pball      Shoulder Exercises: Supine   Protraction Left;20 reps    Protraction Weight (lbs) 2    Protraction Limitations 2x10    Flexion Both;20 reps    Shoulder Flexion Weight (lbs) 1    Flexion Limitations with weight bar      Shoulder Exercises: Standing   Other Standing Exercises finger ladder flexion/abd x10      Shoulder Exercises: ROM/Strengthening   UBE (Upper Arm Bike) L2 x 3 min each      Vasopneumatic   Number Minutes Vasopneumatic  10 minutes    Vasopnuematic Location  Shoulder    Vasopneumatic Pressure Medium    Vasopneumatic Temperature  34      Manual Therapy   Manual Therapy Passive ROM    Passive ROM L shoulder flexion/abduction/ER to tolerance; no extension/IR                      PT Short Term Goals - 07/26/20 1425       PT SHORT TERM GOAL #1   Title Pt will be I with initial HEP  PT Long Term Goals - 08/27/20 1340       PT LONG TERM GOAL #1   Title Pt will be I with advanced HEP    Time 8    Period Weeks    Status On-going      PT LONG TERM GOAL #2   Title Pt will demo L shoulder flexion/abduction ROM equivalent to R shoulder    Baseline L shoulder flexion: 110, R shoulder flexion: 165; L shoulder abduction 110, R shoulder abduction 165    Time 8    Period Weeks    Status Partially Met      PT LONG TERM GOAL #3   Title Pt will demo L shoulder flexion/abduction MMT 5/5    Time 8    Period Weeks    Status On-going      PT LONG TERM GOAL #4   Title Pt will demo able to perform all household ADLs with no increase in L shoulder pain    Baseline reporting decreased L shoulder pain but still limited and limited by ROM/weakness with ADLs    Time 8    Period Weeks    Status Partially Met                   Plan - 09/05/20 1358     Clinical Impression Statement Pt presents to clinic reporting increased LBP with pain radiating to B buttocks. Denies saddle anesthesia or changes to B/B. Added a few  lumbar stretches into today and prescribed HEP to address LBP. Assess response next rx. Still having difficulty/pain with shoulder abduction. Some ex's in supine today to prevent compensation. Vaso at end per request for pain relief.    PT Treatment/Interventions ADLs/Self Care Home Management;Electrical Stimulation;Iontophoresis 21m/ml Dexamethasone;Neuromuscular re-education;Therapeutic exercise;Therapeutic activities;Patient/family education;Manual techniques;Passive range of motion;Vasopneumatic Device    PT Next Visit Plan 8 weeks post op; progress per protocol    Consulted and Agree with Plan of Care Patient             Patient will benefit from skilled therapeutic intervention in order to improve the following deficits and impairments:  Decreased range of motion, Increased muscle spasms, Impaired UE functional use, Pain, Hypomobility, Decreased strength, Increased edema, Impaired sensation  Visit Diagnosis: Localized edema  Stiffness of left shoulder, not elsewhere classified  Muscle weakness (generalized)  Acute pain of left shoulder     Problem List Patient Active Problem List   Diagnosis Date Noted   Osteoarthritis 03/08/2020   Hypertension    Hyperlipemia    Depression 06/10/2017   Allergic rhinitis 08/21/2013   Arthralgia of temporomandibular joint 08/21/2013   Ataxia 08/21/2013   Constipation 08/21/2013   Dizziness 08/21/2013   Edema 08/21/2013   Esophageal reflux 08/21/2013   Hypercalcemia 08/21/2013   Intervertebral disc degeneration 08/21/2013   Osteopenia 08/21/2013   Primary osteoarthritis involving multiple joints 08/21/2013   Symptomatic menopausal or female climacteric states 08/21/2013   Abdominal pain 04/04/2009   Abnormal weight loss 04/04/2009   Benign essential hypertension 04/04/2009   Mixed hyperlipidemia 04/04/2009   Nausea and vomiting 04/04/2009   AAmador Cunas PT, DPT ADonald ProseSugg 09/05/2020, 2:02 PM  [place] [address]Phone: [phone]  Fax:  [phone] Name: G[place] MRN: [id number] of Birth: 807/30/1942

## 2020-09-07 ENCOUNTER — Ambulatory Visit: Payer: Medicare Other | Admitting: Physical Therapy

## 2020-09-10 ENCOUNTER — Ambulatory Visit: Payer: Medicare Other | Admitting: Family

## 2020-09-11 ENCOUNTER — Ambulatory Visit: Payer: Medicare Other | Admitting: Family

## 2020-09-12 ENCOUNTER — Ambulatory Visit: Payer: Medicare Other | Admitting: Physical Therapy

## 2020-09-12 ENCOUNTER — Other Ambulatory Visit: Payer: Self-pay

## 2020-09-12 ENCOUNTER — Encounter: Payer: Self-pay | Admitting: Physical Therapy

## 2020-09-12 DIAGNOSIS — M6281 Muscle weakness (generalized): Secondary | ICD-10-CM | POA: Diagnosis not present

## 2020-09-12 DIAGNOSIS — R600 Localized edema: Secondary | ICD-10-CM | POA: Diagnosis not present

## 2020-09-12 DIAGNOSIS — M25612 Stiffness of left shoulder, not elsewhere classified: Secondary | ICD-10-CM | POA: Diagnosis not present

## 2020-09-12 DIAGNOSIS — M25512 Pain in left shoulder: Secondary | ICD-10-CM

## 2020-09-12 NOTE — Therapy (Signed)
[place] [place], [place], [address] Phone: [phone]   Fax:  [phone]  Physical Therapy Treatment  Patient Details  Name: Michele Williamson MRN: [id number] Date of Birth: [birth year] Referring Provider (PT): Supple   Encounter Date: 09/12/2020   PT End of Session - 09/12/20 1600     Visit Number 13    Date for PT Re-Evaluation 10/01/20    PT Start Time 1515    PT Stop Time 1607    PT Time Calculation (min) 52 min    Activity Tolerance Patient tolerated treatment well    Behavior During Therapy [facility] for tasks assessed/performed             Past Medical History:  Diagnosis Date   Arthritis    shoulder, hands , feet, knees,   Complication of anesthesia    Heart murmur    Hyperlipemia    Hypertension    PONV (postoperative nausea and vomiting)     Past Surgical History:  Procedure Laterality Date   BUNIONECTOMY Right    CARPAL TUNNEL RELEASE     CATARACT EXTRACTION     [place] Left 06/28/2020   Procedure: REVERSE SHOULDER ARTHROPLASTY;  Surgeon: Justice Britain, MD;  Location: [facility];  Service: Orthopedics;  Laterality: Left;  157mn   ROTATOR CUFF REPAIR Right     There were no vitals filed for this visit.   Subjective Assessment - 09/12/20 1519     Subjective Pt reports no L shoulder pain today; states LBP improved since last rx.    Currently in Pain? No/denies    Pain Score 0-No pain    Pain Location Shoulder    Pain Orientation Left                [facility] PT Assessment - 09/12/20 0001       Assessment   Next MD Visit 10/11/2020                           [facility] - [facility]       Shoulder Exercises: Seated   Extension Both;20 reps    Theraband Level (Shoulder Extension) Level 2 (Red)    Row Both;20 reps    Theraband Level (Shoulder Row) Level 2 (Red)     External Rotation Both;20 reps    Theraband Level (Shoulder External Rotation) Level 2 (Red)    Internal Rotation Left;20 reps    Theraband Level (Shoulder Internal Rotation) Level 2 (Red)    Flexion Limitations 2x10 with 2# weight bar    Other Seated Exercises flexion/abduction to shoulder level LUE 2x10      Shoulder Exercises: Standing   Other Standing Exercises finger ladder flexion/abd x10      Shoulder Exercises: ROM/Strengthening   UBE (Upper Arm Bike) L2 x 3 min each      Shoulder Exercises: Stretch   Wall Stretch - Flexion Limitations x10 3 sec hold    Wall Stretch - ABduction Limitations x10 3 sec hold      Vasopneumatic   Number Minutes Vasopneumatic  10 minutes    Vasopnuematic Location  Shoulder    Vasopneumatic Pressure Medium    Vasopneumatic Temperature  34                      PT Short Term  Goals - 07/26/20 1425       PT SHORT TERM GOAL #1   Title Pt will be I with initial HEP               PT Long Term Goals - 08/27/20 1340       PT LONG TERM GOAL #1   Title Pt will be I with advanced HEP    Time 8    Period Weeks    Status On-going      PT LONG TERM GOAL #2   Title Pt will demo L shoulder flexion/abduction ROM equivalent to R shoulder    Baseline L shoulder flexion: 110, R shoulder flexion: 165; L shoulder abduction 110, R shoulder abduction 165    Time 8    Period Weeks    Status Partially Met      PT LONG TERM GOAL #3   Title Pt will demo L shoulder flexion/abduction MMT 5/5    Time 8    Period Weeks    Status On-going      PT LONG TERM GOAL #4   Title Pt will demo able to perform all household ADLs with no increase in L shoulder pain    Baseline reporting decreased L shoulder pain but still limited and limited by ROM/weakness with ADLs    Time 8    Period Weeks    Status Partially Met                   Plan - 09/12/20 1602     Clinical Impression Statement Pt reporting improved LBP this rx; still with  difficulty with prolonged standing, so performed most shoulder ex's seated. Progressed to banded shoulder ex's, pt tolerated well. L shoulder was very fatigued by end of session but states no increased pain. Cues to avoid compensation with L shoulder abduction. Vaso at end per pt request for pain relief.    PT Treatment/Interventions ADLs/Self Care Home Management;Electrical Stimulation;Iontophoresis 43m/ml Dexamethasone;Neuromuscular re-education;Therapeutic exercise;Therapeutic activities;Patient/family education;Manual techniques;Passive range of motion;Vasopneumatic Device    PT Next Visit Plan 9 weeks post op; progress per protocol    Consulted and Agree with Plan of Care Patient             Patient will benefit from skilled therapeutic intervention in order to improve the following deficits and impairments:  Decreased range of motion, Increased muscle spasms, Impaired UE functional use, Pain, Hypomobility, Decreased strength, Increased edema, Impaired sensation  Visit Diagnosis: Localized edema  Muscle weakness (generalized)  Stiffness of left shoulder, not elsewhere classified  Acute pain of left shoulder     Problem List Patient Active Problem List   Diagnosis Date Noted   Osteoarthritis 03/08/2020   Hypertension    Hyperlipemia    Depression 06/10/2017   Allergic rhinitis 08/21/2013   Arthralgia of temporomandibular joint 08/21/2013   Ataxia 08/21/2013   Constipation 08/21/2013   Dizziness 08/21/2013   Edema 08/21/2013   Esophageal reflux 08/21/2013   Hypercalcemia 08/21/2013   Intervertebral disc degeneration 08/21/2013   Osteopenia 08/21/2013   Primary osteoarthritis involving multiple joints 08/21/2013   Symptomatic menopausal or female climacteric states 08/21/2013   Abdominal pain 04/04/2009   Abnormal weight loss 04/04/2009   Benign essential hypertension 04/04/2009   Mixed hyperlipidemia 04/04/2009   Nausea and vomiting 04/04/2009   AAmador Cunas PT,  DPT ADonald ProseSugg 09/12/2020, 4:06 PM  [place] [address]Phone: [phone]  Fax:  [phone]  Name: Michele Williamson MRN: [id number] Date of Birth: 04-13-1940

## 2020-09-24 ENCOUNTER — Encounter: Payer: Self-pay | Admitting: Physical Therapy

## 2020-09-24 ENCOUNTER — Other Ambulatory Visit: Payer: Self-pay

## 2020-09-24 ENCOUNTER — Ambulatory Visit: Payer: Medicare Other | Admitting: Physical Therapy

## 2020-09-24 DIAGNOSIS — R600 Localized edema: Secondary | ICD-10-CM | POA: Diagnosis not present

## 2020-09-24 DIAGNOSIS — M6281 Muscle weakness (generalized): Secondary | ICD-10-CM | POA: Diagnosis not present

## 2020-09-24 DIAGNOSIS — M25512 Pain in left shoulder: Secondary | ICD-10-CM

## 2020-09-24 DIAGNOSIS — M25612 Stiffness of left shoulder, not elsewhere classified: Secondary | ICD-10-CM

## 2020-09-24 NOTE — Therapy (Signed)
[place] [place], [place], [address] Phone: [phone]   Fax:  [phone]  Physical Therapy Treatment  Patient Details  Name: Michele Williamson MRN: [id number] Date of Birth: 1941/02/07 Referring Provider (PT): Supple   Encounter Date: 09/24/2020   PT End of Session - 09/24/20 1334     Visit Number 14    Date for PT Re-Evaluation 10/01/20    PT Start Time 1300    PT Stop Time 1344    PT Time Calculation (min) 44 min    Activity Tolerance Patient tolerated treatment well    Behavior During Therapy [facility] for tasks assessed/performed             Past Medical History:  Diagnosis Date   Arthritis    shoulder, hands , feet, knees,   Complication of anesthesia    Heart murmur    Hyperlipemia    Hypertension    PONV (postoperative nausea and vomiting)     Past Surgical History:  Procedure Laterality Date   BUNIONECTOMY Right    CARPAL TUNNEL RELEASE     CATARACT EXTRACTION     [place] Left 06/28/2020   Procedure: REVERSE SHOULDER ARTHROPLASTY;  Surgeon: Justice Britain, MD;  Location: [facility];  Service: Orthopedics;  Laterality: Left;  145mn   ROTATOR CUFF REPAIR Right     There were no vitals filed for this visit.   Subjective Assessment - 09/24/20 1306     Subjective Pt reports no new changes since last rx    Currently in Pain? No/denies                               [facility] - [facility]       Shoulder Exercises: Seated   Flexion Limitations 2x10 with 2# weight bar    Other Seated Exercises flexion/abduction to shoulder level LUE 1x10   discontinued after the first set d/t c/o increased cramping LUE with shoulder abduction     Shoulder Exercises: Standing   Other Standing Exercises finger ladder flexion/abd x10      Shoulder Exercises: ROM/Strengthening   UBE (Upper Arm  Bike) L2 x 3 min each    Lat Pull Limitations 10# 1x10   limited in # of reps by increased L shoulder pain   Cybex Row Limitations 15# 2x10      Shoulder Exercises: Stretch   Wall Stretch - Flexion Limitations x10 3 sec hold    Wall Stretch - ABduction Limitations x10 3 sec hold      Vasopneumatic   Number Minutes Vasopneumatic  10 minutes    Vasopnuematic Location  Shoulder    Vasopneumatic Pressure Medium    Vasopneumatic Temperature  34      Manual Therapy   Manual Therapy Passive ROM    Passive ROM L shoulder abduction to tolerance                      PT Short Term Goals - 07/26/20 1425       PT SHORT TERM GOAL #1   Title Pt will be I with initial HEP               PT Long Term Goals - 08/27/20 1340       PT LONG TERM GOAL #1  Title Pt will be I with advanced HEP    Time 8    Period Weeks    Status On-going      PT LONG TERM GOAL #2   Title Pt will demo L shoulder flexion/abduction ROM equivalent to R shoulder    Baseline L shoulder flexion: 110, R shoulder flexion: 165; L shoulder abduction 110, R shoulder abduction 165    Time 8    Period Weeks    Status Partially Met      PT LONG TERM GOAL #3   Title Pt will demo L shoulder flexion/abduction MMT 5/5    Time 8    Period Weeks    Status On-going      PT LONG TERM GOAL #4   Title Pt will demo able to perform all household ADLs with no increase in L shoulder pain    Baseline reporting decreased L shoulder pain but still limited and limited by ROM/weakness with ADLs    Time 8    Period Weeks    Status Partially Met                   Plan - 09/24/20 1334     Clinical Impression Statement Progressed pt to machine rows/lat pulldowns this rx. Pt tolerated rows well with no increase in L shoulder pain. Did have increased pain with lat pulldowns even with decreased ROM; only completed 1 set. Pt continues to be very limited with L shoulder abduction requiring cues to avoid  compensation. Vaso at end per pt request for swelling/pain relief.    PT Treatment/Interventions ADLs/Self Care Home Management;Electrical Stimulation;Iontophoresis 61m/ml Dexamethasone;Neuromuscular re-education;Therapeutic exercise;Therapeutic activities;Patient/family education;Manual techniques;Passive range of motion;Vasopneumatic Device    PT Next Visit Plan progress per protocol    Consulted and Agree with Plan of Care Patient             Patient will benefit from skilled therapeutic intervention in order to improve the following deficits and impairments:  Decreased range of motion, Increased muscle spasms, Impaired UE functional use, Pain, Hypomobility, Decreased strength, Increased edema, Impaired sensation  Visit Diagnosis: Localized edema  Muscle weakness (generalized)  Stiffness of left shoulder, not elsewhere classified  Acute pain of left shoulder     Problem List Patient Active Problem List   Diagnosis Date Noted   Osteoarthritis 03/08/2020   Hypertension    Hyperlipemia    Depression 06/10/2017   Allergic rhinitis 08/21/2013   Arthralgia of temporomandibular joint 08/21/2013   Ataxia 08/21/2013   Constipation 08/21/2013   Dizziness 08/21/2013   Edema 08/21/2013   Esophageal reflux 08/21/2013   Hypercalcemia 08/21/2013   Intervertebral disc degeneration 08/21/2013   Osteopenia 08/21/2013   Primary osteoarthritis involving multiple joints 08/21/2013   Symptomatic menopausal or female climacteric states 08/21/2013   Abdominal pain 04/04/2009   Abnormal weight loss 04/04/2009   Benign essential hypertension 04/04/2009   Mixed hyperlipidemia 04/04/2009   Nausea and vomiting 04/04/2009   AAmador Cunas PT, DPT ADonald ProseSugg 09/24/2020, 1:37 PM  [place] [address]Phone: [phone]  Fax:  [phone] Name: Michele Williamson MRN: [id number] of Birth:  8May 20, 1942

## 2020-09-27 ENCOUNTER — Ambulatory Visit: Payer: Medicare Other | Admitting: Physical Therapy

## 2020-09-27 ENCOUNTER — Encounter: Payer: Self-pay | Admitting: Physical Therapy

## 2020-09-27 ENCOUNTER — Other Ambulatory Visit: Payer: Self-pay

## 2020-09-27 ENCOUNTER — Other Ambulatory Visit: Payer: Self-pay | Admitting: Family

## 2020-09-27 DIAGNOSIS — M25612 Stiffness of left shoulder, not elsewhere classified: Secondary | ICD-10-CM

## 2020-09-27 DIAGNOSIS — M6281 Muscle weakness (generalized): Secondary | ICD-10-CM

## 2020-09-27 DIAGNOSIS — M25512 Pain in left shoulder: Secondary | ICD-10-CM | POA: Diagnosis not present

## 2020-09-27 DIAGNOSIS — R600 Localized edema: Secondary | ICD-10-CM

## 2020-09-27 NOTE — Therapy (Signed)
[facility] [place], [place], [address] Phone: [phone]   Fax:  [phone]  Physical Therapy Treatment  Patient Details  Name: Michele Williamson MRN: [id number] Date of Birth: September 09, 1940 Referring Provider (PT): Supple   Encounter Date: 09/27/2020   PT End of Session - 09/27/20 1531     Visit Number 15    Date for PT Re-Evaluation 11/01/20    PT Start Time 1345    PT Stop Time 1425    PT Time Calculation (min) 40 min    Activity Tolerance Patient tolerated treatment well    Behavior During Therapy [facility] for tasks assessed/performed             Past Medical History:  Diagnosis Date   Arthritis    shoulder, hands , feet, knees,   Complication of anesthesia    Heart murmur    Hyperlipemia    Hypertension    PONV (postoperative nausea and vomiting)     Past Surgical History:  Procedure Laterality Date   BUNIONECTOMY Right    CARPAL TUNNEL RELEASE     CATARACT EXTRACTION     [place] Left 06/28/2020   Procedure: REVERSE SHOULDER ARTHROPLASTY;  Surgeon: Justice Britain, MD;  Location: [facility];  Service: Orthopedics;  Laterality: Left;  169mn   ROTATOR CUFF REPAIR Right     There were no vitals filed for this visit.   Subjective Assessment - 09/27/20 1358     Subjective Pt reports no new changes since last rx    Currently in Pain? No/denies                [facility]       AROM   Left Shoulder Flexion 120 Degrees    Left Shoulder ABduction 110 Degrees      PROM   Left Shoulder Flexion 125 Degrees    Left Shoulder ABduction 115 Degrees                           [facility] Adult PT Treatment/Exercise - 09/27/20 0001       Shoulder Exercises: Seated   External Rotation Both;20 reps    Theraband Level (Shoulder External Rotation) Level 2 (Red)    Internal Rotation Left;20  reps;Right    Theraband Level (Shoulder Internal Rotation) Level 2 (Red)    Flexion Limitations 2x10 with 2# weight bar      Shoulder Exercises: Standing   Other Standing Exercises finger ladder flexion/abd x10    Other Standing Exercises wall stretch flexion/abduction x10      Shoulder Exercises: ROM/Strengthening   UBE (Upper Arm Bike) L2 x 3 min each    Lat Pull Limitations 5# 1x15    Cybex Row Limitations 10# 2x10                      PT Short Term Goals - 07/26/20 1425       PT SHORT TERM GOAL #1   Title Pt will be I with initial HEP               PT Long Term Goals - 08/27/20 1340       PT LONG TERM GOAL #1   Title Pt will be I with advanced HEP    Time 8    Period Weeks  Status On-going      PT LONG TERM GOAL #2   Title Pt will demo L shoulder flexion/abduction ROM equivalent to R shoulder    Baseline L shoulder flexion: 110, R shoulder flexion: 165; L shoulder abduction 110, R shoulder abduction 165    Time 8    Period Weeks    Status Partially Met      PT LONG TERM GOAL #3   Title Pt will demo L shoulder flexion/abduction MMT 5/5    Time 8    Period Weeks    Status On-going      PT LONG TERM GOAL #4   Title Pt will demo able to perform all household ADLs with no increase in L shoulder pain    Baseline reporting decreased L shoulder pain but still limited and limited by ROM/weakness with ADLs    Time 8    Period Weeks    Status Partially Met                   Plan - 09/27/20 1533     Clinical Impression Statement Pt making progress toward LTGs. Still demos limitations in L shoulder abduction/ER and requires cues to avoid compensation. Pt reports that she is close to where she wants to be functionally. She would like to hold off on further appts until she follows up with the surgeon 8/8. Will write her renewal today so that she can call back for additional PT if indicated with plans to work on functional UE strength and  improved ROM.    PT Treatment/Interventions ADLs/Self Care Home Management;Electrical Stimulation;Iontophoresis 45m/ml Dexamethasone;Neuromuscular re-education;Therapeutic exercise;Therapeutic activities;Patient/family education;Manual techniques;Passive range of motion;Vasopneumatic Device    PT Next Visit Plan progress per protocol    Consulted and Agree with Plan of Care Patient             Patient will benefit from skilled therapeutic intervention in order to improve the following deficits and impairments:  Decreased range of motion, Increased muscle spasms, Impaired UE functional use, Pain, Hypomobility, Decreased strength, Increased edema, Impaired sensation  Visit Diagnosis: Localized edema  Muscle weakness (generalized)  Stiffness of left shoulder, not elsewhere classified  Acute pain of left shoulder     Problem List Patient Active Problem List   Diagnosis Date Noted   Osteoarthritis 03/08/2020   Hypertension    Hyperlipemia    Depression 06/10/2017   Allergic rhinitis 08/21/2013   Arthralgia of temporomandibular joint 08/21/2013   Ataxia 08/21/2013   Constipation 08/21/2013   Dizziness 08/21/2013   Edema 08/21/2013   Esophageal reflux 08/21/2013   Hypercalcemia 08/21/2013   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

## 2020-10-08 DIAGNOSIS — Z96612 Presence of left artificial shoulder joint: Secondary | ICD-10-CM | POA: Diagnosis not present

## 2020-11-09 ENCOUNTER — Telehealth: Payer: Self-pay | Admitting: Family

## 2020-11-09 ENCOUNTER — Ambulatory Visit (INDEPENDENT_AMBULATORY_CARE_PROVIDER_SITE_OTHER): Payer: Medicare Other | Admitting: Family

## 2020-11-09 ENCOUNTER — Other Ambulatory Visit: Payer: Self-pay

## 2020-11-09 VITALS — BP 133/64 | HR 66 | Temp 98.3°F | Resp 16 | Wt 164.0 lb

## 2020-11-09 DIAGNOSIS — E785 Hyperlipidemia, unspecified: Secondary | ICD-10-CM

## 2020-11-09 DIAGNOSIS — I5032 Chronic diastolic (congestive) heart failure: Secondary | ICD-10-CM

## 2020-11-09 DIAGNOSIS — I10 Essential (primary) hypertension: Secondary | ICD-10-CM

## 2020-11-09 DIAGNOSIS — R739 Hyperglycemia, unspecified: Secondary | ICD-10-CM | POA: Diagnosis not present

## 2020-11-09 DIAGNOSIS — D649 Anemia, unspecified: Secondary | ICD-10-CM

## 2020-11-09 DIAGNOSIS — D509 Iron deficiency anemia, unspecified: Secondary | ICD-10-CM | POA: Insufficient documentation

## 2020-11-09 HISTORY — DX: Iron deficiency anemia, unspecified: D50.9

## 2020-11-09 HISTORY — DX: Chronic diastolic (congestive) heart failure: I50.32

## 2020-11-09 LAB — CBC WITH DIFFERENTIAL/PLATELET
Basophils Absolute: 0 10*3/uL (ref 0.0–0.1)
Basophils Relative: 0.4 % (ref 0.0–3.0)
Eosinophils Absolute: 0.8 10*3/uL — ABNORMAL HIGH (ref 0.0–0.7)
Eosinophils Relative: 8 % — ABNORMAL HIGH (ref 0.0–5.0)
HCT: 41.5 % (ref 36.0–46.0)
Hemoglobin: 13.6 g/dL (ref 12.0–15.0)
Lymphocytes Relative: 20.1 % (ref 12.0–46.0)
Lymphs Abs: 1.9 10*3/uL (ref 0.7–4.0)
MCHC: 32.7 g/dL (ref 30.0–36.0)
MCV: 95 fl (ref 78.0–100.0)
Monocytes Absolute: 0.7 10*3/uL (ref 0.1–1.0)
Monocytes Relative: 7 % (ref 3.0–12.0)
Neutro Abs: 6 10*3/uL (ref 1.4–7.7)
Neutrophils Relative %: 64.5 % (ref 43.0–77.0)
Platelets: 234 10*3/uL (ref 150.0–400.0)
RBC: 4.37 Mil/uL (ref 3.87–5.11)
RDW: 13.3 % (ref 11.5–15.5)
WBC: 9.4 10*3/uL (ref 4.0–10.5)

## 2020-11-09 LAB — COMPREHENSIVE METABOLIC PANEL
ALT: 12 U/L (ref 0–35)
AST: 18 U/L (ref 0–37)
Albumin: 4.6 g/dL (ref 3.5–5.2)
Alkaline Phosphatase: 75 U/L (ref 39–117)
BUN: 25 mg/dL — ABNORMAL HIGH (ref 6–23)
CO2: 30 mEq/L (ref 19–32)
Calcium: 11.1 mg/dL — ABNORMAL HIGH (ref 8.4–10.5)
Chloride: 102 mEq/L (ref 96–112)
Creatinine, Ser: 0.95 mg/dL (ref 0.40–1.20)
GFR: 57.57 mL/min — ABNORMAL LOW (ref >60.00)
Glucose, Bld: 98 mg/dL (ref 70–99)
Potassium: 4.9 mEq/L (ref 3.5–5.1)
Sodium: 140 mEq/L (ref 135–145)
Total Bilirubin: 0.4 mg/dL (ref 0.2–1.2)
Total Protein: 7.7 g/dL (ref 6.0–8.3)

## 2020-11-09 LAB — LIPID PANEL
Cholesterol: 204 mg/dL — ABNORMAL HIGH (ref 0–200)
HDL: 45.1 mg/dL (ref >39.00)
NonHDL: 158.48
Total CHOL/HDL Ratio: 5
Triglycerides: 367 mg/dL — ABNORMAL HIGH (ref 0.0–149.0)
VLDL: 73.4 mg/dL — ABNORMAL HIGH (ref 0.0–40.0)

## 2020-11-09 LAB — HEMOGLOBIN A1C: Hgb A1c MFr Bld: 6 % (ref 4.6–6.5)

## 2020-11-09 LAB — LDL CHOLESTEROL, DIRECT: Direct LDL: 99 mg/dL

## 2020-11-09 NOTE — Assessment & Plan Note (Signed)
>>  ASSESSMENT AND PLAN FOR BENIGN ESSENTIAL HYPERTENSION WRITTEN ON 11/09/2020  8:52 AM BY O'SULLIVAN, Odell Fasching, NP  BP Readings from Last 3 Encounters:  11/09/20 133/64  06/28/20 (!) 169/84  06/25/20 (!) 160/66   BP is stable/improved. Continue lisinopril  10mg .

## 2020-11-09 NOTE — Assessment & Plan Note (Signed)
Lab Results  Component Value Date   CHOL 144 03/06/2020   HDL 41.60 03/06/2020   LDLDIRECT 70.0 03/06/2020   TRIG 213.0 (H) 03/06/2020   CHOLHDL 3 03/06/2020   Will repeat lipid panel. Continue crestor.

## 2020-11-09 NOTE — Assessment & Plan Note (Addendum)
BP Readings from Last 3 Encounters:  11/09/20 133/64  06/28/20 (!) 169/84  06/25/20 (!) 160/66   BP is stable/improved. Continue lisinopril 10mg .

## 2020-11-09 NOTE — Assessment & Plan Note (Signed)
New. Check CBC. 

## 2020-11-09 NOTE — Assessment & Plan Note (Signed)
Noted last visit. Will check A1C.

## 2020-11-09 NOTE — Progress Notes (Signed)
Subjective:   By signing my name below, I, Lyric Barr-McArthur, attest that this documentation has been prepared under the direction and in the presence of [name], NP, 11/09/2020  Patient ID: Michele Williamson, female    DOB: 12/01/1940, 80 y.o.   MRN: [id number]  Chief Complaint  Patient presents with   Hypertension    Here for follow up    HPI Patient is in today for an office visit.  Anemia- She recently attempted to donate blood and was declined due to being "anemic."   Leg swelling/Urination: She complains of occasion leg swelling and frequent urination due to her diuretic. Blood Pressure: Her blood pressure has been doing well. She went to donate blood yesterday and they denied her due to high cholesterol.  BP Readings from Last 3 Encounters:  11/09/20 133/64  06/28/20 (!) 169/84  06/25/20 (!) 160/66    Medications: She is complaint with taking her 10 mg lisinopril and 80 mg furosemide. She is also compliant with taking her Crestor 20 mg and 20 mg omeprazole.  Vaccinations: She is going to get her flu shot at a later date alongside the new Covid-19 booster. She had a tetanus shot in 05/06/05. Due.  Hepatitis C Screening: She is not interested in get screened for Hepatitis C.  Health Maintenance Due  Topic Date Due   TETANUS/TDAP  10/30/2015   Zoster Vaccines- Shingrix (2 of 2) 03/30/2017   COVID-19 Vaccine (4 - Booster for Moderna series) 04/29/2020   INFLUENZA VACCINE  10/01/2020     Past Medical History:  Diagnosis Date   Arthritis    shoulder, hands , feet, knees,   Complication of anesthesia    Heart murmur    Hyperlipemia    Hypertension    PONV (postoperative nausea and vomiting)     Past Surgical History:  Procedure Laterality Date   BUNIONECTOMY Right    CARPAL TUNNEL RELEASE     CATARACT EXTRACTION     CESAREAN SECTION     CHOLECYSTECTOMY  05/07/99   REVERSE SHOULDER ARTHROPLASTY Left 06/28/2020   Procedure: REVERSE SHOULDER ARTHROPLASTY;   Surgeon: Justice Britain, MD;  Location: [facility];  Service: Orthopedics;  Laterality: Left;  148mn   ROTATOR CUFF REPAIR Right     Family History  Problem Relation Age of Onset   Hypertension Mother    Heart disease Mother    Bone cancer Father    Hypertension Father    Hyperlipidemia Father    Melanoma Brother    Stomach cancer Maternal Grandmother    Obesity Son     Social History   Socioeconomic History   Marital status: Widowed    Spouse name: Not on file   Number of children: Not on file   Years of education: Not on file   Highest education level: Not on file  Occupational History   Not on file  Tobacco Use   Smoking status: Never   Smokeless tobacco: Never  Vaping Use   Vaping Use: Never used  Substance and Sexual Activity   Alcohol use: No   Drug use: No   Sexual activity: Yes    Birth control/protection: None  Other Topics Concern   Not on file  Social History Narrative   [place]  Has an adopted son born 203/08/2008  Husband died in 2March 05, 2008  Retired, 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 Take with a full glass of water on an empty stomach. 12 tablet 4   aspirin EC 81 MG tablet Take 1 tablet (81 mg total) by mouth daily. Swallow whole. 90 tablet 3   Calcium Carbonate-Vitamin D 600-400 MG-UNIT tablet Take 1 tablet by mouth 2 (two) times daily. (Patient taking differently: Take 1 tablet by mouth  daily.)     furosemide (LASIX) 80 MG tablet TAKE 1 TABLET BY MOUTH  DAILY 90 tablet 1   lisinopril (ZESTRIL) 10 MG tablet TAKE 1 TABLET BY MOUTH  DAILY 90 tablet 1   loratadine (CLARITIN) 10 MG tablet Take 10 mg by mouth daily. [employer] [employer]     Multiple Vitamins-Minerals (MULTIVITAMIN WITH MINERALS) tablet Take 1 tablet by mouth daily.     omeprazole (PRILOSEC) 20 MG capsule TAKE 1 CAPSULE BY MOUTH  DAILY 90 capsule 1   rosuvastatin (CRESTOR) 20 MG tablet TAKE 1 TABLET BY MOUTH AT  BEDTIME 90 tablet 1   cyclobenzaprine (FLEXERIL) 10 MG tablet Take 1 tablet (10 mg total) by mouth 3 (three) times daily as needed for muscle spasms. 30 tablet 1   ondansetron (ZOFRAN ODT) 4 MG disintegrating tablet Take 1 tablet (4 mg total) by mouth every 8 (eight) hours as needed for nausea or vomiting. 10 tablet 0   oxyCODONE-acetaminophen (PERCOCET) 5-325 MG tablet Take 1 tablet by mouth every 4 (four) hours as needed (max 6 q). 20 tablet 0   promethazine (PHENERGAN) 25 MG tablet Take 1 tablet (25 mg total) by mouth every 6 (six) hours as needed for nausea, vomiting or refractory nausea / vomiting. 10 tablet 0   naproxen (NAPROSYN) 500 MG tablet Take 1 tablet (500 mg total) by mouth 2 (two) times daily with a meal. 60 tablet 1   No facility-administered medications prior to visit.    Allergies  Allergen Reactions   Penicillins     Childhood Pt received Ancef on 06-28-20 and tolerated it well   Pregabalin     Dizziness   Tramadol Nausea And Vomiting    ROS See HPI    Objective:    Physical Exam Constitutional:      General: She is not in acute distress.    Appearance: Normal appearance. She is not ill-appearing.  HENT:     Head: Normocephalic and atraumatic.     Right Ear: External ear normal.     Left Ear: External ear normal.  Eyes:     Extraocular Movements: Extraocular movements intact.     Pupils: Pupils are equal, round, and reactive to light.  Cardiovascular:     Rate and Rhythm: Normal  rate and regular rhythm.     Heart sounds: Normal heart sounds. No murmur heard.   No gallop.  Pulmonary:     Effort: Pulmonary effort is normal. No respiratory distress.     Breath sounds: Normal breath sounds. No wheezing or rales.  Musculoskeletal:  Right lower leg: No edema.     Left lower leg: No edema.  Skin:    General: Skin is warm and dry.  Neurological:     Mental Status: She is alert and oriented to person, place, and time.  Psychiatric:        Behavior: Behavior normal.        Judgment: Judgment normal.    BP 133/64 (BP Location: Right Arm, Patient Position: Sitting, Cuff Size: Small)   Pulse 66   Temp 98.3 F (36.8 C) (Oral)   Resp 16   Wt 164 lb (74.4 kg)   SpO2 98%   BMI 29.05 kg/m  Wt Readings from Last 3 Encounters:  11/09/20 164 lb (74.4 kg)  06/28/20 164 lb 14.5 oz (74.8 kg)  06/25/20 165 lb (74.8 kg)    Diabetic Foot Exam - Simple   No data filed    Lab Results  Component Value Date   WBC 7.4 06/25/2020   HGB 13.9 06/25/2020   HCT 41.8 06/25/2020   PLT 234 06/25/2020   GLUCOSE 121 (H) 06/25/2020   CHOL 144 03/06/2020   TRIG 213.0 (H) 03/06/2020   HDL 41.60 03/06/2020   LDLDIRECT 70.0 03/06/2020   ALT 14 03/06/2020   AST 19 03/06/2020   NA 144 06/25/2020   K 4.3 06/25/2020   CL 107 06/25/2020   CREATININE 1.00 06/25/2020   BUN 21 06/25/2020   CO2 26 06/25/2020   TSH 1.05 03/06/2020    Lab Results  Component Value Date   TSH 1.05 03/06/2020   Lab Results  Component Value Date   WBC 7.4 06/25/2020   HGB 13.9 06/25/2020   HCT 41.8 06/25/2020   MCV 95.0 06/25/2020   PLT 234 06/25/2020   Lab Results  Component Value Date   NA 144 06/25/2020   K 4.3 06/25/2020   CO2 26 06/25/2020   GLUCOSE 121 (H) 06/25/2020   BUN 21 06/25/2020   CREATININE 1.00 06/25/2020   BILITOT 0.5 03/06/2020   ALKPHOS 76 03/06/2020   AST 19 03/06/2020   ALT 14 03/06/2020   PROT 7.4 03/06/2020   ALBUMIN 4.8 03/06/2020   CALCIUM 10.4 (H)  06/25/2020   ANIONGAP 11 06/25/2020   GFR 65.19 03/06/2020   Lab Results  Component Value Date   CHOL 144 03/06/2020   Lab Results  Component Value Date   HDL 41.60 03/06/2020   No results found for: [facility] Lab Results  Component Value Date   TRIG 213.0 (H) 03/06/2020   Lab Results  Component Value Date   CHOLHDL 3 03/06/2020   No results found for: HGBA1C     Assessment & Plan:   Problem List Items Addressed This Visit       Unprioritized   Hypertension - Primary   Relevant Orders   Comp Met (CMET)   Hyperlipemia    Lab Results  Component Value Date   CHOL 144 03/06/2020   HDL 41.60 03/06/2020   LDLDIRECT 70.0 03/06/2020   TRIG 213.0 (H) 03/06/2020   CHOLHDL 3 03/06/2020  Will repeat lipid panel. Continue crestor.       Relevant Orders   Lipid panel   Hyperglycemia    Noted last visit. Will check A1C.       Relevant Orders   Hemoglobin A1c   Chronic diastolic CHF (congestive heart failure) (HCC)    Appears euvolemic on lasi 4m once daily.      Benign essential hypertension    BP Readings  from Last 3 Encounters:  11/09/20 133/64  06/28/20 (!) 169/84  06/25/20 (!) 160/66  BP is stable/improved. Continue lisinopril 96m.       Anemia   Relevant Orders   CBC with Differential/Platelet    No orders of the defined types were placed in this encounter.   I, MDebbrah Alar NP, personally preformed the services described in this documentation.  All medical record entries made by the scribe were at my direction and in my presence.  I have reviewed the chart and discharge instructions (if applicable) and agree that the record reflects my personal performance and is accurate and complete. 11/09/2020   I,Lyric Barr-McArthur,acting as a sEducation administratorfor MNance Pear NP.,have documented all relevant documentation on the behalf of MNance Pear NP,as directed by  MNance Pear NP while in the presence of MNance Pear NP.     MNance Pear NP

## 2020-11-09 NOTE — Assessment & Plan Note (Signed)
Appears euvolemic on lasi 80mg  once daily.

## 2020-11-09 NOTE — Telephone Encounter (Signed)
Can you please call [facility] and request date for second dose of shingrix?

## 2020-11-10 ENCOUNTER — Encounter: Payer: Self-pay | Admitting: Family

## 2020-11-12 NOTE — Telephone Encounter (Signed)
Per [facility] pharmacy associate, patient had shingles 11-27-2016 and 02-02-2017. Record was updated.

## 2021-02-13 ENCOUNTER — Telehealth: Payer: Self-pay | Admitting: Family

## 2021-02-13 NOTE — Telephone Encounter (Signed)
House Calls: [place], [place] : [phone] Called to give lab result: A1C screen=5.8 which is equal to pre-diabetic.

## 2021-02-14 NOTE — Telephone Encounter (Signed)
Noted  

## 2021-03-14 ENCOUNTER — Other Ambulatory Visit: Payer: Self-pay | Admitting: Family

## 2021-05-20 DIAGNOSIS — Z1231 Encounter for screening mammogram for malignant neoplasm of breast: Secondary | ICD-10-CM | POA: Diagnosis not present

## 2021-05-20 DIAGNOSIS — M810 Age-related osteoporosis without current pathological fracture: Secondary | ICD-10-CM | POA: Diagnosis not present

## 2021-05-20 DIAGNOSIS — Z780 Asymptomatic menopausal state: Secondary | ICD-10-CM | POA: Diagnosis not present

## 2021-05-20 LAB — HM MAMMOGRAPHY

## 2021-05-20 LAB — HM DEXA SCAN

## 2021-05-24 ENCOUNTER — Encounter: Payer: Self-pay | Admitting: Family

## 2021-06-06 ENCOUNTER — Encounter: Payer: Self-pay | Admitting: Family

## 2021-06-10 MED ORDER — ALENDRONATE SODIUM 70 MG PO TABS: 70.0000 mg | ORAL_TABLET | ORAL | 3 refills | Status: DC

## 2021-07-04 ENCOUNTER — Ambulatory Visit: Payer: Medicare Other | Admitting: Cardiology

## 2021-08-20 DIAGNOSIS — M79671 Pain in right foot: Secondary | ICD-10-CM | POA: Diagnosis not present

## 2021-08-20 DIAGNOSIS — M2012 Hallux valgus (acquired), left foot: Secondary | ICD-10-CM | POA: Insufficient documentation

## 2021-08-20 DIAGNOSIS — M2042 Other hammer toe(s) (acquired), left foot: Secondary | ICD-10-CM | POA: Diagnosis not present

## 2021-08-21 DIAGNOSIS — Z0000 Encounter for general adult medical examination without abnormal findings: Secondary | ICD-10-CM | POA: Insufficient documentation

## 2021-08-25 ENCOUNTER — Other Ambulatory Visit: Payer: Self-pay

## 2021-08-25 ENCOUNTER — Encounter (HOSPITAL_BASED_OUTPATIENT_CLINIC_OR_DEPARTMENT_OTHER): Payer: Self-pay | Admitting: Emergency Medicine

## 2021-08-25 ENCOUNTER — Emergency Department (HOSPITAL_BASED_OUTPATIENT_CLINIC_OR_DEPARTMENT_OTHER): Payer: Medicare Other

## 2021-08-25 ENCOUNTER — Emergency Department (HOSPITAL_BASED_OUTPATIENT_CLINIC_OR_DEPARTMENT_OTHER)
Admission: EM | Admit: 2021-08-25 | Discharge: 2021-08-25 | Disposition: A | Discharge status: Home / Self Care | Payer: Medicare Other | Attending: Emergency Medicine | Admitting: Emergency Medicine

## 2021-08-25 DIAGNOSIS — I10 Essential (primary) hypertension: Secondary | ICD-10-CM | POA: Insufficient documentation

## 2021-08-25 DIAGNOSIS — M1711 Unilateral primary osteoarthritis, right knee: Secondary | ICD-10-CM | POA: Diagnosis not present

## 2021-08-25 DIAGNOSIS — M25561 Pain in right knee: Secondary | ICD-10-CM | POA: Insufficient documentation

## 2021-08-25 MED ORDER — OXYCODONE-ACETAMINOPHEN 5-325 MG PO TABS
1.0000 | ORAL_TABLET | Freq: Once | ORAL | Status: AC
  Administered 2021-08-25: 1 via ORAL
  Filled 2021-08-25: qty 1

## 2021-08-25 MED ORDER — DICLOFENAC SODIUM 1 % EX GEL: 2.0000 g | Freq: Four times a day (QID) | CUTANEOUS | 0 refills | Status: DC | PRN

## 2021-08-25 NOTE — ED Provider Notes (Signed)
Emergency Department Provider Note   I have reviewed the triage vital signs and the nursing notes.   HISTORY  Chief Complaint Knee Pain   HPI Michele Williamson is a 81 y.o. female presents to the emergency department for evaluation of right knee pain after standing bent over position.  She went to stand extending the right knee and felt sudden pain with a feeling that her knee had shifted.  Denies any visible deformity or concern for dislocation.  No shifting of the knee cap per report.  She is not having any numbness or tingling to the foot.  She is mainly having pain in the right lateral knee with some radiation down to the ankle of the denies specific tenderness in the ankle or hip.  No falls or head injury. No leg/calf swelling.    Past Medical History:  Diagnosis Date   Arthritis    shoulder, hands , feet, knees,   Complication of anesthesia    Heart murmur    Hyperglycemia    Hyperlipemia    Hypertension    PONV (postoperative nausea and vomiting)     Review of Systems  Constitutional: No fever/chills Cardiovascular: Denies chest pain. Respiratory: Denies shortness of breath. Gastrointestinal: No abdominal pain.  Genitourinary: Negative for dysuria. Musculoskeletal: Positive right knee pain.  Skin: Negative for rash. Neurological: Negative for numbness/weakness.    ____________________________________________   PHYSICAL EXAM:  VITAL SIGNS: ED Triage Vitals  Enc Vitals Group     BP 08/25/21 2045 (!) 157/70     Pulse Rate 08/25/21 2045 81     Resp 08/25/21 2045 18     Temp 08/25/21 2045 98.5 F (36.9 C)     Temp Source 08/25/21 2045 Oral     SpO2 08/25/21 2045 94 %     Weight 08/25/21 2045 160 lb (72.6 kg)     Height 08/25/21 2045 5\' 4"  (1.626 m)   Constitutional: Alert and oriented. Well appearing and in no acute distress. Eyes: Conjunctivae are normal. Head: Atraumatic. Nose: No congestion/rhinnorhea. Mouth/Throat: Mucous membranes are moist.    Neck: No stridor.  Cardiovascular: Normal rate, regular rhythm. Good peripheral circulation. Grossly normal heart sounds.   Respiratory: Normal respiratory effort.  No retractions. Lungs CTAB. Gastrointestinal: Soft and nontender. No distention.  Musculoskeletal: Tenderness to the right lateral knee with mild effusion.  No joint crepitus.  No joint laxity.  No tenderness to the right ankle.  Normal range of motion of the right hip.  Neurologic:  Normal speech and language. No gross focal neurologic deficits are appreciated.  Skin:  Skin is warm, dry and intact. No rash noted. ____________________________________________   PROCEDURES  Procedure(s) performed:   Procedures  None  ____________________________________________   INITIAL IMPRESSION / ASSESSMENT AND PLAN / ED COURSE  Pertinent labs & imaging results that were available during my care of the patient were reviewed by me and considered in my medical decision making (see chart for details).   This patient is Presenting for Evaluation of knee pain, which does require a range of treatment options, and is a complaint that involves a high risk of morbidity and mortality.  The Differential Diagnoses include fracture, dislocation, compartment syndrome, DVT.  Critical Interventions-    Medications  oxyCODONE-acetaminophen (PERCOCET/ROXICET) 5-325 MG per tablet 1 tablet (1 tablet Oral Given 08/25/21 2117)    Reassessment after intervention: pain improved.    I did obtain Additional Historical Information from family at bedside.  Radiologic Tests Ordered, included knee x  ray. I independently interpreted the images and agree with radiology interpretation.    Medical Decision Making: Summary:  Patient presents emergency department with pain in the right knee after standing.  No appreciable joint laxity.  The neurovascular status of the right lower extremity is completely intact.  X-ray does not show any fracture.  Patient given  knee sleeve and advised to weight-bear as tolerated.  Discussed RICE management at home and gave contact information for sports medicine.  Reevaluation with update and discussion with patient and family. Discussed plan. Patient pleased with plan at discharge.   Disposition: discharge  ____________________________________________  FINAL CLINICAL IMPRESSION(S) / ED DIAGNOSES  Final diagnoses:  Acute pain of right knee     NEW OUTPATIENT MEDICATIONS STARTED DURING THIS VISIT:  Discharge Medication List as of 08/25/2021  9:57 PM     START taking these medications   Details  diclofenac Sodium (VOLTAREN) 1 % GEL Apply 2 g topically 4 (four) times daily as needed., Starting Sun 08/25/2021, Print        Note:  This document was prepared using Dragon voice recognition software and may include unintentional dictation errors.  Alona Bene, MD, [facility] [place] [facility]    Sander Remedios, Arlyss Repress, MD 08/29/21 [phone]

## 2021-08-27 ENCOUNTER — Ambulatory Visit: Payer: Medicare Other | Admitting: Family Medicine

## 2021-08-27 DIAGNOSIS — M25561 Pain in right knee: Secondary | ICD-10-CM | POA: Insufficient documentation

## 2021-08-30 ENCOUNTER — Ambulatory Visit (INDEPENDENT_AMBULATORY_CARE_PROVIDER_SITE_OTHER): Payer: Medicare Other | Admitting: Family

## 2021-08-30 ENCOUNTER — Telehealth: Payer: Self-pay | Admitting: Family

## 2021-08-30 VITALS — BP 119/36 | HR 74 | Temp 98.0°F | Resp 12 | Ht 63.0 in | Wt 168.0 lb

## 2021-08-30 DIAGNOSIS — G6289 Other specified polyneuropathies: Secondary | ICD-10-CM | POA: Diagnosis not present

## 2021-08-30 DIAGNOSIS — I10 Essential (primary) hypertension: Secondary | ICD-10-CM | POA: Diagnosis not present

## 2021-08-30 DIAGNOSIS — M25561 Pain in right knee: Secondary | ICD-10-CM | POA: Insufficient documentation

## 2021-08-30 DIAGNOSIS — G629 Polyneuropathy, unspecified: Secondary | ICD-10-CM

## 2021-08-30 HISTORY — DX: Polyneuropathy, unspecified: G62.9

## 2021-08-30 LAB — FOLATE: Folate: >24.2 ng/mL (ref >5.9)

## 2021-08-30 LAB — VITAMIN B12: Vitamin B-12: 403 pg/mL (ref 211–911)

## 2021-08-30 MED ORDER — GABAPENTIN 100 MG PO CAPS: 100.0000 mg | ORAL_CAPSULE | Freq: Three times a day (TID) | ORAL | 3 refills | Status: DC

## 2021-08-30 NOTE — Assessment & Plan Note (Addendum)
BP Readings from Last 3 Encounters:  08/30/21 (!) 119/36  08/25/21 (!) 158/68  11/09/20 133/64   DBP is low today. Pt is asymptomatic. Had 2D echo in 2022 which noted diastolic dysfunction, otherwise unremarkable.  I sent a noted to her cardiologist notifying him of this finding in case he would like to schedule any additional cardiac testing.

## 2021-08-30 NOTE — Patient Instructions (Signed)
Please start gabapentin 3x daily for neuropathy. Complete lab work prior to leaving.

## 2021-08-30 NOTE — Progress Notes (Signed)
Subjective:   By signing my name below, I, Cassell Clement, attest that this documentation has been prepared under the direction and in the presence of Alma Downs' Suvillivan, NP 08/30/2021.   Patient ID: Michele Williamson, female    DOB: 01/02/1941, 81 y.o.   MRN: [id number]  Chief Complaint  Patient presents with   neuropathy both feet    HPI Patient is in today for an office visit.  Neuropathy: Lately she has been suffering from constant bilateral foot pain, associated with a tingling sensation. At other clinic visits her pain was felt to be caused by neuropathy. Right knee pain: On 08/25/21 while bending over her right knee gave out on her. She presented to the ED. She did not tolerate percocet; this caused emesis so she switched to prn Aleve which has been helpful.  Headache: Today she complains of a slight headache. Gabapentin:  Previously she had an adverse reaction to Lyrica (confusion). However she does not remember what this was.  There are no preventive care reminders to display for this patient.   Past Medical History:  Diagnosis Date   Arthritis    shoulder, hands , feet, knees,   Complication of anesthesia    Heart murmur    Hyperglycemia    Hyperlipemia    Hypertension    PONV (postoperative nausea and vomiting)     Past Surgical History:  Procedure Laterality Date   BUNIONECTOMY Right    CARPAL TUNNEL RELEASE     CATARACT EXTRACTION     CESAREAN SECTION     CHOLECYSTECTOMY  06/18/99   REVERSE SHOULDER ARTHROPLASTY Left 06/28/2020   Procedure: REVERSE SHOULDER ARTHROPLASTY;  Surgeon: Francena Hanly, MD;  Location: [facility];  Service: Orthopedics;  Laterality: Left;    ROTATOR CUFF REPAIR Right     Family History  Problem Relation Age of Onset   Hypertension Mother    Heart disease Mother    Bone cancer Father    Hypertension Father    Hyperlipidemia Father    Melanoma Brother    Stomach cancer Maternal Grandmother    Obesity Son     Social History    Socioeconomic History   Marital status: Widowed    Spouse name: Not on file   Number of children: Not on file   Years of education: Not on file   Highest education level: Not on file  Occupational History   Not on file  Tobacco Use   Smoking status: Never   Smokeless tobacco: Never  Vaping Use   Vaping Use: Never used  Substance and Sexual Activity   Alcohol use: No   Drug use: No   Sexual activity: Yes    Birth control/protection: None  Other Topics Concern   Not on file  Social History Narrative   Lives in [place]   Has an adopted son born 06-17-2008   Husband died in June 18, 2006   Retired, worked as an Insurance underwriter   Enjoys senior center   Has a Veterinary surgeon   Completed HS   Social Determinants of Corporate investment banker Strain: Not on file  Food Insecurity: Not on file  Transportation Needs: Not on file  Physical Activity: Inactive (03/06/2020)   Exercise Vital Sign    Days of Exercise per Week: 0 days    Minutes of Exercise per Session: 0 min  Stress: Not on file  Social Connections: Not on file  Intimate Partner Violence: Not on file    Outpatient Medications  Prior to Visit  Medication Sig Dispense Refill   alendronate (FOSAMAX) 70 MG tablet Take 1 tablet (70 mg total) by mouth every 7 (seven) days. Take with a full glass of water on an empty stomach. 12 tablet 3   aspirin EC 81 MG tablet Take 1 tablet (81 mg total) by mouth daily. Swallow whole. 90 tablet 3   Calcium Carbonate-Vitamin D 600-400 MG-UNIT tablet Take 1 tablet by mouth 2 (two) times daily. (Patient taking differently: Take 1 tablet by mouth daily.)     diclofenac Sodium (VOLTAREN) 1 % GEL Apply 2 g topically 4 (four) times daily as needed. 100 g 0   furosemide (LASIX) 80 MG tablet TAKE 1 TABLET BY MOUTH  DAILY 90 tablet 1   lisinopril (ZESTRIL) 10 MG tablet TAKE 1 TABLET BY MOUTH  DAILY 90 tablet 1   loratadine (CLARITIN) 10 MG tablet Take 10 mg by mouth daily. [employer]     Multiple  Vitamins-Minerals (MULTIVITAMIN WITH MINERALS) tablet Take 1 tablet by mouth daily.     omeprazole (PRILOSEC) 20 MG capsule TAKE 1 CAPSULE BY MOUTH  DAILY 90 capsule 1   rosuvastatin (CRESTOR) 20 MG tablet Take 1 tablet (20 mg total) by mouth at bedtime. 90 tablet 1   No facility-administered medications prior to visit.    Allergies  Allergen Reactions   Oxycodone-Acetaminophen Nausea Only   Penicillins     Childhood Pt received Ancef on 06-28-20 and tolerated it well   Pregabalin     Dizziness   Tramadol Nausea And Vomiting    Review of Systems  Musculoskeletal:  Positive for joint pain (Right knee) and myalgias (Bilateral foot pain).  Neurological:  Positive for tingling (Bilateral Feet) and headaches.       Objective:    Physical Exam Constitutional:      Appearance: Normal appearance.  HENT:     Head: Normocephalic and atraumatic.     Right Ear: External ear normal.     Left Ear: External ear normal.  Eyes:     Extraocular Movements: Extraocular movements intact.     Pupils: Pupils are equal, round, and reactive to light.  Cardiovascular:     Rate and Rhythm: Normal rate and regular rhythm.     Pulses: Normal pulses.     Heart sounds: Normal heart sounds. No murmur heard.    No gallop.  Pulmonary:     Effort: Pulmonary effort is normal. No respiratory distress.     Breath sounds: Normal breath sounds. No wheezing or rales.  Skin:    General: Skin is warm and dry.  Neurological:     Mental Status: She is alert and oriented to person, place, and time.  Psychiatric:        Mood and Affect: Mood normal.        Behavior: Behavior normal.        Judgment: Judgment normal.     BP (!) 119/36 (BP Location: Left Arm, Cuff Size: Large)   Pulse 74   Temp 98 F (36.7 C) (Oral)   Resp 12   Ht 5\' 3"  (1.6 m)   Wt 168 lb (76.2 kg)   SpO2 98%   BMI 29.76 kg/m  Wt Readings from Last 3 Encounters:  08/30/21 168 lb (76.2 kg)  08/25/21 160 lb (72.6 kg)  11/09/20 164  lb (74.4 kg)       Assessment & Plan:   Problem List Items Addressed This Visit  Unprioritized   Peripheral neuropathy - Primary    Uncontrolled.  Did not tolerate lyrica. Reports remote use of gabapentin. Does not remember adverse side effects. Will rx with trial of gabapentin 100mg  tid and check b12/folate.       Relevant Medications   gabapentin (NEURONTIN) 100 MG capsule   Other Relevant Orders   B12   Folate   Hypertension    BP Readings from Last 3 Encounters:  08/30/21 (!) 119/36  08/25/21 (!) 158/68  11/09/20 133/64  DBP is low today. Pt is asymptomatic. Had 2D echo in 2022 which noted diastolic dysfunction, otherwise unremarkable.  I sent a noted to her cardiologist notifying him of this finding in case he would like to schedule any additional cardiac testing.      Acute pain of right knee    New. Seems to be improving with prn aleve. She has an appointment with ortho in July. She will reach out to me sooner if symptoms do not continue to improve.         Meds ordered this encounter  Medications   gabapentin (NEURONTIN) 100 MG capsule    Sig: Take 1 capsule (100 mg total) by mouth 3 (three) times daily.    Dispense:  90 capsule    Refill:  3    Order Specific Question:   Supervising Provider    Answer:   August A [[id number]]    I, Danise Edge, NP, personally preformed the services described in this documentation.  All medical record entries made by the scribe were at my direction and in my presence.  I have reviewed the chart and discharge instructions (if applicable) and agree that the record reflects my personal performance and is accurate and complete. 08/30/2021   I,Amber Collins,acting as a scribe for 09/01/2021, NP.,have documented all relevant documentation on the behalf of Lemont Fillers, NP,as directed by  Lemont Fillers, NP while in the presence of Lemont Fillers, NP.    Lemont Fillers, NP

## 2021-08-30 NOTE — Assessment & Plan Note (Signed)
New. Seems to be improving with prn aleve. She has an appointment with ortho in July. She will reach out to me sooner if symptoms do not continue to improve.

## 2021-08-30 NOTE — Assessment & Plan Note (Signed)
Uncontrolled.  Did not tolerate lyrica. Reports remote use of gabapentin. Does not remember adverse side effects. Will rx with trial of gabapentin 100mg  tid and check b12/folate.

## 2021-08-30 NOTE — Telephone Encounter (Signed)
Patient notified of provider's discussion and recommendations. She verbalized understanding

## 2021-08-30 NOTE — Telephone Encounter (Signed)
Please advise pt that I reviewed her blood pressure with Dr. Dulce Sellar and he recommended that she stop lisinopril.  I will plan to see her back in August as scheduled.

## 2021-08-30 NOTE — Telephone Encounter (Signed)
-----   Message from Baldo Daub, MD sent at 08/30/2021  1:54 PM EDT ----- Regarding: RE: I would stop her lisinopril with a diastolic< 60 May be due to neuropathy ----- Message ----- From: Sandford Craze, NP Sent: 08/30/2021  10:54 AM EDT To: Baldo Daub, MD  Dear Dr. Dulce Sellar,  We rechecked her blood pressure several times today and her DBP was in the 30's. She had an echo in 2022 that looked ok, just some diastolic dysfunction. Any other thoughts or concerns on this from your end?   Thanks,  [employer]

## 2021-08-31 ENCOUNTER — Other Ambulatory Visit: Payer: Self-pay | Admitting: Family

## 2021-09-23 NOTE — Progress Notes (Unsigned)
Cardiology Office Note:    Date:  09/24/2021   ID:  Michele Williamson, DOB 1940/07/02, MRN [id number]  PCP:  Sandford Craze, NP  Cardiologist:  Norman Herrlich, MD    Referring MD: Sandford Craze, NP    ASSESSMENT:    1. Agatston coronary artery calcium score less than 100   2. Coronary artery disease involving native coronary artery of native heart without angina pectoris   3. Benign essential hypertension   4. LBBB (left bundle branch block)   5. Mixed hyperlipidemia   6. Bilateral edema of lower extremity    PLAN:    In order of problems listed above:  She has mild CAD we will continue medical therapy including aspirin and her high intensity statin [place] Heart Association class I Her blood pressure today is at the upper limits of normal typically office readings are 5-10 higher than home I think the best approach here is to regularly check her blood pressure at home validated device and good technique and will institute therapy if she had persistent systolics greater than 140 and I would give her a low-dose ARB like losartan 25 to 50 mg daily. Stable EKG pattern Continue her high intensity statin with CAD Legs look good she will continue her loop diuretic also helpful for hypertension control   Next appointment: 1 year   Medication Adjustments/Labs and Tests Ordered: Current medicines are reviewed at length with the patient today.  Concerns regarding medicines are outlined above.  Orders Placed This Encounter  Procedures   EKG 12-Lead   No orders of the defined types were placed in this encounter.   Chief Complaint  Patient presents with   Annual Exam    History of Present Illness:    Michele Williamson is a 82 y.o. female with a hx of chronic lower extremity edema hypertension hyperlipidemia and left bundle branch block last seen 03/09/2020.She underwent echocardiogram performed 04/02/2020 the left ventricle was normal size no hypertrophy EF 60 to 65% and  normal diastolic filling pressure.  The right ventricle was normal there is no significant valvular abnormality. proBNP level was quite low and consistent with heart failure.  153. Cardiac CTA reported 04/12/2020 showed a calcium score of 87.7 53rd percentile for age and sex moderate CAD was present with tandem proximal lesions LAD first 25 to 49% second 50 to 69%.  Fractional flow reserve left anterior descending coronary artery was normal  Compliance with diet, lifestyle and medications: Yes  She was intolerant of an ACE inhibitor with hypotension discontinued She sporadically checks her blood pressure she is not using good technique she gets systolics up to 1 60-1 70 wants to know if she needs to restart antihypertensive agents. Think the best approach here is for her to trend her blood pressure at home with good technique given instructions education and I probably would not start an agent if her diastolics are less than 60. She is having no chest pain edema shortness of breath palpitation or syncope She has just returned from a 2-week vacation with activity She tolerates a statin without muscle pain or weakness Past Medical History:  Diagnosis Date   Arthritis    shoulder, hands , feet, knees,   Complication of anesthesia    Heart murmur    Hyperglycemia    Hyperlipemia    Hypertension    PONV (postoperative nausea and vomiting)     Past Surgical History:  Procedure Laterality Date   BUNIONECTOMY Right    CARPAL  TUNNEL RELEASE     CATARACT EXTRACTION     CESAREAN SECTION     CHOLECYSTECTOMY  06-15-99   REVERSE SHOULDER ARTHROPLASTY Left 06/28/2020   Procedure: REVERSE SHOULDER ARTHROPLASTY;  Surgeon: Francena Hanly, MD;  Location: [facility];  Service: Orthopedics;  Laterality: Left;    ROTATOR CUFF REPAIR Right     Current Medications: Current Meds  Medication Sig   alendronate (FOSAMAX) 70 MG tablet Take 1 tablet (70 mg total) by mouth every 7 (seven) days. Take with a full  glass of water on an empty stomach.   aspirin EC 81 MG tablet Take 1 tablet (81 mg total) by mouth daily. Swallow whole.   CALCIUM CARBONATE-VITAMIN D PO Take 1 tablet by mouth daily. 600-400 mg   diclofenac Sodium (VOLTAREN) 1 % GEL Apply 2 g topically 4 (four) times daily as needed.   furosemide (LASIX) 80 MG tablet TAKE 1 TABLET BY MOUTH DAILY   gabapentin (NEURONTIN) 100 MG capsule Take 100 mg by mouth daily.   loratadine (CLARITIN) 10 MG tablet Take 10 mg by mouth daily. [employer] Brand   Multiple Vitamins-Minerals (MULTIVITAMIN WITH MINERALS) tablet Take 1 tablet by mouth daily.   omeprazole (PRILOSEC) 20 MG capsule TAKE 1 CAPSULE BY MOUTH DAILY   rosuvastatin (CRESTOR) 20 MG tablet TAKE 1 TABLET BY MOUTH AT  BEDTIME     Allergies:   Oxycodone-acetaminophen, Penicillins, Pregabalin, and Tramadol   Social History   Socioeconomic History   Marital status: Widowed    Spouse name: Not on file   Number of children: Not on file   Years of education: Not on file   Highest education level: Not on file  Occupational History   Not on file  Tobacco Use   Smoking status: Never   Smokeless tobacco: Never  Vaping Use   Vaping Use: Never used  Substance and Sexual Activity   Alcohol use: No   Drug use: No   Sexual activity: Yes    Birth control/protection: None  Other Topics Concern   Not on file  Social History Narrative   Lives in [place]   Has an adopted son born 14-Jun-2008   Husband died in 2006-06-15   Retired, worked as an Insurance underwriter   Enjoys senior center   Has a Veterinary surgeon   Completed HS   Social Determinants of Corporate investment banker Strain: Not on file  Food Insecurity: Not on file  Transportation Needs: Not on file  Physical Activity: Inactive (03/06/2020)   Exercise Vital Sign    Days of Exercise per Week: 0 days    Minutes of Exercise per Session: 0 min  Stress: Not on file  Social Connections: Not on file     Family History: The patient's family history  includes Bone cancer in her father; Heart disease in her mother; Hyperlipidemia in her father; Hypertension in her father and mother; Melanoma in her brother; Obesity in her son; Stomach cancer in her maternal grandmother. ROS:   Please see the history of present illness.    All other systems reviewed and are negative.  EKGs/Labs/Other Studies Reviewed:    The following studies were reviewed today:  EKG:  EKG ordered today and personally reviewed.  The ekg ordered today demonstrates sinus rhythm left bundle branch block unchanged  Recent Labs: 11/09/2020: ALT 12; BUN 25; Creatinine, Ser 0.95; Hemoglobin 13.6; Platelets 234.0; Potassium 4.9; Sodium 140  Recent Lipid Panel    Component Value Date/Time   CHOL 204 (  H) 11/09/2020 0848   TRIG 367.0 (H) 11/09/2020 0848   HDL 45.10 11/09/2020 0848   CHOLHDL 5 11/09/2020 0848   VLDL 73.4 (H) 11/09/2020 0848   LDLDIRECT 99.0 11/09/2020 0848    Physical Exam:    VS:  BP 140/80 (BP Location: Right Arm, Patient Position: Sitting, Cuff Size: Normal)   Pulse 82   Ht 5\' 3"  (1.6 m)   Wt 167 lb (75.8 kg)   SpO2 96%   BMI 29.58 kg/m     Wt Readings from Last 3 Encounters:  09/24/21 167 lb (75.8 kg)  08/30/21 168 lb (76.2 kg)  08/25/21 160 lb (72.6 kg)     GEN:  Well nourished, well developed in no acute distress HEENT: Normal NECK: No JVD; No carotid bruits LYMPHATICS: No lymphadenopathy CARDIAC: Paradoxical second heart sound RRR, no murmurs, rubs, gallops RESPIRATORY:  Clear to auscultation without rales, wheezing or rhonchi  ABDOMEN: Soft, non-tender, non-distended MUSCULOSKELETAL:  No edema; No deformity  SKIN: Warm and dry NEUROLOGIC:  Alert and oriented x 3 PSYCHIATRIC:  Normal affect    Signed, 08/27/21, MD  09/24/2021 10:46 AM    [facility]

## 2021-09-24 ENCOUNTER — Ambulatory Visit: Payer: Medicare Other | Admitting: Cardiology

## 2021-09-24 ENCOUNTER — Encounter: Payer: Self-pay | Admitting: Cardiology

## 2021-09-24 VITALS — BP 140/80 | HR 82 | Ht 63.0 in | Wt 167.0 lb

## 2021-09-24 DIAGNOSIS — R931 Abnormal findings on diagnostic imaging of heart and coronary circulation: Secondary | ICD-10-CM | POA: Diagnosis not present

## 2021-09-24 DIAGNOSIS — E782 Mixed hyperlipidemia: Secondary | ICD-10-CM | POA: Diagnosis not present

## 2021-09-24 DIAGNOSIS — I2510 Atherosclerotic heart disease of native coronary artery without angina pectoris: Secondary | ICD-10-CM | POA: Diagnosis not present

## 2021-09-24 DIAGNOSIS — I447 Left bundle-branch block, unspecified: Secondary | ICD-10-CM | POA: Diagnosis not present

## 2021-09-24 DIAGNOSIS — I10 Essential (primary) hypertension: Secondary | ICD-10-CM

## 2021-09-24 DIAGNOSIS — R600 Localized edema: Secondary | ICD-10-CM

## 2021-09-24 NOTE — Patient Instructions (Addendum)
Medication Instructions:  Your physician recommends that you continue on your current medications as directed. Please refer to the Current Medication list given to you today.  *If you need a refill on your cardiac medications before your next appointment, please call your pharmacy*   Lab Work: None If you have labs (blood work) drawn today and your tests are completely normal, you will receive your results only by: MyChart Message (if you have MyChart) OR A paper copy in the mail If you have any lab test that is abnormal or we need to change your treatment, we will call you to review the results.   Testing/Procedures: None   Follow-Up: At [facility], you and your health needs are our priority.  As part of our continuing mission to provide you with exceptional heart care, we have created designated Provider Care Teams.  These Care Teams include your primary Cardiologist (physician) and Advanced Practice Providers (APPs -  Physician Assistants and Nurse Practitioners) who all work together to provide you with the care you need, when you need it.  We recommend signing up for the patient portal called "MyChart".  Sign up information is provided on this After Visit Summary.  MyChart is used to connect with patients for Virtual Visits (Telemedicine).  Patients are able to view lab/test results, encounter notes, upcoming appointments, etc.  Non-urgent messages can be sent to your provider as well.   To learn more about what you can do with MyChart, go to [email or web address].    Your next appointment:   1 year(s)  The format for your next appointment:   In Person  Provider:   Norman Herrlich, MD    Other Instructions None  Important Information About Sugar          [facility]  Tips to measure your blood pressure correctly  To determine whether you have hypertension, a medical professional will take a blood pressure reading. How you prepare for the test, the position of  your arm, and other factors can change a blood pressure reading by 10% or more. That could be enough to hide high blood pressure, start you on a drug you don't really need, or lead your doctor to incorrectly adjust your medications. National and international guidelines offer specific instructions for measuring blood pressure. If a doctor, nurse, or medical assistant isn't doing it right, don't hesitate to ask him or her to get with the guidelines. Here's what you can do to ensure a correct reading:  Don't drink a caffeinated beverage or smoke during the 30 minutes before the test.  Sit quietly for five minutes before the test begins.  During the measurement, sit in a chair with your feet on the floor and your arm supported so your elbow is at about heart level.  The inflatable part of the cuff should completely cover at least 80% of your upper arm, and the cuff should be placed on bare skin, not over a shirt.  Don't talk during the measurement.  Have your blood pressure measured twice, with a brief break in between. If the readings are different by 5 points or more, have it done a third time. There are times to break these rules. If you sometimes feel lightheaded when getting out of bed in the morning or when you stand after sitting, you should have your blood pressure checked while seated and then while standing to see if it falls from one position to the next. Because blood pressure varies throughout the day, your doctor  will rarely diagnose hypertension on the basis of a single reading. Instead, he or she will want to confirm the measurements on at least two occasions, usually within a few weeks of one another. The exception to this rule is if you have a blood pressure reading of 180/110 mm Hg or higher. A result this high usually calls for prompt treatment. It's also a good idea to have your blood pressure measured in both arms at least once, since the reading in one arm (usually the right) may be  higher than that in the left. A 2014 study in The American Journal of Medicine of nearly 3,400 people found average arm- to-arm differences in systolic blood pressure of about 5 points. The higher number should be used to make treatment decisions. In 2017, new guidelines from the American Heart Association, the [employer] of Cardiology, and nine other health organizations lowered the diagnosis of high blood pressure to 130/80 mm Hg or higher for all adults. The guidelines also redefined the various blood pressure categories to now include normal, elevated, Stage 1 hypertension, Stage 2 hypertension, and hypertensive crisis (see "Blood pressure categories"). Blood pressure categories  Blood pressure category SYSTOLIC (upper number)  DIASTOLIC (lower number)  Normal Less than 120 mm Hg and Less than 80 mm Hg  Elevated 120-129 mm Hg and Less than 80 mm Hg  High blood pressure: Stage 1 hypertension 130-139 mm Hg or 80-89 mm Hg  High blood pressure: Stage 2 hypertension 140 mm Hg or higher or 90 mm Hg or higher  Hypertensive crisis (consult your doctor immediately) Higher than 180 mm Hg and/or Higher than 120 mm Hg  Source: American Heart Association and American Stroke Association. For more on getting your blood pressure under control, buy Controlling Your Blood Pressure, a Special Health Report from [facility]  Physical Activity: Inactive (03/06/2020)   Exercise Vital Sign    Days of Exercise per Week: 0 days    Minutes of Exercise per Session: 0 min

## 2021-09-26 DIAGNOSIS — F3340 Major depressive disorder, recurrent, in remission, unspecified: Secondary | ICD-10-CM | POA: Insufficient documentation

## 2021-09-26 DIAGNOSIS — R7303 Prediabetes: Secondary | ICD-10-CM | POA: Insufficient documentation

## 2021-09-26 DIAGNOSIS — I509 Heart failure, unspecified: Secondary | ICD-10-CM | POA: Insufficient documentation

## 2021-09-26 DIAGNOSIS — N1831 Chronic kidney disease, stage 3a: Secondary | ICD-10-CM | POA: Insufficient documentation

## 2021-10-11 ENCOUNTER — Ambulatory Visit: Payer: Medicare Other | Admitting: Family

## 2021-10-22 ENCOUNTER — Ambulatory Visit: Payer: Medicare Other | Admitting: Family

## 2021-10-22 DIAGNOSIS — M1711 Unilateral primary osteoarthritis, right knee: Secondary | ICD-10-CM | POA: Insufficient documentation

## 2021-10-29 ENCOUNTER — Ambulatory Visit (INDEPENDENT_AMBULATORY_CARE_PROVIDER_SITE_OTHER): Payer: Medicare Other | Admitting: Family

## 2021-10-29 VITALS — BP 145/54 | HR 76 | Temp 98.5°F | Resp 16 | Wt 169.0 lb

## 2021-10-29 DIAGNOSIS — G6289 Other specified polyneuropathies: Secondary | ICD-10-CM

## 2021-10-29 DIAGNOSIS — M1990 Unspecified osteoarthritis, unspecified site: Secondary | ICD-10-CM

## 2021-10-29 DIAGNOSIS — E785 Hyperlipidemia, unspecified: Secondary | ICD-10-CM

## 2021-10-29 DIAGNOSIS — I10 Essential (primary) hypertension: Secondary | ICD-10-CM

## 2021-10-29 DIAGNOSIS — K219 Gastro-esophageal reflux disease without esophagitis: Secondary | ICD-10-CM | POA: Diagnosis not present

## 2021-10-29 DIAGNOSIS — I5032 Chronic diastolic (congestive) heart failure: Secondary | ICD-10-CM

## 2021-10-29 LAB — BASIC METABOLIC PANEL
BUN: 19 mg/dL (ref 6–23)
CO2: 29 mEq/L (ref 19–32)
Calcium: 9.7 mg/dL (ref 8.4–10.5)
Chloride: 106 mEq/L (ref 96–112)
Creatinine, Ser: 0.83 mg/dL (ref 0.40–1.20)
GFR: 67.24 mL/min (ref >60.00)
Glucose, Bld: 87 mg/dL (ref 70–99)
Potassium: 4.1 mEq/L (ref 3.5–5.1)
Sodium: 144 mEq/L (ref 135–145)

## 2021-10-29 LAB — LIPID PANEL
Cholesterol: 145 mg/dL (ref 0–200)
HDL: 42.1 mg/dL (ref >39.00)
NonHDL: 102.49
Total CHOL/HDL Ratio: 3
Triglycerides: 283 mg/dL — ABNORMAL HIGH (ref 0.0–149.0)
VLDL: 56.6 mg/dL — ABNORMAL HIGH (ref 0.0–40.0)

## 2021-10-29 LAB — LDL CHOLESTEROL, DIRECT: Direct LDL: 66 mg/dL

## 2021-10-29 MED ORDER — GABAPENTIN 300 MG PO CAPS: 300.0000 mg | ORAL_CAPSULE | Freq: Three times a day (TID) | ORAL | 3 refills | Status: DC

## 2021-10-29 MED ORDER — OMEPRAZOLE 20 MG PO CPDR: 20.0000 mg | DELAYED_RELEASE_CAPSULE | Freq: Every day | ORAL | 1 refills | Status: DC | PRN

## 2021-10-29 NOTE — Assessment & Plan Note (Signed)
>>  ASSESSMENT AND PLAN FOR BENIGN ESSENTIAL HYPERTENSION WRITTEN ON 10/29/2021 10:18 AM BY O'SULLIVAN, Jonah Nestle, NP  Not on anti-hypertensive.  bp OK today. Monitor.

## 2021-10-29 NOTE — Assessment & Plan Note (Signed)
Following with orthopedics.  Recommended that she d/c aleve and begin tylenol prn.

## 2021-10-29 NOTE — Assessment & Plan Note (Signed)
Some improvement on gabapentin 200mg , will try to increase to 300mg  tid.

## 2021-10-29 NOTE — Assessment & Plan Note (Signed)
Appears euvolemic. Continue current dose of lasix.  °

## 2021-10-29 NOTE — Progress Notes (Signed)
Subjective:     Patient ID: Michele Williamson, female    DOB: 11-11-1940, 81 y.o.   MRN: [id number]  Chief Complaint  Patient presents with   Peripheral Neuropathy    Here for follow up    HPI Patient is in today for follow up.  Last visit she complained of bilateral foot pain/tingling. Last visit we gave her a re-trial with gabapentin. She increased from 100mg  to 200mg  and is taking twice daily with "2 aleve."  Reports pain is "bearable."   Right knee pain- saw ortho in July. She plans to join the [employer].   HTN-not on antihypertensive.  BP Readings from Last 3 Encounters:  10/29/21 (!) 145/54  09/24/21 140/80  08/30/21 (!) 119/36     Health Maintenance Due  Topic Date Due   INFLUENZA VACCINE  10/01/2021    Past Medical History:  Diagnosis Date   Arthritis    shoulder, hands , feet, knees,   Complication of anesthesia    Heart murmur    Hyperglycemia    Hyperlipemia    Hypertension    PONV (postoperative nausea and vomiting)     Past Surgical History:  Procedure Laterality Date   BUNIONECTOMY Right    CARPAL TUNNEL RELEASE     CATARACT EXTRACTION     CESAREAN SECTION     CHOLECYSTECTOMY  1999/06/10   REVERSE SHOULDER ARTHROPLASTY Left 06/28/2020   Procedure: REVERSE SHOULDER ARTHROPLASTY;  Surgeon: 2002, MD;  Location: [facility];  Service: Orthopedics;  Laterality: Left;  06/30/2020   ROTATOR CUFF REPAIR Right     Family History  Problem Relation Age of Onset   Hypertension Mother    Heart disease Mother    Bone cancer Father    Hypertension Father    Hyperlipidemia Father    Melanoma Brother    Stomach cancer Maternal Grandmother    Obesity Son     Social History   Socioeconomic History   Marital status: Widowed    Spouse name: Not on file   Number of children: Not on file   Years of education: Not on file   Highest education level: Not on file  Occupational History   Not on file  Tobacco Use   Smoking status: Never   Smokeless tobacco: Never   Vaping Use   Vaping Use: Never used  Substance and Sexual Activity   Alcohol use: No   Drug use: No   Sexual activity: Yes    Birth control/protection: None  Other Topics Concern   Not on file  Social History Narrative   Lives in [place]   Has an adopted son born June 09, 2008   Husband died in 06-10-06   Retired, worked as an 2011   [facility]   Has a 2009   Completed HS   Social Determinants of Insurance underwriter Strain: Not on file  Food Insecurity: Not on file  Transportation Needs: Not on file  Physical Activity: Inactive (03/06/2020)   Exercise Vital Sign    Days of Exercise per Week: 0 days    Minutes of Exercise per Session: 0 min  Stress: Not on file  Social Connections: Not on file  Intimate Partner Violence: Not on file    Outpatient Medications Prior to Visit  Medication Sig Dispense Refill   alendronate (FOSAMAX) 70 MG tablet Take 1 tablet (70 mg total) by mouth every 7 (seven) days. Take with a full glass of water on an empty stomach.  12 tablet 3   aspirin EC 81 MG tablet Take 1 tablet (81 mg total) by mouth daily. Swallow whole. 90 tablet 3   CALCIUM CARBONATE-VITAMIN D PO Take 1 tablet by mouth daily. 600-400 mg     diclofenac Sodium (VOLTAREN) 1 % GEL Apply 2 g topically 4 (four) times daily as needed. 100 g 0   furosemide (LASIX) 80 MG tablet TAKE 1 TABLET BY MOUTH DAILY 90 tablet 1   loratadine (CLARITIN) 10 MG tablet Take 10 mg by mouth daily. [employer]     Multiple Vitamins-Minerals (MULTIVITAMIN WITH MINERALS) tablet Take 1 tablet by mouth daily.     rosuvastatin (CRESTOR) 20 MG tablet TAKE 1 TABLET BY MOUTH AT  BEDTIME 90 tablet 1   gabapentin (NEURONTIN) 100 MG capsule Take 100 mg by mouth daily.     omeprazole (PRILOSEC) 20 MG capsule TAKE 1 CAPSULE BY MOUTH DAILY 90 capsule 1   No facility-administered medications prior to visit.    Allergies  Allergen Reactions   Oxycodone-Acetaminophen Nausea Only    Penicillins     Childhood Pt received Ancef on 06-28-20 and tolerated it well   Pregabalin     Dizziness   Tramadol Nausea And Vomiting    ROS    See HPI Objective:    Physical Exam Constitutional:      General: She is not in acute distress.    Appearance: Normal appearance. She is well-developed.  HENT:     Head: Normocephalic and atraumatic.     Right Ear: External ear normal.     Left Ear: External ear normal.  Eyes:     General: No scleral icterus. Neck:     Thyroid: No thyromegaly.  Cardiovascular:     Rate and Rhythm: Normal rate and regular rhythm.     Heart sounds: Normal heart sounds. No murmur heard. Pulmonary:     Effort: Pulmonary effort is normal. No respiratory distress.     Breath sounds: Normal breath sounds. No wheezing.  Musculoskeletal:        General: No swelling.     Cervical back: Neck supple.  Skin:    General: Skin is warm and dry.  Neurological:     Mental Status: She is alert and oriented to person, place, and time.  Psychiatric:        Mood and Affect: Mood normal.        Behavior: Behavior normal.        Thought Content: Thought content normal.        Judgment: Judgment normal.     BP (!) 145/54 (BP Location: Right Arm, Patient Position: Sitting, Cuff Size: Small)   Pulse 76   Temp 98.5 F (36.9 C) (Oral)   Resp 16   Wt 169 lb (76.7 kg)   SpO2 96%   BMI 29.94 kg/m  Wt Readings from Last 3 Encounters:  10/29/21 169 lb (76.7 kg)  09/24/21 167 lb (75.8 kg)  08/30/21 168 lb (76.2 kg)       Assessment & Plan:   Problem List Items Addressed This Visit       Unprioritized   Peripheral neuropathy    Some improvement on gabapentin 200mg , will try to increase to 300mg  tid.       Relevant Medications   gabapentin (NEURONTIN) 300 MG capsule   Osteoarthritis    Following with orthopedics.  Recommended that she d/c aleve and begin tylenol prn.       Hypertension   Relevant  Orders   Basic metabolic panel   Hyperlipemia -  Primary   Relevant Orders   Lipid panel   Esophageal reflux    She is concerned about the risk of dementia with PPI. She is ok without the medication unless she eats [place] food.  She will change omeprazole to 20mg  once daily "PRN."       Relevant Medications   omeprazole (PRILOSEC) 20 MG capsule   Chronic diastolic CHF (congestive heart failure) (HCC)    Appears euvolemic. Continue current dose of lasix.       Benign essential hypertension    Not on anti-hypertensive.  bp OK today. Monitor.        I have discontinued M. Marinos's gabapentin. I have also changed her omeprazole. Additionally, I am having her start on gabapentin. Lastly, I am having her maintain her loratadine, aspirin EC, multivitamin with minerals, alendronate, diclofenac Sodium, furosemide, rosuvastatin, and CALCIUM CARBONATE-VITAMIN D PO.  Meds ordered this encounter  Medications   gabapentin (NEURONTIN) 300 MG capsule    Sig: Take 1 capsule (300 mg total) by mouth 3 (three) times daily.    Dispense:  90 capsule    Refill:  3    Order Specific Question:   Supervising Provider    Answer:   Michele A [[id number]]   omeprazole (PRILOSEC) 20 MG capsule    Sig: Take 1 capsule (20 mg total) by mouth daily as needed.    Dispense:  90 capsule    Refill:  1    Requesting 1 year supply    Order Specific Question:   Supervising Provider    Answer:   Danise Edge A [[id number]]

## 2021-10-29 NOTE — Assessment & Plan Note (Signed)
Not on anti-hypertensive.  bp OK today. Monitor.

## 2021-10-29 NOTE — Assessment & Plan Note (Signed)
She is concerned about the risk of dementia with PPI. She is ok without the medication unless she eats [place] food.  She will change omeprazole to 20mg  once daily "PRN."

## 2021-11-18 ENCOUNTER — Ambulatory Visit (INDEPENDENT_AMBULATORY_CARE_PROVIDER_SITE_OTHER): Payer: Medicare Other | Admitting: Family

## 2021-11-18 VITALS — BP 139/54 | HR 75 | Temp 98.0°F | Resp 16 | Wt 167.0 lb

## 2021-11-18 DIAGNOSIS — G6289 Other specified polyneuropathies: Secondary | ICD-10-CM

## 2021-11-18 DIAGNOSIS — M5432 Sciatica, left side: Secondary | ICD-10-CM

## 2021-11-18 DIAGNOSIS — Z23 Encounter for immunization: Secondary | ICD-10-CM | POA: Diagnosis not present

## 2021-11-18 DIAGNOSIS — R600 Localized edema: Secondary | ICD-10-CM

## 2021-11-18 HISTORY — DX: Localized edema: R60.0

## 2021-11-18 HISTORY — DX: Sciatica, left side: M54.32

## 2021-11-18 MED ORDER — MELOXICAM 7.5 MG PO TABS: 7.5000 mg | ORAL_TABLET | Freq: Every day | ORAL | 0 refills | Status: DC

## 2021-11-18 NOTE — Patient Instructions (Signed)
Please begin meloxicam once daily for back pain.

## 2021-11-18 NOTE — Assessment & Plan Note (Signed)
Symptoms are improved on increased dose of gabapentin. Continue same.

## 2021-11-18 NOTE — Assessment & Plan Note (Signed)
New. Trial of meloxicam and low back exercise as noted on AVS.

## 2021-11-18 NOTE — Assessment & Plan Note (Addendum)
Wt Readings from Last 3 Encounters:  11/18/21 167 lb (75.8 kg)  10/29/21 169 lb (76.7 kg)  09/24/21 167 lb (75.8 kg)   She is currently on furosemide 80mg  PO once daily in the AM. Advised OK take 1/2 tab once daily in the afternoon today and tomorrow then return to 80mg  once daily in the AM.  Limit sodium, elevate as able, consider compression knee high socks.

## 2021-11-18 NOTE — Progress Notes (Signed)
Subjective:   By signing my name below, I, Michele Williamson, attest that this documentation has been prepared under the direction and in the presence of [name], NP 11/18/2021   Patient ID: Michele Williamson, female    DOB: [birth year], 81 y.o.   MRN: [id number]  Chief Complaint  Patient presents with   Foot Swelling    Patient complains of bilateral foot swelling   Sciatica    Patient complains of pain running down left leg   Arthritis    Complains of of constant joint pain "arthritis pain"    HPI Patient is in today for an office visit   Swollen Feet: She complains of feet swelling. She is currently taking 80 Mg of Furosemide. She states that the manufacturer of the medication has recently changed. She contributes her weight loss to her minimal eating.  Wt Readings from Last 3 Encounters:  11/18/21 167 lb (75.8 kg)  10/29/21 169 lb (76.7 kg)  09/24/21 167 lb (75.8 kg)   Feet Pain: Her gabapentin medication was increased to 300 Mg. She states that the medication is alleviating some of her symptoms.  Sciatica: She complains of sciatic pain that is not constant but is present during today's visit. She states that Tylenol does not alleviate her symptoms but Alleve does.  Immunizations: She is interested in receiving an influenza vaccine during today's visit  Health Maintenance Due  Topic Date Due   INFLUENZA VACCINE  10/01/2021   COVID-19 Vaccine (7 - Moderna series) 11/10/2021    Past Medical History:  Diagnosis Date   Arthritis    shoulder, hands , feet, knees,   Complication of anesthesia    Heart murmur    Hyperglycemia    Hyperlipemia    Hypertension    PONV (postoperative nausea and vomiting)     Past Surgical History:  Procedure Laterality Date   BUNIONECTOMY Right    CARPAL TUNNEL RELEASE     CATARACT EXTRACTION     CESAREAN SECTION     CHOLECYSTECTOMY  06/13/99   REVERSE SHOULDER ARTHROPLASTY Left 06/28/2020   Procedure: REVERSE SHOULDER  ARTHROPLASTY;  Surgeon: Justice Britain, MD;  Location: [facility];  Service: Orthopedics;  Laterality: Left;  160min   ROTATOR CUFF REPAIR Right     Family History  Problem Relation Age of Onset   Hypertension Mother    Heart disease Mother    Bone cancer Father    Hypertension Father    Hyperlipidemia Father    Melanoma Brother    Stomach cancer Maternal Grandmother    Obesity Son     Social History   Socioeconomic History   Marital status: Widowed    Spouse name: Not on file   Number of children: Not on file   Years of education: Not on file   Highest education level: Not on file  Occupational History   Not on file  Tobacco Use   Smoking status: Never   Smokeless tobacco: Never  Vaping Use   Vaping Use: Never used  Substance and Sexual Activity   Alcohol use: No   Drug use: No   Sexual activity: Yes    Birth control/protection: None  Other Topics Concern   Not on file  Social History Narrative   Lives in [place]   Has an adopted son born Jun 12, 2008   Husband died in 06/13/2006   Retired, worked as an Agricultural consultant   [facility]   Has a Freight forwarder   Completed HS  Social Determinants of Health   Financial Resource Strain: Not on file  Food Insecurity: Not on file  Transportation Needs: Not on file  Physical Activity: Inactive (03/06/2020)   Exercise Vital Sign    Days of Exercise per Week: 0 days    Minutes of Exercise per Session: 0 min  Stress: Not on file  Social Connections: Not on file  Intimate Partner Violence: Not on file    Outpatient Medications Prior to Visit  Medication Sig Dispense Refill   alendronate (FOSAMAX) 70 MG tablet Take 1 tablet (70 mg total) by mouth every 7 (seven) days. Take with a full glass of water on an empty stomach. 12 tablet 3   aspirin EC 81 MG tablet Take 1 tablet (81 mg total) by mouth daily. Swallow whole. 90 tablet 3   CALCIUM CARBONATE-VITAMIN D PO Take 1 tablet by mouth daily. 600-400 mg     diclofenac Sodium  (VOLTAREN) 1 % GEL Apply 2 g topically 4 (four) times daily as needed. 100 g 0   furosemide (LASIX) 80 MG tablet TAKE 1 TABLET BY MOUTH DAILY 90 tablet 1   gabapentin (NEURONTIN) 300 MG capsule Take 1 capsule (300 mg total) by mouth 3 (three) times daily. 90 capsule 3   loratadine (CLARITIN) 10 MG tablet Take 10 mg by mouth daily. [employer]     Multiple Vitamins-Minerals (MULTIVITAMIN WITH MINERALS) tablet Take 1 tablet by mouth daily.     omeprazole (PRILOSEC) 20 MG capsule Take 1 capsule (20 mg total) by mouth daily as needed. 90 capsule 1   rosuvastatin (CRESTOR) 20 MG tablet TAKE 1 TABLET BY MOUTH AT  BEDTIME 90 tablet 1   No facility-administered medications prior to visit.    Allergies  Allergen Reactions   Oxycodone-Acetaminophen Nausea Only   Penicillins     Childhood Pt received Ancef on 06-28-20 and tolerated it well   Pregabalin     Dizziness   Tramadol Nausea And Vomiting    Review of Systems  Musculoskeletal:        (+) Feet Swelling (+) Sciatica       Objective:    Physical Exam Constitutional:      General: She is not in acute distress.    Appearance: Normal appearance. She is not ill-appearing.  HENT:     Head: Normocephalic and atraumatic.     Right Ear: External ear normal.     Left Ear: External ear normal.  Eyes:     Extraocular Movements: Extraocular movements intact.     Pupils: Pupils are equal, round, and reactive to light.  Cardiovascular:     Rate and Rhythm: Normal rate and regular rhythm.     Heart sounds: Normal heart sounds. No murmur heard.    No gallop.  Pulmonary:     Effort: Pulmonary effort is normal. No respiratory distress.     Breath sounds: Normal breath sounds. No wheezing or rales.  Musculoskeletal:     Right lower leg: 2+ Edema present.     Left lower leg: 2+ Edema present.  Skin:    General: Skin is warm and dry.  Neurological:     Mental Status: She is alert and oriented to person, place, and time.  Psychiatric:         Mood and Affect: Mood normal.        Behavior: Behavior normal.        Judgment: Judgment normal.     BP (!) 139/54   Pulse 75  Temp 98 F (36.7 C) (Oral)   Resp 16   Wt 167 lb (75.8 kg)   SpO2 97%   BMI 29.58 kg/m  Wt Readings from Last 3 Encounters:  11/18/21 167 lb (75.8 kg)  10/29/21 169 lb (76.7 kg)  09/24/21 167 lb (75.8 kg)       Assessment & Plan:   Problem List Items Addressed This Visit       Unprioritized   Sciatica of left side    New. Trial of meloxicam and low back exercise as noted on AVS.      Peripheral neuropathy    Symptoms are improved on increased dose of gabapentin. Continue same.       Lower extremity edema    Wt Readings from Last 3 Encounters:  11/18/21 167 lb (75.8 kg)  10/29/21 169 lb (76.7 kg)  09/24/21 167 lb (75.8 kg)  She is currently on furosemide 80mg  PO once daily in the AM. Advised OK take 1/2 tab once daily in the afternoon today and tomorrow then return to 80mg  once daily in the AM.  Limit sodium, elevate as able, consider compression knee high socks.       Other Visit Diagnoses     Needs flu shot    -  Primary   Relevant Orders   Flu Vaccine QUAD High Dose(Fluad)      Meds ordered this encounter  Medications   meloxicam (MOBIC) 7.5 MG tablet    Sig: Take 1 tablet (7.5 mg total) by mouth daily.    Dispense:  14 tablet    Refill:  0    Order Specific Question:   Supervising Provider    Answer:   Penni Homans A [[id number]]    I, Nance Pear, NP, personally preformed the services described in this documentation.  All medical record entries made by the scribe were at my direction and in my presence.  I have reviewed the chart and discharge instructions (if applicable) and agree that the record reflects my personal performance and is accurate and complete. 11/18/2021   I,Amber Collins,acting as a Education administrator for Nance Pear, NP.,have documented all relevant documentation on the behalf of Nance Pear, NP,as directed by  Nance Pear, NP while in the presence of Nance Pear, NP.    Nance Pear, NP

## 2021-11-25 ENCOUNTER — Encounter: Payer: Self-pay | Admitting: Family

## 2021-11-26 ENCOUNTER — Encounter: Payer: Self-pay | Admitting: Family

## 2021-11-26 ENCOUNTER — Other Ambulatory Visit: Payer: Self-pay | Admitting: Family

## 2021-11-26 MED ORDER — MELOXICAM 7.5 MG PO TABS: 7.5000 mg | ORAL_TABLET | Freq: Every day | ORAL | 0 refills | Status: DC

## 2021-11-26 MED ORDER — MELOXICAM 7.5 MG PO TABS
7.5000 mg | ORAL_TABLET | Freq: Every day | ORAL | 0 refills | Status: DC
Start: 2021-11-26 — End: 2022-01-28

## 2021-12-04 ENCOUNTER — Other Ambulatory Visit: Payer: Self-pay | Admitting: Family

## 2021-12-05 ENCOUNTER — Other Ambulatory Visit: Payer: Self-pay | Admitting: Family

## 2021-12-26 ENCOUNTER — Encounter: Payer: Self-pay | Admitting: Family

## 2022-01-06 ENCOUNTER — Other Ambulatory Visit: Payer: Self-pay | Admitting: Family

## 2022-01-22 DIAGNOSIS — M79642 Pain in left hand: Secondary | ICD-10-CM | POA: Insufficient documentation

## 2022-01-22 DIAGNOSIS — M13842 Other specified arthritis, left hand: Secondary | ICD-10-CM | POA: Diagnosis not present

## 2022-01-28 ENCOUNTER — Other Ambulatory Visit: Payer: Self-pay | Admitting: Family

## 2022-02-12 ENCOUNTER — Other Ambulatory Visit: Payer: Self-pay | Admitting: Family

## 2022-02-13 ENCOUNTER — Other Ambulatory Visit: Payer: Self-pay | Admitting: Family

## 2022-02-21 ENCOUNTER — Encounter: Payer: Self-pay | Admitting: Family Medicine

## 2022-02-21 ENCOUNTER — Ambulatory Visit (INDEPENDENT_AMBULATORY_CARE_PROVIDER_SITE_OTHER): Payer: Medicare Other | Admitting: Family Medicine

## 2022-02-21 VITALS — BP 133/51 | HR 88 | Temp 98.1°F | Resp 16 | Wt 173.0 lb

## 2022-02-21 DIAGNOSIS — H6123 Impacted cerumen, bilateral: Secondary | ICD-10-CM | POA: Diagnosis not present

## 2022-02-21 DIAGNOSIS — M79645 Pain in left finger(s): Secondary | ICD-10-CM

## 2022-02-21 NOTE — Progress Notes (Signed)
Acute Office Visit  Subjective:     Patient ID: Michele Williamson, female    DOB: 1940-11-22, 81 y.o.   MRN: [id number]  Chief Complaint  Patient presents with   wax build up    Both ears    pain left thumb    HPI Patient is in today for cerumen impaction and thumb pain.  Cerumen impaction: She is reporting wax build up in both ears (right worse than left). She went to get her hearing aids adjusted and they told her she had a lot of wax. She is having some muffled hearing, but no pain or fevers.  Left thumb pain: She went to [facility] a few months ago after pinching her thumb in the toilet seat. Xrays showed there was arthritis and he offered fusion, but she wasn't sure she wanted to go through that. The pain is constant all down the inside of her thumb.  She would rather not go back to them right now, but she is interested in seeing sports medicine in our building first to see if injections would be a possibility.  She has not gotten any relief with Voltaren gel.       ROS All review of systems negative except what is listed in the HPI      Objective:    BP (!) 133/51   Pulse 88   Temp 98.1 F (36.7 C)   Resp 16   Wt 173 lb (78.5 kg)   SpO2 96%   BMI 30.65 kg/m    Physical Exam Vitals reviewed.  Constitutional:      Appearance: Normal appearance.  HENT:     Right Ear: There is impacted cerumen.     Left Ear: There is impacted cerumen.  Musculoskeletal:        General: No swelling or tenderness. Normal range of motion.  Skin:    General: Skin is warm and dry.  Neurological:     General: No focal deficit present.     Mental Status: She is alert. Mental status is at baseline.  Psychiatric:        Mood and Affect: Mood normal.        Behavior: Behavior normal.        Thought Content: Thought content normal.        Judgment: Judgment normal.     No results found for any visits on 02/21/22.      Assessment & Plan:   Problem List Items Addressed  This Visit   None Visit Diagnoses     Pain of left thumb    -  Primary   Relevant Orders   Ambulatory referral to Sports Medicine   Bilateral impacted cerumen     Indication: Cerumen impaction of the ear(s) bilaterally  Medical necessity statement: On physical examination, cerumen impairs clinically significant portions of the external auditory canal, and tympanic membrane. Noted obstructive, copious cerumen that cannot be removed without magnification and instrumentations requiring professional removal.   Consent: Discussed benefits and risks of procedure and verbal consent obtained  Procedure: Patient was prepped for the procedure. Otoscope utilized to assess and take note of the ear canal, the tympanic membrane, and the presence, amount, and placement of the cerumen.  Gentle irrigation with water at body temperature and soft plastic curette utilized to remove impacted cerumen.  Excess water drained by gravity and ear canal(s) dried with clean guaze.  Post procedure examination: Otoscopic examination reveals complete cerumen removal with no damage to the  auditory canal, tympanic membrane, or surrounding tissue of right ear, but left canal continues with significant amount of dried cerumen - recommended Debrox drops at home and follow-up if not improving so we can attempt again. Patient tolerated procedure well.   Post procedure instructions: Patient made aware that they may experience temporary vertigo, temporary changes in hearing, and temporary discomfort. If these symptom last for more than 24 hours to call the clinic or proceed to the ED for further evaluation. Discussed avoiding placing objects into the ear canal for cleaning.         No orders of the defined types were placed in this encounter.   Return if symptoms worsen or fail to improve.  Clayborne Dana, NP

## 2022-03-10 ENCOUNTER — Ambulatory Visit: Payer: Medicare HMO | Admitting: Family Medicine

## 2022-03-10 ENCOUNTER — Encounter: Payer: Self-pay | Admitting: Family Medicine

## 2022-03-10 ENCOUNTER — Ambulatory Visit: Payer: Self-pay

## 2022-03-10 VITALS — BP 136/78 | Ht 63.0 in | Wt 160.0 lb

## 2022-03-10 DIAGNOSIS — M1812 Unilateral primary osteoarthritis of first carpometacarpal joint, left hand: Secondary | ICD-10-CM

## 2022-03-10 DIAGNOSIS — M25542 Pain in joints of left hand: Secondary | ICD-10-CM

## 2022-03-10 HISTORY — DX: Pain in joints of left hand: M25.542

## 2022-03-10 MED ORDER — TRIAMCINOLONE ACETONIDE 40 MG/ML IJ SUSP
20.0000 mg | Freq: Once | INTRAMUSCULAR | Status: AC
  Administered 2022-03-10: 20 mg via INTRA_ARTICULAR

## 2022-03-10 NOTE — Assessment & Plan Note (Signed)
Acutely occurring. Having degenerative changes in the joint with hyperemia around joint.  - counseled on home exercise therapy and supportive care - injection today  - could consider OT or brace.

## 2022-03-10 NOTE — Patient Instructions (Signed)
Nice to meet you Please use ice as needed   Please send me a message in MyChart with any questions or updates.  Please see me back in 4 weeks.   --Dr. Raeford Razor

## 2022-03-10 NOTE — Progress Notes (Signed)
  Michele Williamson - 83 y.o. female MRN [id number]  Date of birth: 1940/12/19  SUBJECTIVE:  Including CC & ROS.  No chief complaint on file.   Michele CHELSYE SUHRE is a 82 y.o. female that is  presenting with left thumb pain. Ongoing for 2 months. Pain is on the MP joint of the left thumb. No injury and no history of surgery.    Review of Systems See HPI   HISTORY: Past Medical, Surgical, Social, and Family History Reviewed & Updated per EMR.   Pertinent Historical Findings include:  Past Medical History:  Diagnosis Date   Arthritis    shoulder, hands , feet, knees,   Complication of anesthesia    Heart murmur    Hyperglycemia    Hyperlipemia    Hypertension    PONV (postoperative nausea and vomiting)     Past Surgical History:  Procedure Laterality Date   BUNIONECTOMY Right    CARPAL TUNNEL RELEASE     CATARACT EXTRACTION     CESAREAN SECTION     CHOLECYSTECTOMY  2001   REVERSE SHOULDER ARTHROPLASTY Left 06/28/2020   Procedure: REVERSE SHOULDER ARTHROPLASTY;  Surgeon: Justice Britain, MD;  Location: [facility];  Service: Orthopedics;  Laterality: Left;  165min   ROTATOR CUFF REPAIR Right      PHYSICAL EXAM:  VS: BP 136/78   Ht 5\' 3"  (1.6 m)   Wt 160 lb (72.6 kg)   BMI 28.34 kg/m  Physical Exam Gen: NAD, alert, cooperative with exam, well-appearing MSK:  Neurovascularly intact     Aspiration/Injection Procedure Note Michele M Folts April 02, 1940  Procedure: Injection Indications: left thumb pain  Procedure Details Consent: Risks of procedure as well as the alternatives and risks of each were explained to the (patient/caregiver).  Consent for procedure obtained. Time Out: Verified patient identification, verified procedure, site/side was marked, verified correct patient position, special equipment/implants available, medications/allergies/relevent history reviewed, required imaging and test results available.  Performed.  The area was cleaned with iodine and alcohol  swabs.    The left metacarpophalangeal joint was injected with 0.5 cc of 40 mg kenalog and 0.5 cc's of 0.25% bupivacaine with with 25 gauge 1 1/2".  Ultrasound was used. Images were obtained in short views showing the injection.     A sterile dressing was applied.  Patient did tolerate procedure well.     ASSESSMENT & PLAN:   Metacarpophalangeal joint pain, left Acutely occurring. Having degenerative changes in the joint with hyperemia around joint.  - counseled on home exercise therapy and supportive care - injection today  - could consider OT or brace.

## 2022-03-18 ENCOUNTER — Telehealth: Payer: Self-pay | Admitting: *Deleted

## 2022-03-18 NOTE — Telephone Encounter (Signed)
LMOM for pt to schedule AWV. 

## 2022-03-19 ENCOUNTER — Ambulatory Visit (INDEPENDENT_AMBULATORY_CARE_PROVIDER_SITE_OTHER): Payer: Medicare HMO | Admitting: *Deleted

## 2022-03-19 DIAGNOSIS — Z0000 Encounter for general adult medical examination without abnormal findings: Secondary | ICD-10-CM

## 2022-03-19 NOTE — Progress Notes (Signed)
Subjective:   Michele Williamson is a 82 y.o. female who presents for Medicare Annual (Subsequent) preventive examination.  I connected with  Michele M Speirs on 03/19/22 by a audio enabled telemedicine application and verified that I am speaking with the correct person using two identifiers.  Patient Location: Home  Provider Location: Office/Clinic  I discussed the limitations of evaluation and management by telemedicine. The patient expressed understanding and agreed to proceed.   Review of Systems    Defer to PCP Cardiac Risk Factors include: advanced age (>20men, >30 women);dyslipidemia;hypertension     Objective:    There were no vitals filed for this visit. There is no height or weight on file to calculate BMI.     03/19/2022    8:24 AM 08/25/2021    8:47 PM 06/25/2020    9:23 AM 02/22/2014    7:18 PM  Advanced Directives  Does Patient Have a Medical Advance Directive? Yes No Yes No  Type of Paramedic of [place];Living will  [place];Living will   Does patient want to make changes to medical advance directive? No - Patient declined     Copy of [place] in Chart? No - copy requested     Would patient like information on creating a medical advance directive?  No - Patient declined  No - patient declined information    Current Medications (verified) Outpatient Encounter Medications as of 03/19/2022  Medication Sig   alendronate (FOSAMAX) 70 MG tablet Take 1 tablet (70 mg total) by mouth every 7 (seven) days. Take with a full glass of water on an empty stomach.   aspirin EC 81 MG tablet Take 1 tablet (81 mg total) by mouth daily. Swallow whole.   CALCIUM CARBONATE-VITAMIN D PO Take 1 tablet by mouth daily. 600-400 mg   diclofenac Sodium (VOLTAREN) 1 % GEL Apply 2 g topically 4 (four) times daily as needed.   furosemide (LASIX) 80 MG tablet Take 1 tablet (80 mg total) by mouth daily.   gabapentin (NEURONTIN)  300 MG capsule TAKE 1 CAPSULE BY MOUTH 3 TIMES  DAILY   loratadine (CLARITIN) 10 MG tablet Take 10 mg by mouth daily. [employer]   meloxicam (MOBIC) 7.5 MG tablet TAKE 1 TABLET BY MOUTH DAILY   Multiple Vitamins-Minerals (MULTIVITAMIN WITH MINERALS) tablet Take 1 tablet by mouth daily.   rosuvastatin (CRESTOR) 20 MG tablet Take 1 tablet (20 mg total) by mouth at bedtime.   No facility-administered encounter medications on file as of 03/19/2022.    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   Procedure: REVERSE SHOULDER ARTHROPLASTY;  Surgeon: Justice Britain, MD;  Location: [facility];  Service: Orthopedics;  Laterality: Left;  112min   ROTATOR CUFF REPAIR Right    Family History  Problem Relation Age of [place]set   Hypertension Mother    Heart disease Mother    Bone cancer Father  Hypertension Father    Hyperlipidemia Father    Melanoma Brother    Stomach cancer Maternal Grandmother    Obesity Son    Social History   Socioeconomic History   Marital status: Widowed    Spouse name: Not on file   Number of children: Not on file   Years of education: Not on file   Highest education level: Not on file  Occupational History   Not on file  Tobacco Use   Smoking status: Never   Smokeless tobacco: Never  Vaping Use   Vaping Use: Never used  Substance and Sexual Activity   Alcohol use: No   Drug use: No   Sexual activity: Yes    Birth control/protection: None  Other Topics Concern    Not on file  Social History Narrative   Lives in [place]   Has an adopted son born 2008/06/22   Husband died in June 23, 2006   Retired, worked as an Insurance underwriter   Enjoys senior center   Has a Veterinary surgeon   Completed HS   Social Determinants of Corporate investment banker Strain: Low Risk  (03/19/2022)   Overall Financial Resource Strain (CARDIA)    Difficulty of Paying Living Expenses: Not hard at all  Food Insecurity: No Food Insecurity (03/19/2022)   Hunger Vital Sign    Worried About Running Out of Food in the Last Year: Never true    Ran Out of Food in the Last Year: Never true  Transportation Needs: No Transportation Needs (03/19/2022)   PRAPARE - Administrator, Civil Service (Medical): No    Lack of Transportation (Non-Medical): No  Physical Activity: Inactive (03/06/2020)   Exercise Vital Sign    Days of Exercise per Week: 0 days    Minutes of Exercise per Session: 0 min  Stress: No Stress Concern Present (03/19/2022)   [employer] of Occupational Health - Occupational Stress Questionnaire    Feeling of Stress : Not at all  Social Connections: Moderately Integrated (03/19/2022)   Social Connection and Isolation Panel [NHANES]    Frequency of Communication with Friends and Family: More than three times a week    Frequency of Social Gatherings with Friends and Family: Three times a week    Attends Religious Services: More than 4 times per year    Active Member of Clubs or Organizations: Yes    Attends Banker Meetings: More than 4 times per year    Marital Status: Widowed    Tobacco Counseling Counseling given: Not Answered   Clinical Intake:  Pre-visit preparation completed: Yes  Pain : No/denies pain  Diabetes: No  How often do you need to have someone help you when you read instructions, pamphlets, or other written materials from your doctor or pharmacy?: 1 - Never  Activities of Daily Living    03/19/2022    8:34 AM  In your present  state of health, do you have any difficulty performing the following activities:  Hearing? 1  Vision? 0  Difficulty concentrating or making decisions? 0  Walking or climbing stairs? 0  Dressing or bathing? 0  Doing errands, shopping? 0  Preparing Food and eating ? N  Using the Toilet? N  In the past six months, have you accidently leaked urine? Y  Do you have problems with loss of bowel control? N  Managing your Medications? N  Managing your Finances? N  Housekeeping or managing your Housekeeping? N    Patient Care Team:  [name], NP as PCP - General (Internal Medicine)  Indicate any recent Medical Services you may have received from other than [facility] providers in the past year (date may be approximate).     Assessment:   This is a routine wellness examination for Michele.  Hearing/Vision screen No results found.  Dietary issues and exercise activities discussed: Current Exercise Habits: The patient does not participate in regular exercise at present, Exercise limited by: None identified   Goals Addressed   None    Depression Screen    03/19/2022    8:33 AM 08/30/2021   10:16 AM 03/06/2020    8:52 AM  PHQ 2/9 Scores  PHQ - 2 Score 0 0 2  PHQ- 9 Score   4    Fall Risk    03/19/2022    8:25 AM 08/30/2021   10:17 AM  Fall Risk   Falls in the past year? 0 0  Number falls in past yr: 0 0  Injury with Fall? 0   Risk for fall due to : No Fall Risks   Follow up Falls evaluation completed     [place]:  Any stairs in or around the home? Yes  If so, are there any without handrails? No  Home free of loose throw rugs in walkways, pet beds, electrical cords, etc? Yes  Adequate lighting in your home to reduce risk of falls? Yes   ASSISTIVE DEVICES UTILIZED TO PREVENT FALLS:  Life alert? No  Use of a cane, walker or w/c?  Cane sometimes Grab bars in the bathroom? Yes  Shower chair or bench in shower? Yes  Elevated toilet  seat or a handicapped toilet? No   TIMED UP AND GO:  Was the test performed?  No, audio visit .    Cognitive Function:        03/19/2022    8:38 AM  6CIT Screen  What Year? 0 points  What month? 0 points  What time? 0 points  Count back from 20 0 points  Months in reverse 0 points  Repeat phrase 0 points  Total Score 0 points    Immunizations Immunization History  Administered Date(s) Administered   Fluad Quad(high Dose 65+) 11/18/2021   Influenza, High Dose Seasonal PF 12/06/2014, 11/30/2017   Influenza-Unspecified 11/29/2015   Moderna Covid-19 Vaccine Bivalent Booster 52yrs & up 07/10/2021   Moderna SARS-COV2 Booster Vaccination 12/28/2019   Moderna Sars-Covid-2 Vaccination 03/15/2019, 04/12/2019, 12/28/2019, 06/13/2020, 11/19/2020   PPD Test 05/16/2008   Pneumococcal Conjugate-13 12/06/2014, 11/24/2016   Pneumococcal Polysaccharide-23 11/15/2007   Tdap 10/29/2005, 11/09/2020   Zoster Recombinat (Shingrix) 11/27/2016, 02/02/2017   Zoster, Live 03/20/2008    TDAP status: Up to date  Flu Vaccine status: Up to date  Pneumococcal vaccine status: Up to date  Covid-19 vaccine status: Information provided on how to obtain vaccines.   Qualifies for Shingles Vaccine? Yes   Zostavax completed Yes   Shingrix Completed?: Yes  Screening Tests Health Maintenance  Topic Date Due   Medicare Annual Wellness (AWV)  11/23/2020   COVID-19 Vaccine (8 - 2023-24 season) 11/01/2021   DTaP/Tdap/Td (3 - Td or Tdap) 11/10/2030   Pneumonia Vaccine 60+ Years old  Completed   INFLUENZA VACCINE  Completed   DEXA SCAN  Completed   Zoster Vaccines- Shingrix  Completed   HPV VACCINES  Aged Out   Hepatitis C Screening  Discontinued    Health Maintenance  Health Maintenance Due  Topic Date Due  Medicare Annual Wellness (AWV)  11/23/2020   COVID-19 Vaccine (8 - 2023-24 season) 11/01/2021    Colorectal cancer screening: No longer required.   Mammogram status: Completed  05/20/21. Repeat every year  Bone Density status: Completed 05/20/21. Results reflect: Bone density results: OSTEOPOROSIS. Repeat every 2 years.  Lung Cancer Screening: (Low Dose CT Chest recommended if Age 33-80 years, 30 pack-year currently smoking OR have quit w/in 15years.) does not qualify.   Additional Screening:  Hepatitis C Screening: does not qualify  Vision Screening: Recommended annual ophthalmology exams for early detection of glaucoma and other disorders of the eye. Is the patient up to date with their annual eye exam?  Yes  Who is the provider or what is the name of the office in which the patient attends annual eye exams? Dr. Hazle Quant If pt is not established with a provider, would they like to be referred to a provider to establish care? No .   Dental Screening: Recommended annual dental exams for proper oral hygiene  Community Resource Referral / Chronic Care Management: CRR required this visit?  No   CCM required this visit?  No      Plan:     I have personally reviewed and noted the following in the patient's chart:   Medical and social history Use of alcohol, tobacco or illicit drugs  Current medications and supplements including opioid prescriptions. Patient is not currently taking opioid prescriptions. Functional ability and status Nutritional status Physical activity Advanced directives List of other physicians Hospitalizations, surgeries, and ER visits in previous 12 months Vitals Screenings to include cognitive, depression, and falls Referrals and appointments  In addition, I have reviewed and discussed with patient certain preventive protocols, quality metrics, and best practice recommendations. A written personalized care plan for preventive services as well as general preventive health recommendations were provided to patient.   Due to this being a telephonic visit, the after visit summary with patients personalized plan was offered to patient via  mail or my-chart. Patient would like to access on my-chart.   Donne Anon, [place]   03/19/2022   Nurse Notes: None

## 2022-03-19 NOTE — Patient Instructions (Signed)
[name] , Thank you for taking time to come for your Medicare Wellness Visit. I appreciate your ongoing commitment to your health goals. Please review the following plan we discussed and let me know if I can assist you in the future.   These are the goals we discussed:  Goals   None     This is a list of the screening recommended for you and due dates:  Health Maintenance  Topic Date Due   COVID-19 Vaccine (8 - 2023-24 season) 11/01/2021   Medicare Annual Wellness Visit  03/20/2023   DTaP/Tdap/Td vaccine (3 - Td or Tdap) 11/10/2030   Pneumonia Vaccine  Completed   Flu Shot  Completed   DEXA scan (bone density measurement)  Completed   Zoster (Shingles) Vaccine  Completed   HPV Vaccine  Aged Out   Hepatitis C Screening: USPSTF Recommendation to screen - Ages 5-79 yo.  Discontinued     Next appointment: Follow up in one year for your annual wellness visit.   Preventive Care 24 Years and Older, Female Preventive care refers to lifestyle choices and visits with your health care provider that can promote health and wellness. What does preventive care include? A yearly physical exam. This is also called an annual well check. Dental exams once or twice a year. Routine eye exams. Ask your health care provider how often you should have your eyes checked. Personal lifestyle choices, including: Daily care of your teeth and gums. Regular physical activity. Eating a healthy diet. Avoiding tobacco and drug use. Limiting alcohol use. Practicing safe sex. Taking low-dose aspirin every day. Taking vitamin and mineral supplements as recommended by your health care provider. What happens during an annual well check? The services and screenings done by your health care provider during your annual well check will depend on your age, overall health, lifestyle risk factors, and family history of disease. Counseling  Your health care provider may ask you questions about your: Alcohol  use. Tobacco use. Drug use. Emotional well-being. Home and relationship well-being. Sexual activity. Eating habits. History of falls. Memory and ability to understand (cognition). Work and work Statistician. Reproductive health. Screening  You may have the following tests or measurements: Height, weight, and BMI. Blood pressure. Lipid and cholesterol levels. These may be checked every 5 years, or more frequently if you are over 32 years old. Skin check. Lung cancer screening. You may have this screening every year starting at age 67 if you have a 30-pack-year history of smoking and currently smoke or have quit within the past 15 years. Fecal occult blood test (FOBT) of the stool. You may have this test every year starting at age 41. Flexible sigmoidoscopy or colonoscopy. You may have a sigmoidoscopy every 5 years or a colonoscopy every 10 years starting at age 94. Hepatitis C blood test. Hepatitis B blood test. Sexually transmitted disease (STD) testing. Diabetes screening. This is done by checking your blood sugar (glucose) after you have not eaten for a while (fasting). You may have this done every 1-3 years. Bone density scan. This is done to screen for osteoporosis. You may have this done starting at age 63. Mammogram. This may be done every 1-2 years. Talk to your health care provider about how often you should have regular mammograms. Talk with your health care provider about your test results, treatment options, and if necessary, the need for more tests. Vaccines  Your health care provider may recommend certain vaccines, such as: Influenza vaccine. This is recommended  every year. Tetanus, diphtheria, and acellular pertussis (Tdap, Td) vaccine. You may need a Td booster every 10 years. Zoster vaccine. You may need this after age 75. Pneumococcal 13-valent conjugate (PCV13) vaccine. One dose is recommended after age 9. Pneumococcal polysaccharide (PPSV23) vaccine. One dose is  recommended after age 61. Talk to your health care provider about which screenings and vaccines you need and how often you need them. This information is not intended to replace advice given to you by your health care provider. Make sure you discuss any questions you have with your health care provider. Document Released: 03/16/2015 Document Revised: 11/07/2015 Document Reviewed: 12/19/2014 Elsevier Interactive Patient Education  2017 [place] Prevention in the Home Falls can cause injuries. They can happen to people of all ages. There are many things you can do to make your home safe and to help prevent falls. What can I do on the outside of my home? Regularly fix the edges of walkways and driveways and fix any cracks. Remove anything that might make you trip as you walk through a door, such as a raised step or threshold. Trim any bushes or trees on the path to your home. Use bright outdoor lighting. Clear any walking paths of anything that might make someone trip, such as rocks or tools. Regularly check to see if handrails are loose or broken. Make sure that both sides of any steps have handrails. Any raised decks and porches should have guardrails on the edges. Have any leaves, snow, or ice cleared regularly. Use sand or salt on walking paths during winter. Clean up any spills in your garage right away. This includes oil or grease spills. What can I do in the bathroom? Use night lights. Install grab bars by the toilet and in the tub and shower. Do not use towel bars as grab bars. Use non-skid mats or decals in the tub or shower. If you need to sit down in the shower, use a plastic, non-slip stool. Keep the floor dry. Clean up any water that spills on the floor as soon as it happens. Remove soap buildup in the tub or shower regularly. Attach bath mats securely with double-sided non-slip rug tape. Do not have throw rugs and other things on the floor that can make you  trip. What can I do in the bedroom? Use night lights. Make sure that you have a light by your bed that is easy to reach. Do not use any sheets or blankets that are too big for your bed. They should not hang down onto the floor. Have a firm chair that has side arms. You can use this for support while you get dressed. Do not have throw rugs and other things on the floor that can make you trip. What can I do in the kitchen? Clean up any spills right away. Avoid walking on wet floors. Keep items that you use a lot in easy-to-reach places. If you need to reach something above you, use a strong step stool that has a grab bar. Keep electrical cords out of the way. Do not use floor polish or wax that makes floors slippery. If you must use wax, use non-skid floor wax. Do not have throw rugs and other things on the floor that can make you trip. What can I do with my stairs? Do not leave any items on the stairs. Make sure that there are handrails on both sides of the stairs and use them. Fix handrails that are broken  or loose. Make sure that handrails are as long as the stairways. Check any carpeting to make sure that it is firmly attached to the stairs. Fix any carpet that is loose or worn. Avoid having throw rugs at the top or bottom of the stairs. If you do have throw rugs, attach them to the floor with carpet tape. Make sure that you have a light switch at the top of the stairs and the bottom of the stairs. If you do not have them, ask someone to add them for you. What else can I do to help prevent falls? Wear shoes that: Do not have high heels. Have rubber bottoms. Are comfortable and fit you well. Are closed at the toe. Do not wear sandals. If you use a stepladder: Make sure that it is fully opened. Do not climb a closed stepladder. Make sure that both sides of the stepladder are locked into place. Ask someone to hold it for you, if possible. Clearly mark and make sure that you can  see: Any grab bars or handrails. First and last steps. Where the edge of each step is. Use tools that help you move around (mobility aids) if they are needed. These include: Canes. Walkers. Scooters. Crutches. Turn on the lights when you go into a dark area. Replace any light bulbs as soon as they burn out. Set up your furniture so you have a clear path. Avoid moving your furniture around. If any of your floors are uneven, fix them. If there are any pets around you, be aware of where they are. Review your medicines with your doctor. Some medicines can make you feel dizzy. This can increase your chance of falling. Ask your doctor what other things that you can do to help prevent falls. This information is not intended to replace advice given to you by your health care provider. Make sure you discuss any questions you have with your health care provider. Document Released: 12/14/2008 Document Revised: 07/26/2015 Document Reviewed: 03/24/2014 Elsevier Interactive Patient Education  2017 [employer].

## 2022-04-10 ENCOUNTER — Ambulatory Visit: Payer: Medicare HMO | Admitting: Family Medicine

## 2022-04-30 ENCOUNTER — Ambulatory Visit (INDEPENDENT_AMBULATORY_CARE_PROVIDER_SITE_OTHER): Payer: Medicare HMO | Admitting: Family

## 2022-04-30 VITALS — BP 141/58 | HR 86 | Temp 98.6°F | Resp 16 | Wt 172.0 lb

## 2022-04-30 DIAGNOSIS — G6289 Other specified polyneuropathies: Secondary | ICD-10-CM | POA: Diagnosis not present

## 2022-04-30 DIAGNOSIS — E785 Hyperlipidemia, unspecified: Secondary | ICD-10-CM | POA: Diagnosis not present

## 2022-04-30 DIAGNOSIS — M1990 Unspecified osteoarthritis, unspecified site: Secondary | ICD-10-CM

## 2022-04-30 DIAGNOSIS — I10 Essential (primary) hypertension: Secondary | ICD-10-CM

## 2022-04-30 DIAGNOSIS — R739 Hyperglycemia, unspecified: Secondary | ICD-10-CM

## 2022-04-30 DIAGNOSIS — R5383 Other fatigue: Secondary | ICD-10-CM

## 2022-04-30 DIAGNOSIS — D509 Iron deficiency anemia, unspecified: Secondary | ICD-10-CM | POA: Diagnosis not present

## 2022-04-30 HISTORY — DX: Other fatigue: R53.83

## 2022-04-30 LAB — CBC WITH DIFFERENTIAL/PLATELET
Basophils Absolute: 0 10*3/uL (ref 0.0–0.1)
Basophils Relative: 0.5 % (ref 0.0–3.0)
Eosinophils Absolute: 0.3 10*3/uL (ref 0.0–0.7)
Eosinophils Relative: 2.9 % (ref 0.0–5.0)
HCT: 38.2 % (ref 36.0–46.0)
Hemoglobin: 12.5 g/dL (ref 12.0–15.0)
Lymphocytes Relative: 21.4 % (ref 12.0–46.0)
Lymphs Abs: 2 10*3/uL (ref 0.7–4.0)
MCHC: 32.8 g/dL (ref 30.0–36.0)
MCV: 89.9 fl (ref 78.0–100.0)
Monocytes Absolute: 0.6 10*3/uL (ref 0.1–1.0)
Monocytes Relative: 6.9 % (ref 3.0–12.0)
Neutro Abs: 6.3 10*3/uL (ref 1.4–7.7)
Neutrophils Relative %: 68.3 % (ref 43.0–77.0)
Platelets: 263 10*3/uL (ref 150.0–400.0)
RBC: 4.25 Mil/uL (ref 3.87–5.11)
RDW: 14.6 % (ref 11.5–15.5)
WBC: 9.1 10*3/uL (ref 4.0–10.5)

## 2022-04-30 LAB — COMPREHENSIVE METABOLIC PANEL
ALT: 13 U/L (ref 0–35)
AST: 19 U/L (ref 0–37)
Albumin: 4.3 g/dL (ref 3.5–5.2)
Alkaline Phosphatase: 62 U/L (ref 39–117)
BUN: 15 mg/dL (ref 6–23)
CO2: 29 mEq/L (ref 19–32)
Calcium: 10.2 mg/dL (ref 8.4–10.5)
Chloride: 105 mEq/L (ref 96–112)
Creatinine, Ser: 0.91 mg/dL (ref 0.40–1.20)
GFR: 60 mL/min — ABNORMAL LOW (ref >60.00)
Glucose, Bld: 83 mg/dL (ref 70–99)
Potassium: 4.2 mEq/L (ref 3.5–5.1)
Sodium: 142 mEq/L (ref 135–145)
Total Bilirubin: 0.4 mg/dL (ref 0.2–1.2)
Total Protein: 7.5 g/dL (ref 6.0–8.3)

## 2022-04-30 LAB — TSH: TSH: 2.25 u[IU]/mL (ref 0.35–5.50)

## 2022-04-30 MED ORDER — ALENDRONATE SODIUM 70 MG PO TABS: 70.0000 mg | ORAL_TABLET | ORAL | 4 refills | Status: DC

## 2022-04-30 NOTE — Patient Instructions (Signed)
Please Call [facility] to set up your account.

## 2022-04-30 NOTE — Addendum Note (Signed)
Addended by: Kelle Darting A on: 04/30/2022 10:17 AM   Modules accepted: Orders

## 2022-04-30 NOTE — Assessment & Plan Note (Addendum)
BP Readings from Last 3 Encounters:  04/30/22 (!) 141/58  03/10/22 136/78  02/21/22 (!) 133/51   Reports normal when she checks at [facility]. BP is acceptable for her age- will not adjust medications.

## 2022-04-30 NOTE — Assessment & Plan Note (Signed)
We discussed healthy diet, exercise.

## 2022-04-30 NOTE — Assessment & Plan Note (Signed)
She does have symptoms despite gabapentin. She has not tolerated lyrica in the past.  Will continue current dose of gabapentin.

## 2022-04-30 NOTE — Assessment & Plan Note (Signed)
Lab Results  Component Value Date   CHOL 145 10/29/2021   HDL 42.10 10/29/2021   LDLDIRECT 66.0 10/29/2021   TRIG 283.0 (H) 10/29/2021   CHOLHDL 3 10/29/2021   Maintained on crestor '20mg'$ .

## 2022-04-30 NOTE — Assessment & Plan Note (Signed)
New,  pt was told she was anemic. Will check iron studies/cbc as well as TSH, cmet.  I am concerned that she is depressed as well. If no significant medical findings, we discussed possibility of adding an antidepressant and she is agreeable to this plan .

## 2022-04-30 NOTE — Progress Notes (Signed)
Subjective:   By signing my name below, I, Michele Williamson, attest that this documentation has been prepared under the direction and in the presence of [name], NP. 04/30/2022   Patient ID: Michele Williamson, female    DOB: 1940-04-24, 82 y.o.   MRN: [id number]  Chief Complaint  Patient presents with   Hypertension    Here for follow up   Peripheral Neuropathy    Complains of bilateral knee and foot pain    HPI Patient is in today for a follow-up appointment.  Anemia: She has donated blood twice recently. Her iron levels were too low to donate after the previous blood donations. She denies having any tarry stools. She reports being more tired than normal.   A1C: Her A1C is slightly elevated recently at [facility] where she is participating in a study. A1C was 6.2.    Lab Results  Component Value Date   HGBA1C 6.0 11/09/2020      Neuropathy: She is still experiencing neuropathy symptoms. She takes 300 mg gabapentin 3x daily PO.  Cholesterol: Her triglyceride levels are elevated. She is taking 20 mg rosuvastatin daily PO to manage her cholesterol.  Lab Results  Component Value Date   CHOL 145 10/29/2021   HDL 42.10 10/29/2021   LDLDIRECT 66.0 10/29/2021   TRIG 283.0 (H) 10/29/2021   CHOLHDL 3 10/29/2021   Bone density: She is taking 70 mg fosamax daily PO to protect her bones.   Mood: Her mood is different than normal. She feels increasingly tired and is experiencing some stress.      Past Medical History:  Diagnosis Date   Arthritis    shoulder, hands , feet, knees,   Complication of anesthesia    Heart murmur    Hyperglycemia    Hyperlipemia    Hypertension    PONV (postoperative nausea and vomiting)     Past Surgical History:  Procedure Laterality Date   BUNIONECTOMY Right    CARPAL TUNNEL RELEASE     CATARACT EXTRACTION     CESAREAN SECTION     CHOLECYSTECTOMY  May 27, 1999   REVERSE SHOULDER ARTHROPLASTY Left 06/28/2020   Procedure: REVERSE SHOULDER  ARTHROPLASTY;  Surgeon: Justice Britain, MD;  Location: [facility];  Service: Orthopedics;  Laterality: Left;  113mn   ROTATOR CUFF REPAIR Right     Family History  Problem Relation Age of Onset   Hypertension Mother    Heart disease Mother    Bone cancer Father    Hypertension Father    Hyperlipidemia Father    Melanoma Brother    Stomach cancer Maternal Grandmother    Obesity Son     Social History   Socioeconomic History   Marital status: Widowed    Spouse name: Not on file   Number of children: Not on file   Years of education: Not on file   Highest education level: Not on file  Occupational History   Not on file  Tobacco Use   Smoking status: Never   Smokeless tobacco: Never  Vaping Use   Vaping Use: Never used  Substance and Sexual Activity   Alcohol use: No   Drug use: No   Sexual activity: Yes    Birth control/protection: None  Other Topics Concern   Not on file  Social History Narrative   [place] in [place]  Has an adopted son born 203-26-10  Husband died in 22008-03-26  Retired, worked as an iAgricultural consultant  Enjoys senior  center   Has a [employer]   Completed HS   Social Determinants of Health   Financial Resource Strain: Low Risk  (03/19/2022)   Overall Financial Resource Strain (CARDIA)    Difficulty of Paying Living Expenses: Not hard at all  Food Insecurity: No Food Insecurity (03/19/2022)   Hunger Vital Sign    Worried About Running Out of Food in the Last Year: Never true    Ran Out of Food in the Last Year: Never true  Transportation Needs: No Transportation Needs (03/19/2022)   PRAPARE - Hydrologist (Medical): No    Lack of Transportation (Non-Medical): No  Physical Activity: Inactive (03/06/2020)   Exercise Vital Sign    Days of Exercise per Week: 0 days    Minutes of Exercise per Session: 0 min  Stress: No Stress Concern Present (03/19/2022)   [place]     Feeling of Stress : Not at all  Social Connections: Moderately Integrated (03/19/2022)   Social Connection and Isolation Panel [NHANES]    Frequency of Communication with Friends and Family: More than three times a week    Frequency of Social Gatherings with Friends and Family: Three times a week    Attends Religious Services: More than 4 times per year    Active Member of Clubs or Organizations: Yes    Attends Archivist Meetings: More than 4 times per year    Marital Status: Widowed  Intimate Partner Violence: Not At Risk (03/19/2022)   Humiliation, Afraid, Rape, and Kick questionnaire    Fear of Current or Ex-Partner: No    Emotionally Abused: No    Physically Abused: No    Sexually Abused: No    Outpatient Medications Prior to Visit  Medication Sig Dispense Refill   aspirin EC 81 MG tablet Take 1 tablet (81 mg total) by mouth daily. Swallow whole. 90 tablet 3   CALCIUM CARBONATE-VITAMIN D PO Take 1 tablet by mouth daily. 600-400 mg     diclofenac Sodium (VOLTAREN) 1 % GEL Apply 2 g topically 4 (four) times daily as needed. 100 g 0   furosemide (LASIX) 80 MG tablet Take 1 tablet (80 mg total) by mouth daily. 90 tablet 0   gabapentin (NEURONTIN) 300 MG capsule TAKE 1 CAPSULE BY MOUTH 3 TIMES  DAILY 300 capsule 1   loratadine (CLARITIN) 10 MG tablet Take 10 mg by mouth daily. [employer]     meloxicam (MOBIC) 7.5 MG tablet TAKE 1 TABLET BY MOUTH DAILY 90 tablet 3   Multiple Vitamins-Minerals (MULTIVITAMIN WITH MINERALS) tablet Take 1 tablet by mouth daily.     rosuvastatin (CRESTOR) 20 MG tablet Take 1 tablet (20 mg total) by mouth at bedtime. 90 tablet 1   alendronate (FOSAMAX) 70 MG tablet Take 1 tablet (70 mg total) by mouth every 7 (seven) days. Take with a full glass of water on an empty stomach. 12 tablet 3   No facility-administered medications prior to visit.    Allergies  Allergen Reactions   Oxycodone-Acetaminophen Nausea Only   Penicillins      Childhood Pt received Ancef on 06-28-20 and tolerated it well   Pregabalin     Dizziness   Tramadol Nausea And Vomiting    Review of Systems  Gastrointestinal:        (-) tarry stools        Objective:    Physical Exam Constitutional:  General: She is not in acute distress.    Appearance: Normal appearance.  HENT:     Head: Normocephalic and atraumatic.     Right Ear: External ear normal.     Left Ear: External ear normal.  Eyes:     Extraocular Movements: Extraocular movements intact.     Pupils: Pupils are equal, round, and reactive to light.  Cardiovascular:     Rate and Rhythm: Normal rate and regular rhythm.     Heart sounds: No murmur heard.    No gallop.  Pulmonary:     Effort: Pulmonary effort is normal. No respiratory distress.     Breath sounds: Normal breath sounds. No wheezing or rales.  Skin:    General: Skin is warm.  Neurological:     Mental Status: She is alert and oriented to person, place, and time.  Psychiatric:        Judgment: Judgment normal.     BP (!) 141/58 (BP Location: Right Arm, Patient Position: Sitting, Cuff Size: Large)   Pulse 86   Temp 98.6 F (37 C) (Oral)   Resp 16   Wt 172 lb (78 kg)   SpO2 96%   BMI 30.47 kg/m  Wt Readings from Last 3 Encounters:  04/30/22 172 lb (78 kg)  03/10/22 160 lb (72.6 kg)  02/21/22 173 lb (78.5 kg)       Assessment & Plan:  Iron deficiency anemia, unspecified iron deficiency anemia type -     CBC with Differential/Platelet -     Iron, TIBC and Ferritin Panel -     Fecal occult blood, imunochemical  Primary hypertension Assessment & Plan: BP Readings from Last 3 Encounters:  04/30/22 (!) 141/58  03/10/22 136/78  02/21/22 (!) 133/51   Reports normal when she checks at [employer]. BP is acceptable for her age- will not adjust medications.   Orders: -     Comprehensive metabolic panel  Other fatigue Assessment & Plan: New,  pt was told she was anemic. Will check iron studies/cbc as  well as TSH, cmet.  I am concerned that she is depressed as well. If no significant medical findings, we discussed possibility of adding an antidepressant and she is agreeable to this plan .  Orders: -     TSH  Hyperlipidemia, unspecified hyperlipidemia type Assessment & Plan: Lab Results  Component Value Date   CHOL 145 10/29/2021   HDL 42.10 10/29/2021   LDLDIRECT 66.0 10/29/2021   TRIG 283.0 (H) 10/29/2021   CHOLHDL 3 10/29/2021   Maintained on crestor 41m.    Osteoarthritis, unspecified osteoarthritis type, unspecified site Assessment & Plan: Continues fosamax.    Other polyneuropathy Assessment & Plan: She does have symptoms despite gabapentin. She has not tolerated lyrica in the past.  Will continue current dose of gabapentin.    Hyperglycemia Assessment & Plan: We discussed healthy diet, exercise.    Other orders -     Alendronate Sodium; Take 1 tablet (70 mg total) by mouth every 7 (seven) days. Take with a full glass of water on an empty stomach.  Dispense: 12 tablet; Refill: 4    I, MNance Pear NP, personally preformed the services described in this documentation.  All medical record entries made by the scribe were at my direction and in my presence.  I have reviewed the chart and discharge instructions (if applicable) and agree that the record reflects my personal performance and is accurate and complete. 04/30/2022  I,Melissa S O'Sullivan,acting as a  scribe for Nance Pear, NP.,have documented all relevant documentation on the behalf of Nance Pear, NP,as directed by  Nance Pear, NP while in the presence of Nance Pear, NP.   Nance Pear, NP

## 2022-04-30 NOTE — Assessment & Plan Note (Signed)
Continues fosamax.

## 2022-05-01 ENCOUNTER — Encounter: Payer: Self-pay | Admitting: Family

## 2022-05-01 ENCOUNTER — Other Ambulatory Visit: Payer: Self-pay | Admitting: Family

## 2022-05-01 LAB — IRON,TIBC AND FERRITIN PANEL
%SAT: 13 % (calc) — ABNORMAL LOW (ref 16–45)
Ferritin: 9 ng/mL — ABNORMAL LOW (ref 16–288)
Iron: 57 ug/dL (ref 45–160)
TIBC: 455 mcg/dL (calc) — ABNORMAL HIGH (ref 250–450)

## 2022-05-01 MED ORDER — IRON (FERROUS SULFATE) 325 (65 FE) MG PO TABS: 325.0000 mg | ORAL_TABLET | ORAL | Status: AC

## 2022-05-07 ENCOUNTER — Telehealth: Payer: Self-pay | Admitting: Family

## 2022-05-07 DIAGNOSIS — D509 Iron deficiency anemia, unspecified: Secondary | ICD-10-CM

## 2022-05-07 NOTE — Telephone Encounter (Signed)
Left message for pt to return my call. Packet is in the lab.

## 2022-05-07 NOTE — Telephone Encounter (Signed)
-----   Message from [name], NP sent at 05/02/2022  2:19 PM EST ----- Hemocult in?

## 2022-05-07 NOTE — Telephone Encounter (Signed)
Hell, this is a patient whom I would like to complete hemocult cards please. Dx iron deficiency.

## 2022-05-12 NOTE — Telephone Encounter (Signed)
Pt returned call on 3/7 and was notified by front office staff. Packet was placed at front desk for pick up.

## 2022-06-02 ENCOUNTER — Other Ambulatory Visit: Payer: Self-pay

## 2022-06-02 ENCOUNTER — Encounter: Payer: Self-pay | Admitting: Family

## 2022-06-02 MED ORDER — MELOXICAM 7.5 MG PO TABS: 7.5000 mg | ORAL_TABLET | Freq: Every day | ORAL | 3 refills | Status: DC

## 2022-06-02 NOTE — Telephone Encounter (Signed)
Rx sent to [facility]

## 2022-06-09 DIAGNOSIS — Z1231 Encounter for screening mammogram for malignant neoplasm of breast: Secondary | ICD-10-CM | POA: Diagnosis not present

## 2022-06-09 LAB — HM MAMMOGRAPHY

## 2022-06-16 ENCOUNTER — Encounter: Payer: Self-pay | Admitting: *Deleted

## 2022-06-18 ENCOUNTER — Other Ambulatory Visit: Payer: Medicare HMO

## 2022-06-18 ENCOUNTER — Encounter: Payer: Self-pay | Admitting: Family

## 2022-06-18 DIAGNOSIS — D509 Iron deficiency anemia, unspecified: Secondary | ICD-10-CM

## 2022-06-18 NOTE — Telephone Encounter (Signed)
Patient advised results not ready yet for IFOB submited today

## 2022-06-19 ENCOUNTER — Other Ambulatory Visit: Payer: Self-pay | Admitting: Family

## 2022-06-19 ENCOUNTER — Other Ambulatory Visit (INDEPENDENT_AMBULATORY_CARE_PROVIDER_SITE_OTHER): Payer: Medicare HMO

## 2022-06-19 DIAGNOSIS — D509 Iron deficiency anemia, unspecified: Secondary | ICD-10-CM | POA: Diagnosis not present

## 2022-06-19 LAB — HEMOCCULT SLIDES (X 3 CARDS)
Fecal Occult Blood: NEGATIVE
OCCULT 1: NEGATIVE
OCCULT 2: NEGATIVE
OCCULT 3: NEGATIVE
OCCULT 4: NEGATIVE
OCCULT 5: NEGATIVE

## 2022-06-19 NOTE — Addendum Note (Signed)
Addended by: Wilford Corner on: 06/19/2022 08:26 AM   Modules accepted: Orders

## 2022-06-27 ENCOUNTER — Encounter: Payer: Self-pay | Admitting: Family

## 2022-06-27 MED ORDER — GABAPENTIN 300 MG PO CAPS: 300.0000 mg | ORAL_CAPSULE | Freq: Three times a day (TID) | ORAL | 1 refills | Status: DC

## 2022-08-04 ENCOUNTER — Ambulatory Visit: Payer: Medicare HMO | Admitting: Family

## 2022-08-05 ENCOUNTER — Encounter: Payer: Self-pay | Admitting: Family

## 2022-08-05 ENCOUNTER — Ambulatory Visit (INDEPENDENT_AMBULATORY_CARE_PROVIDER_SITE_OTHER): Payer: Medicare HMO | Admitting: Family

## 2022-08-05 VITALS — BP 139/66 | HR 96 | Temp 98.4°F | Resp 16 | Wt 165.0 lb

## 2022-08-05 DIAGNOSIS — I10 Essential (primary) hypertension: Secondary | ICD-10-CM

## 2022-08-05 DIAGNOSIS — I5032 Chronic diastolic (congestive) heart failure: Secondary | ICD-10-CM | POA: Diagnosis not present

## 2022-08-05 DIAGNOSIS — E785 Hyperlipidemia, unspecified: Secondary | ICD-10-CM

## 2022-08-05 DIAGNOSIS — J309 Allergic rhinitis, unspecified: Secondary | ICD-10-CM

## 2022-08-05 DIAGNOSIS — D509 Iron deficiency anemia, unspecified: Secondary | ICD-10-CM | POA: Diagnosis not present

## 2022-08-05 DIAGNOSIS — H8110 Benign paroxysmal vertigo, unspecified ear: Secondary | ICD-10-CM | POA: Insufficient documentation

## 2022-08-05 DIAGNOSIS — R739 Hyperglycemia, unspecified: Secondary | ICD-10-CM

## 2022-08-05 DIAGNOSIS — R42 Dizziness and giddiness: Secondary | ICD-10-CM

## 2022-08-05 LAB — COMPREHENSIVE METABOLIC PANEL
ALT: 18 U/L (ref 0–35)
AST: 28 U/L (ref 0–37)
Albumin: 4.9 g/dL (ref 3.5–5.2)
Alkaline Phosphatase: 63 U/L (ref 39–117)
BUN: 15 mg/dL (ref 6–23)
CO2: 32 mEq/L (ref 19–32)
Calcium: 10.5 mg/dL (ref 8.4–10.5)
Chloride: 99 mEq/L (ref 96–112)
Creatinine, Ser: 0.99 mg/dL (ref 0.40–1.20)
GFR: 54.13 mL/min — ABNORMAL LOW (ref >60.00)
Glucose, Bld: 98 mg/dL (ref 70–99)
Potassium: 4.1 mEq/L (ref 3.5–5.1)
Sodium: 142 mEq/L (ref 135–145)
Total Bilirubin: 0.5 mg/dL (ref 0.2–1.2)
Total Protein: 8.4 g/dL — ABNORMAL HIGH (ref 6.0–8.3)

## 2022-08-05 LAB — LIPID PANEL
Cholesterol: 173 mg/dL (ref 0–200)
HDL: 46.5 mg/dL (ref >39.00)
NonHDL: 126.53
Total CHOL/HDL Ratio: 4
Triglycerides: 255 mg/dL — ABNORMAL HIGH (ref 0.0–149.0)
VLDL: 51 mg/dL — ABNORMAL HIGH (ref 0.0–40.0)

## 2022-08-05 LAB — CBC WITH DIFFERENTIAL/PLATELET
Basophils Absolute: 0.1 10*3/uL (ref 0.0–0.1)
Basophils Relative: 1.6 % (ref 0.0–3.0)
Eosinophils Absolute: 0.1 10*3/uL (ref 0.0–0.7)
Eosinophils Relative: 1.5 % (ref 0.0–5.0)
HCT: 43.9 % (ref 36.0–46.0)
Hemoglobin: 14.3 g/dL (ref 12.0–15.0)
Lymphocytes Relative: 24.9 % (ref 12.0–46.0)
Lymphs Abs: 2.1 10*3/uL (ref 0.7–4.0)
MCHC: 32.6 g/dL (ref 30.0–36.0)
MCV: 89.4 fl (ref 78.0–100.0)
Monocytes Absolute: 0.7 10*3/uL (ref 0.1–1.0)
Monocytes Relative: 8.1 % (ref 3.0–12.0)
Neutro Abs: 5.3 10*3/uL (ref 1.4–7.7)
Neutrophils Relative %: 63.9 % (ref 43.0–77.0)
Platelets: 240 10*3/uL (ref 150.0–400.0)
RBC: 4.91 Mil/uL (ref 3.87–5.11)
RDW: 15.2 % (ref 11.5–15.5)
WBC: 8.3 10*3/uL (ref 4.0–10.5)

## 2022-08-05 LAB — HEMOGLOBIN A1C: Hgb A1c MFr Bld: 6.4 % (ref 4.6–6.5)

## 2022-08-05 LAB — LDL CHOLESTEROL, DIRECT: Direct LDL: 93 mg/dL

## 2022-08-05 NOTE — Assessment & Plan Note (Signed)
Controlled with daily over-the-counter medication from [facility]. -Continue current management.

## 2022-08-05 NOTE — Assessment & Plan Note (Signed)
On Crestor 20mg . Last cholesterol check was last summer. -Check cholesterol levels today.

## 2022-08-05 NOTE — Assessment & Plan Note (Signed)
Blood pressure controlled without medication. -Continue current management.

## 2022-08-05 NOTE — Assessment & Plan Note (Signed)
A1C was in the borderline range in the past. -Check A1C today.

## 2022-08-05 NOTE — Assessment & Plan Note (Addendum)
Wt Readings from Last 3 Encounters:  08/05/22 165 lb (74.8 kg)  04/30/22 172 lb (78 kg)  03/10/22 160 lb (72.6 kg)   On Lasix. Reports frequent urination for about 2.5 hours after taking medication. Weight has been stable with a recent decrease of 7 pounds since last visit in February. -Continue current management.

## 2022-08-05 NOTE — Progress Notes (Signed)
Subjective:     Patient ID: Michele Williamson, female    DOB: [birth year], 82 y.o.   MRN: [id number]  Chief Complaint  Patient presents with   Hypertension    Here for follow up   Dizziness    Patient reports dizziness, mostly when laying in bed.     The patient, with a history of hypertension and hyperlipidemia, presents with intermittent dizziness that started last week. The episodes occur every other day and last for about 10-30 seconds. During these episodes, she experiences a sensation of 'different colors going around.' The patient also reports a feeling of fullness in the left ear, similar to having water in the ear. She denies any positional triggers for the dizziness and manages it by lying still until it resolves.  The patient also reports a recent weight gain of 7 pounds, which she attributes to increased snacking. She has since made dietary changes and has noticed a decrease in weight. She also reports taking an over-the-counter allergy medication daily from [facility], which seems to control her symptoms.  The patient also mentions participating in a study trial but decided to discontinue due to the travel requirements. A1C was elevated during the trial.   Health Maintenance Due  Topic Date Due   COVID-19 Vaccine (8 - 2023-24 season) 11/01/2021    Past Medical History:  Diagnosis Date   Arthritis    shoulder, hands , feet, knees,   Complication of anesthesia    Heart murmur    Hyperglycemia    Hyperlipemia    Hypertension    PONV (postoperative nausea and vomiting)     Past Surgical History:  Procedure Laterality Date   BUNIONECTOMY Right    CARPAL TUNNEL RELEASE     CATARACT EXTRACTION     CESAREAN SECTION     CHOLECYSTECTOMY  Sep 05, 1999   REVERSE SHOULDER ARTHROPLASTY Left 06/28/2020   Procedure: REVERSE SHOULDER ARTHROPLASTY;  Surgeon: Francena Hanly, MD;  Location: [facility];  Service: Orthopedics;  Laterality: Left;    ROTATOR CUFF REPAIR Right     Family  History  Problem Relation Age of Onset   Hypertension Mother    Heart disease Mother    Bone cancer Father    Hypertension Father    Hyperlipidemia Father    Melanoma Brother    Stomach cancer Maternal Grandmother    Obesity Son     Social History   Socioeconomic History   Marital status: Widowed    Spouse name: Not on file   Number of children: Not on file   Years of education: Not on file   Highest education level: 12th grade  Occupational History   Not on file  Tobacco Use   Smoking status: Never   Smokeless tobacco: Never  Vaping Use   Vaping Use: Never used  Substance and Sexual Activity   Alcohol use: No   Drug use: No   Sexual activity: Yes    Birth control/protection: None  Other Topics Concern   Not on file  Social History Narrative   Lives in [place]   Has an adopted son born 09/04/2008   Husband died in 09-05-06   Retired, worked as an Insurance underwriter   Enjoys senior center   Has a Veterinary surgeon   Completed HS   Social Determinants of Corporate investment banker Strain: Low Risk  (08/04/2022)   Overall Financial Resource Strain (CARDIA)    Difficulty of Paying Living Expenses: Not hard at all  Food  Insecurity: No Food Insecurity (08/04/2022)   Hunger Vital Sign    Worried About Running Out of Food in the Last Year: Never true    Ran Out of Food in the Last Year: Never true  Transportation Needs: No Transportation Needs (08/04/2022)   PRAPARE - Administrator, Civil Service (Medical): No    Lack of Transportation (Non-Medical): No  Physical Activity: Unknown (08/04/2022)   Exercise Vital Sign    Days of Exercise per Week: 0 days    Minutes of Exercise per Session: Not on file  Stress: Stress Concern Present (08/04/2022)   [employer] of Occupational Health - Occupational Stress Questionnaire    Feeling of Stress : Rather much  Social Connections: Moderately Integrated (08/04/2022)   Social Connection and Isolation Panel [NHANES]    Frequency of  Communication with Friends and Family: More than three times a week    Frequency of Social Gatherings with Friends and Family: More than three times a week    Attends Religious Services: More than 4 times per year    Active Member of [employer] or Organizations: Yes    Attends Banker Meetings: More than 4 times per year    Marital Status: Widowed  Intimate Partner Violence: Not At Risk (03/19/2022)   Humiliation, Afraid, Rape, and Kick questionnaire    Fear of Current or Ex-Partner: No    Emotionally Abused: No    Physically Abused: No    Sexually Abused: No    Outpatient Medications Prior to Visit  Medication Sig Dispense Refill   alendronate (FOSAMAX) 70 MG tablet Take 1 tablet (70 mg total) by mouth every 7 (seven) days. Take with a full glass of water on an empty stomach. 12 tablet 4   aspirin EC 81 MG tablet Take 1 tablet (81 mg total) by mouth daily. Swallow whole. 90 tablet 3   CALCIUM CARBONATE-VITAMIN D PO Take 1 tablet by mouth daily. 600-400 mg     diclofenac Sodium (VOLTAREN) 1 % GEL Apply 2 g topically 4 (four) times daily as needed. 100 g 0   furosemide (LASIX) 80 MG tablet Take 1 tablet (80 mg total) by mouth daily. 90 tablet 0   gabapentin (NEURONTIN) 300 MG capsule Take 1 capsule (300 mg total) by mouth 3 (three) times daily. 270 capsule 1   Iron, Ferrous Sulfate, 325 (65 Fe) MG TABS Take 325 mg by mouth every other day. 30 tablet    loratadine (CLARITIN) 10 MG tablet Take 10 mg by mouth daily. [employer]     meloxicam (MOBIC) 7.5 MG tablet Take 1 tablet (7.5 mg total) by mouth daily. 90 tablet 3   Multiple Vitamins-Minerals (MULTIVITAMIN WITH MINERALS) tablet Take 1 tablet by mouth daily.     rosuvastatin (CRESTOR) 20 MG tablet Take 1 tablet (20 mg total) by mouth at bedtime. 90 tablet 1   No facility-administered medications prior to visit.    Allergies  Allergen Reactions   Oxycodone-Acetaminophen Nausea Only   Penicillins     Childhood Pt received  Ancef on 06-28-20 and tolerated it well   Pregabalin     Dizziness   Tramadol Nausea And Vomiting    Review of Systems  Neurological:  Positive for dizziness.   See HPI    Objective:    Physical Exam Constitutional:      General: She is not in acute distress.    Appearance: Normal appearance. She is well-developed.  HENT:     Head:  Normocephalic and atraumatic.     Right Ear: External ear normal.     Left Ear: External ear normal.  Eyes:     General: No scleral icterus. Neck:     Thyroid: No thyromegaly.  Cardiovascular:     Rate and Rhythm: Normal rate and regular rhythm.     Heart sounds: Normal heart sounds. No murmur heard. Pulmonary:     Effort: Pulmonary effort is normal. No respiratory distress.     Breath sounds: Normal breath sounds. No wheezing.  Musculoskeletal:     Cervical back: Neck supple.     Right lower leg: 2+ Edema present.     Left lower leg: 2+ Edema present.  Skin:    General: Skin is warm and dry.  Neurological:     Mental Status: She is alert and oriented to person, place, and time.  Psychiatric:        Mood and Affect: Mood normal.        Behavior: Behavior normal.        Thought Content: Thought content normal.        Judgment: Judgment normal.     BP 139/66 (BP Location: Right Arm, Patient Position: Sitting, Cuff Size: Small)   Pulse 96   Temp 98.4 F (36.9 C) (Oral)   Resp 16   Wt 165 lb (74.8 kg)   SpO2 97%   BMI 29.23 kg/m  Wt Readings from Last 3 Encounters:  08/05/22 165 lb (74.8 kg)  04/30/22 172 lb (78 kg)  03/10/22 160 lb (72.6 kg)       Assessment & Plan:   Problem List Items Addressed This Visit       Unprioritized   Iron deficiency anemia    On iron supplement every other day when remembered. Last iron levels were slightly low but not anemic. -Check iron levels today.       Relevant Orders   CBC w/Diff   Iron, TIBC and Ferritin Panel   Hypertension - Primary    Blood pressure controlled without  medication. -Continue current management.      Relevant Orders   Comp Met (CMET)   Hyperlipidemia     On Crestor 20mg . Last cholesterol check was last summer. -Check cholesterol levels today.      Relevant Orders   Lipid panel   Comp Met (CMET)   Hyperglycemia    A1C was in the borderline range in the past. -Check A1C today.      Relevant Orders   HgB A1c   Dizziness    New. Mild symptoms.  Like due to BPPV.  Monitor for now.       Chronic diastolic CHF (congestive heart failure) (HCC)    Wt Readings from Last 3 Encounters:  08/05/22 165 lb (74.8 kg)  04/30/22 172 lb (78 kg)  03/10/22 160 lb (72.6 kg)  On Lasix. Reports frequent urination for about 2.5 hours after taking medication. Weight has been stable with a recent decrease of 7 pounds since last visit in February. -Continue current management.      Benign paroxysmal positional vertigo    New onset of intermittent dizziness, lasting about 30 seconds0 intermittent. Only occurs when laying down. Sounds like mild vertigo. Will monitor for now.         Allergic rhinitis    Controlled with daily over-the-counter medication from [facility]. -Continue current management.       I am having Michele Williamson maintain her loratadine, aspirin EC, multivitamin with  minerals, diclofenac Sodium, CALCIUM CARBONATE-VITAMIN D PO, furosemide, rosuvastatin, alendronate, Iron (Ferrous Sulfate), meloxicam, and gabapentin.  No orders of the defined types were placed in this encounter.

## 2022-08-05 NOTE — Assessment & Plan Note (Signed)
New. Mild symptoms.  Like due to BPPV.  Monitor for now.

## 2022-08-05 NOTE — Assessment & Plan Note (Addendum)
New onset of intermittent dizziness, lasting about 30 seconds0 intermittent. Only occurs when laying down. Sounds like mild vertigo. Will monitor for now.

## 2022-08-05 NOTE — Assessment & Plan Note (Signed)
On iron supplement every other day when remembered. Last iron levels were slightly low but not anemic. -Check iron levels today.

## 2022-08-05 NOTE — Patient Instructions (Signed)
VISIT SUMMARY:  During our visit, we discussed your recent episodes of dizziness, your hypertension, hyperlipidemia, iron deficiency, congestive heart failure, allergies, and pre-diabetes. We also talked about your recent weight loss and your concerns about your daughter's health.  YOUR PLAN:  -VERTIGO: Vertigo is a sensation of feeling off balance, often described as a spinning sensation. Let us know if symptoms worsen or if they persist.  -HYPERTENSION: Hypertension, or high blood pressure, is currently under control without medication. Continue with your current management.  -HYPERLIPIDEMIA: Hyperlipidemia is a condition where there are high levels of fats (lipids) in the blood. We will check your cholesterol levels today.  -IRON DEFICIENCY: Iron deficiency is when your body doesn't have enough iron. We will check your iron levels today.  -CONGESTIVE HEART FAILURE: Congestive heart failure is a condition where your heart doesn't pump blood as well as it should. Your weight has been stable with a recent decrease of 7 pounds. Continue with your current management.  -ALLERGIES: Your allergies are currently controlled with daily over-the-counter medication. Continue with your current management.  -PRE-DIABETES: Pre-diabetes is a condition where your blood sugar levels are higher than normal but not high enough to be diagnosed as diabetes. We will check your A1C levels today.  INSTRUCTIONS:  Please follow up in 6 months. If you have any concerns or if your symptoms worsen, please contact the office.

## 2022-08-06 LAB — IRON,TIBC AND FERRITIN PANEL
%SAT: 30 % (calc) (ref 16–45)
Ferritin: 30 ng/mL (ref 16–288)
Iron: 126 ug/dL (ref 45–160)
TIBC: 426 mcg/dL (calc) (ref 250–450)

## 2022-08-12 DIAGNOSIS — H52223 Regular astigmatism, bilateral: Secondary | ICD-10-CM | POA: Diagnosis not present

## 2022-08-12 DIAGNOSIS — Z961 Presence of intraocular lens: Secondary | ICD-10-CM | POA: Diagnosis not present

## 2022-08-12 DIAGNOSIS — Z135 Encounter for screening for eye and ear disorders: Secondary | ICD-10-CM | POA: Diagnosis not present

## 2022-08-12 DIAGNOSIS — H524 Presbyopia: Secondary | ICD-10-CM | POA: Diagnosis not present

## 2022-10-28 DIAGNOSIS — H6123 Impacted cerumen, bilateral: Secondary | ICD-10-CM | POA: Insufficient documentation

## 2023-02-10 ENCOUNTER — Ambulatory Visit: Payer: Medicare HMO | Admitting: Family

## 2023-02-11 ENCOUNTER — Ambulatory Visit: Payer: Medicare HMO | Admitting: Family

## 2023-02-11 VITALS — BP 128/70 | HR 82 | Temp 98.0°F | Resp 16 | Ht 63.0 in | Wt 157.0 lb

## 2023-02-11 DIAGNOSIS — M810 Age-related osteoporosis without current pathological fracture: Secondary | ICD-10-CM | POA: Diagnosis not present

## 2023-02-11 DIAGNOSIS — E785 Hyperlipidemia, unspecified: Secondary | ICD-10-CM | POA: Diagnosis not present

## 2023-02-11 DIAGNOSIS — M79672 Pain in left foot: Secondary | ICD-10-CM | POA: Insufficient documentation

## 2023-02-11 DIAGNOSIS — M79671 Pain in right foot: Secondary | ICD-10-CM

## 2023-02-11 DIAGNOSIS — D509 Iron deficiency anemia, unspecified: Secondary | ICD-10-CM

## 2023-02-11 DIAGNOSIS — R739 Hyperglycemia, unspecified: Secondary | ICD-10-CM | POA: Diagnosis not present

## 2023-02-11 HISTORY — DX: Pain in right foot: M79.671

## 2023-02-11 NOTE — Assessment & Plan Note (Signed)
  Chronic, constant pain in both feet, unrelieved by gabapentin. Unclear etiology, possibly neuropathy or arthritis. -Refer to podiatry for further evaluation and management.

## 2023-02-11 NOTE — Assessment & Plan Note (Signed)
  On Fosamax, with stable bone density on recent DEXA scan. -Continue Fosamax.

## 2023-02-11 NOTE — Patient Instructions (Signed)
VISIT SUMMARY:  During today's visit, we discussed your chronic foot pain, high blood pressure, high triglycerides, osteoporosis, and general health maintenance. We reviewed your current medications and management plans for each condition.  YOUR PLAN:  -FOOT PAIN: You have chronic, constant pain in both feet that has not been relieved by gabapentin. This pain may be due to neuropathy or arthritis. We will refer you to a podiatrist for further evaluation and management.  -HYPERLIPIDEMIA: Hyperlipidemia means you have high levels of fats (lipids) in your blood, such as cholesterol and triglycerides. You are currently taking Crestor 20mg  daily, which has helped improve your triglycerides and LDL levels. Continue taking Crestor as prescribed.  -OSTEOPOROSIS: Osteoporosis is a condition where your bones become weak and brittle. You are taking Fosamax to help strengthen your bones, and your recent bone density scan shows stable results. Continue taking Fosamax as directed.  -GENERAL HEALTH MAINTENANCE: For your overall health, continue taking your iron supplements every other day to manage anemia. We will also check your blood glucose and kidney function today to ensure everything is in order. Follow up in 4 months for a routine check-up.  INSTRUCTIONS:  Please follow up with a podiatrist for your foot pain as soon as possible. Continue taking your current medications as prescribed. We will check your blood glucose and kidney function today. Schedule your next follow-up appointment in 4 months.

## 2023-02-11 NOTE — Assessment & Plan Note (Signed)
Lab Results  Component Value Date   CHOL 173 08/05/2022   HDL 46.50 08/05/2022   LDLDIRECT 93.0 08/05/2022   TRIG 255.0 (H) 08/05/2022   CHOLHDL 4 08/05/2022   LDL at goal on crestor, discussed diet modifications for elevated triglycerides.

## 2023-02-11 NOTE — Assessment & Plan Note (Addendum)
Lab Results  Component Value Date   HGBA1C 6.4 08/05/2022   Will update A1C.

## 2023-02-11 NOTE — Assessment & Plan Note (Signed)
Lab Results  Component Value Date   WBC 8.3 08/05/2022   HGB 14.3 08/05/2022   HCT 43.9 08/05/2022   MCV 89.4 08/05/2022   PLT 240.0 08/05/2022   Stable continue iron 1 tab every other day.

## 2023-02-11 NOTE — Progress Notes (Signed)
Subjective:     Patient ID: Michele Williamson, female    DOB: 11-11-1940, 82 y.o.   MRN: [id number]  Chief Complaint  Patient presents with   Hypertension    Here for follow up    Hypertension    Discussed the use of AI scribe software for clinical note transcription with the patient, who gave verbal consent to proceed.  History of Present Illness   The patient, with a history of vertigo, high blood pressure, high triglycerides, osteoporosis, and neuropathy, presents with chronic foot pain. The pain is described as constant, affecting the whole foot, and is severe enough to limit mobility. The patient reports no relief from the pain, which is managed with Tylenol. The patient has previously tried gabapentin for the pain, but it was not effective. The patient also mentions a previous issue with sciatica, which has since resolved.  The patient's high blood pressure is generally managed without medication, with readings typically in the 130s/60s or 120s/70s. The patient's high triglycerides have improved over time, and the patient is currently on Crestor for cholesterol management. The patient also takes iron supplements every other day.  The patient's osteoporosis was diagnosed via a bone density test, and the patient is currently on Fosamax for treatment. The patient also mentions a previous diagnosis of neuropathy, which was diagnosed via a nerve conduction study.          Health Maintenance Due  Topic Date Due   Medicare Annual Wellness (AWV)  03/20/2023    Past Medical History:  Diagnosis Date   Arthritis    shoulder, hands , feet, knees,   Complication of anesthesia    Heart murmur    Hyperglycemia    Hyperlipemia    Hypertension    PONV (postoperative nausea and vomiting)     Past Surgical History:  Procedure Laterality Date   BUNIONECTOMY Right    CARPAL TUNNEL RELEASE     CATARACT EXTRACTION     CESAREAN SECTION     CHOLECYSTECTOMY  2000-03-06   REVERSE SHOULDER  ARTHROPLASTY Left 06/28/2020   Procedure: REVERSE SHOULDER ARTHROPLASTY;  Surgeon: Francena Hanly, MD;  Location: [facility];  Service: Orthopedics;  Laterality: Left;    ROTATOR CUFF REPAIR Right     Family History  Problem Relation Age of Onset   Hypertension Mother    Heart disease Mother    Bone cancer Father    Hypertension Father    Hyperlipidemia Father    Melanoma Brother    Stomach cancer Maternal Grandmother    Obesity Son     Social History   Socioeconomic History   Marital status: Widowed    Spouse name: Not on file   Number of children: Not on file   Years of education: Not on file   Highest education level: 12th grade  Occupational History   Not on file  Tobacco Use   Smoking status: Never   Smokeless tobacco: Never  Vaping Use   Vaping status: Never Used  Substance and Sexual Activity   Alcohol use: No   Drug use: No   Sexual activity: Yes    Birth control/protection: None  Other Topics Concern   Not on file  Social History Narrative   Lives in [place]   Has an adopted son born 03-06-09   Husband died in March 07, 2007   Retired, worked as an Insurance underwriter   Enjo[facility] senior center   Has a Veterinary surgeon   Completed HS   Social Determinants  of Health   Financial Resource Strain: Low Risk  (08/04/2022)   Overall Financial Resource Strain (CARDIA)    Difficulty of Paying Living Expenses: Not hard at all  Food Insecurity: No Food Insecurity (08/04/2022)   Hunger Vital Sign    Worried About Running Out of Food in the Last Year: Never true    Ran Out of Food in the Last Year: Never true  Transportation Needs: No Transportation Needs (08/04/2022)   PRAPARE - Administrator, Civil Service (Medical): No    Lack of Transportation (Non-Medical): No  Physical Activity: Unknown (08/04/2022)   Exercise Vital Sign    Days of Exercise per Week: 0 days    Minutes of Exercise per Session: Not on file  Stress: Stress Concern Present (08/04/2022)   [employer]  of Occupational Health - Occupational Stress Questionnaire    Feeling of Stress : Rather much  Social Connections: Unknown (11/14/2022)   Received from [facility]   Social Network    Social Network: Not on file  Intimate Partner Violence: Unknown (11/14/2022)   Received from [facility]   HITS    Physically Hurt: Not on file    Insult or Talk Down To: Not on file    Threaten Physical Harm: Not on file    Scream or Curse: Not on file    Outpatient Medications Prior to Visit  Medication Sig Dispense Refill   alendronate (FOSAMAX) 70 MG tablet Take 1 tablet (70 mg total) by mouth every 7 (seven) days. Take with a full glass of water on an empty stomach. 12 tablet 4   aspirin EC 81 MG tablet Take 1 tablet (81 mg total) by mouth daily. Swallow whole. 90 tablet 3   CALCIUM CARBONATE-VITAMIN D PO Take 1 tablet by mouth daily. 600-400 mg     diclofenac Sodium (VOLTAREN) 1 % GEL Apply 2 g topically 4 (four) times daily as needed. 100 g 0   furosemide (LASIX) 80 MG tablet Take 1 tablet (80 mg total) by mouth daily. 90 tablet 0   Iron, Ferrous Sulfate, 325 (65 Fe) MG TABS Take 325 mg by mouth every other day. 30 tablet    loratadine (CLARITIN) 10 MG tablet Take 10 mg by mouth daily. Wal[employer] Brand     meloxicam (MOBIC) 7.5 MG tablet Take 1 tablet (7.5 mg total) by mouth daily. 90 tablet 3   Multiple Vitamins-Minerals (MULTIVITAMIN WITH MINERALS) tablet Take 1 tablet by mouth daily.     rosuvastatin (CRESTOR) 20 MG tablet Take 1 tablet (20 mg total) by mouth at bedtime. 90 tablet 1   gabapentin (NEURONTIN) 300 MG capsule Take 1 capsule (300 mg total) by mouth 3 (three) times daily. 270 capsule 1   No facility-administered medications prior to visit.    Allergies  Allergen Reactions   Oxycodone-Acetaminophen Nausea Only   Penicillins     Childhood Pt received Ancef on 06-28-20 and tolerated it well   Pregabalin     Dizziness   Tramadol Nausea And Vomiting    ROS See HPI    Objective:     Physical Exam Constitutional:      General: She is not in acute distress.    Appearance: Normal appearance. She is well-developed.  HENT:     Head: Normocephalic and atraumatic.     Right Ear: External ear normal.     Left Ear: External ear normal.  Eyes:     General: No scleral icterus. Neck:  Thyroid: No thyromegaly.  Cardiovascular:     Rate and Rhythm: Normal rate and regular rhythm.     Heart sounds: Normal heart sounds. No murmur heard. Pulmonary:     Effort: Pulmonary effort is normal. No respiratory distress.     Breath sounds: Normal breath sounds. No wheezing.  Musculoskeletal:     Cervical back: Neck supple.  Skin:    General: Skin is warm and dry.  Neurological:     Mental Status: She is alert and oriented to person, place, and time.  Psychiatric:        Mood and Affect: Mood normal.        Behavior: Behavior normal.        Thought Content: Thought content normal.        Judgment: Judgment normal.      BP 128/70   Pulse 82   Temp 98 F (36.7 C) (Oral)   Resp 16   Ht 5\' 3"  (1.6 m)   Wt 157 lb (71.2 kg)   SpO2 98%   BMI 27.81 kg/m  Wt Readings from Last 3 Encounters:  02/11/23 157 lb (71.2 kg)  08/05/22 165 lb (74.8 kg)  04/30/22 172 lb (78 kg)       Assessment & Plan:   Problem List Items Addressed This Visit       Unprioritized   Osteoporosis     On Fosamax, with stable bone density on recent DEXA scan. -Continue Fosamax.      Iron deficiency anemia    Lab Results  Component Value Date   WBC 8.3 08/05/2022   HGB 14.3 08/05/2022   HCT 43.9 08/05/2022   MCV 89.4 08/05/2022   PLT 240.0 08/05/2022   Stable continue iron 1 tab every other day.       Hyperlipidemia    Lab Results  Component Value Date   CHOL 173 08/05/2022   HDL 46.50 08/05/2022   LDLDIRECT 93.0 08/05/2022   TRIG 255.0 (H) 08/05/2022   CHOLHDL 4 08/05/2022   LDL at goal on crestor, discussed diet modifications for elevated triglycerides.       Hyperglycemia    Lab Results  Component Value Date   HGBA1C 6.4 08/05/2022   Will update A1C.      Relevant Orders   HgB A1c   Basic Metabolic Panel (BMET)   Bilateral foot pain - Primary     Chronic, constant pain in both feet, unrelieved by gabapentin. Unclear etiology, possibly neuropathy or arthritis. -Refer to podiatry for further evaluation and management.       Relevant Orders   Ambulatory referral to Podiatry    I have discontinued Michele M. Streett's gabapentin. I am also having her maintain her loratadine, aspirin EC, multivitamin with minerals, diclofenac Sodium, CALCIUM CARBONATE-VITAMIN D PO, furosemide, rosuvastatin, alendronate, Iron (Ferrous Sulfate), and meloxicam.  No orders of the defined types were placed in this encounter.

## 2023-02-12 LAB — BASIC METABOLIC PANEL
BUN: 14 mg/dL (ref 6–23)
CO2: 27 meq/L (ref 19–32)
Calcium: 9.7 mg/dL (ref 8.4–10.5)
Chloride: 103 meq/L (ref 96–112)
Creatinine, Ser: 0.84 mg/dL (ref 0.40–1.20)
GFR: 65.69 mL/min (ref >60.00)
Glucose, Bld: 88 mg/dL (ref 70–99)
Potassium: 4.1 meq/L (ref 3.5–5.1)
Sodium: 140 meq/L (ref 135–145)

## 2023-02-12 LAB — HEMOGLOBIN A1C: Hgb A1c MFr Bld: 5.8 % (ref 4.6–6.5)

## 2023-02-17 ENCOUNTER — Telehealth: Payer: Self-pay | Admitting: Family

## 2023-02-17 NOTE — Telephone Encounter (Signed)
Prescription Request  02/17/2023  Is this a "Controlled Substance" medicine? No  LOV: 02/11/2023  What is the name of the medication or equipment?   rosuvastatin (CRESTOR) 20 MG tablet [[id number]]  Have you contacted your pharmacy to request a refill? No   Which pharmacy would you like this sent to?   [facility] [place] [place] - [address] [facility] AT Portal to Registered [facility] [address] [place] [address] Phone: [phone] Fax: [phone]    Patient notified that their request is being sent to the clinical staff for review and that they should receive a response within 2 business days.   Please advise at Mobile [phone] (mobile)

## 2023-02-20 ENCOUNTER — Other Ambulatory Visit: Payer: Self-pay

## 2023-02-20 MED ORDER — ROSUVASTATIN CALCIUM 20 MG PO TABS: 20.0000 mg | ORAL_TABLET | Freq: Every day | ORAL | 1 refills | Status: DC

## 2023-02-20 NOTE — Telephone Encounter (Signed)
Rx sent 

## 2023-03-16 DIAGNOSIS — M545 Low back pain, unspecified: Secondary | ICD-10-CM | POA: Diagnosis not present

## 2023-03-16 DIAGNOSIS — M7062 Trochanteric bursitis, left hip: Secondary | ICD-10-CM | POA: Diagnosis not present

## 2023-03-16 DIAGNOSIS — M79605 Pain in left leg: Secondary | ICD-10-CM | POA: Diagnosis not present

## 2023-03-16 DIAGNOSIS — M461 Sacroiliitis, not elsewhere classified: Secondary | ICD-10-CM | POA: Diagnosis not present

## 2023-03-16 DIAGNOSIS — Z1330 Encounter for screening examination for mental health and behavioral disorders, unspecified: Secondary | ICD-10-CM | POA: Diagnosis not present

## 2023-04-02 DIAGNOSIS — M4316 Spondylolisthesis, lumbar region: Secondary | ICD-10-CM | POA: Diagnosis not present

## 2023-04-02 DIAGNOSIS — M47816 Spondylosis without myelopathy or radiculopathy, lumbar region: Secondary | ICD-10-CM | POA: Diagnosis not present

## 2023-04-02 DIAGNOSIS — M419 Scoliosis, unspecified: Secondary | ICD-10-CM | POA: Diagnosis not present

## 2023-04-02 DIAGNOSIS — M48061 Spinal stenosis, lumbar region without neurogenic claudication: Secondary | ICD-10-CM | POA: Diagnosis not present

## 2023-04-10 ENCOUNTER — Telehealth: Payer: Self-pay

## 2023-04-10 MED ORDER — ROSUVASTATIN CALCIUM 20 MG PO TABS: 20.0000 mg | ORAL_TABLET | Freq: Every day | ORAL | 1 refills | Status: DC

## 2023-04-10 MED ORDER — OMEPRAZOLE 20 MG PO CPDR: 20.0000 mg | DELAYED_RELEASE_CAPSULE | Freq: Every day | ORAL | 1 refills | Status: DC

## 2023-04-10 MED ORDER — MELOXICAM 7.5 MG PO TABS: 7.5000 mg | ORAL_TABLET | Freq: Every day | ORAL | 3 refills | Status: DC

## 2023-04-10 MED ORDER — FUROSEMIDE 80 MG PO TABS: 80.0000 mg | ORAL_TABLET | Freq: Every day | ORAL | 1 refills | Status: DC

## 2023-04-10 NOTE — Addendum Note (Signed)
 Addended by: Joye Nobles on: 04/10/2023 04:35 PM   Modules accepted: Orders

## 2023-04-10 NOTE — Telephone Encounter (Signed)
 Refills sent to new pharmacy.  Patient reports she just started seeing spine doctor 3 weeks ago. She was advised to follow up with him as he is in the middle of providing treatment and she has follow up appointments with him.

## 2023-04-10 NOTE — Telephone Encounter (Signed)
 Copied from CRM [id number]. Topic: Clinical - Medication Question >> Apr 10, 2023  2:57 PM Chantha C wrote: Reason for CRM: Patient insurance has changed, no longer using [facility], now using [facility]. Patient will call back with the correct pharmacy to send medications.   Patient needs refills on rosuvastatin  (CRESTOR ) 20 MG tablet,  Omeprazole  20 mg, furosemide  (LASIX ) 80 MG tablet, meloxicam  (MOBIC ) 7.5 MG tablet. Is not out of medication yet, but trying to get it in order for the new mail order pharmacy.   Patient saw the spine specialist whom prescribe Nortriptylin 10 mg 1 capsule once daily, and it's working. Patient wants NP, Daryl to keep prescribing so she doesn't have to see the spin specialist.

## 2023-04-30 DIAGNOSIS — M5417 Radiculopathy, lumbosacral region: Secondary | ICD-10-CM | POA: Diagnosis not present

## 2023-05-26 DIAGNOSIS — M5416 Radiculopathy, lumbar region: Secondary | ICD-10-CM | POA: Diagnosis not present

## 2023-06-02 ENCOUNTER — Encounter: Payer: Self-pay | Admitting: Family

## 2023-06-12 ENCOUNTER — Ambulatory Visit: Payer: Medicare HMO | Admitting: Family

## 2023-06-12 VITALS — BP 150/85 | HR 90 | Temp 97.5°F | Resp 16 | Ht 63.0 in | Wt 161.6 lb

## 2023-06-12 DIAGNOSIS — M810 Age-related osteoporosis without current pathological fracture: Secondary | ICD-10-CM

## 2023-06-12 DIAGNOSIS — R739 Hyperglycemia, unspecified: Secondary | ICD-10-CM | POA: Diagnosis not present

## 2023-06-12 DIAGNOSIS — E785 Hyperlipidemia, unspecified: Secondary | ICD-10-CM

## 2023-06-12 DIAGNOSIS — K219 Gastro-esophageal reflux disease without esophagitis: Secondary | ICD-10-CM | POA: Diagnosis not present

## 2023-06-12 LAB — BASIC METABOLIC PANEL WITH GFR
BUN: 18 mg/dL (ref 6–23)
CO2: 29 meq/L (ref 19–32)
Calcium: 9.7 mg/dL (ref 8.4–10.5)
Chloride: 107 meq/L (ref 96–112)
Creatinine, Ser: 0.78 mg/dL (ref 0.40–1.20)
GFR: 71.63 mL/min (ref >60.00)
Glucose, Bld: 96 mg/dL (ref 70–99)
Potassium: 5.1 meq/L (ref 3.5–5.1)
Sodium: 141 meq/L (ref 135–145)

## 2023-06-12 LAB — LIPID PANEL
Cholesterol: 172 mg/dL (ref 0–200)
HDL: 49.9 mg/dL (ref >39.00)
LDL Cholesterol: 82 mg/dL (ref 0–99)
NonHDL: 121.99
Total CHOL/HDL Ratio: 3
Triglycerides: 199 mg/dL — ABNORMAL HIGH (ref 0.0–149.0)
VLDL: 39.8 mg/dL (ref 0.0–40.0)

## 2023-06-12 NOTE — Assessment & Plan Note (Signed)
 Lab Results  Component Value Date   CHOL 173 08/05/2022   HDL 46.50 08/05/2022   LDLDIRECT 93.0 08/05/2022   TRIG 255.0 (H) 08/05/2022   CHOLHDL 4 08/05/2022   Maintained on crestor. Update lipid panel.

## 2023-06-12 NOTE — Assessment & Plan Note (Signed)
 Continues meloxicam.

## 2023-06-12 NOTE — Patient Instructions (Signed)
 VISIT SUMMARY:  During today's visit, we discussed your ongoing hypertension, osteoarthritis, osteoporosis, and gastroesophageal reflux disease (GERD). We also reviewed your current medications and addressed your concerns about stress related to family issues.  YOUR PLAN:  -HYPERTENSION: Hypertension, or high blood pressure, can be influenced by stress. Your blood pressure readings at home are normal, but stress may cause occasional spikes. We will repeat your blood pressure measurement in 6 weeks to monitor your condition.  -OSTEOARTHRITIS: Osteoarthritis is a condition that causes joint pain and stiffness. You are currently taking meloxicam, which is effective for managing your knee pain. Continue taking meloxicam as prescribed.  -OSTEOPOROSIS: Osteoporosis is a condition that weakens bones, making them fragile and more likely to break. You are taking Fosamax for bone health. We need to update your bone density scan, so we will order and schedule this test.  -GASTROESOPHAGEAL REFLUX DISEASE (GERD): GERD is a condition where stomach acid frequently flows back into the tube connecting your mouth and stomach. You are currently taking omeprazole, but we suggest stopping it to see if your symptoms recur. If symptoms return within 3 days, you should restart omeprazole.  -GENERAL HEALTH MAINTENANCE: Your intake of multivitamins and calcium is appropriate. We need to update your cholesterol levels, so we will order a cholesterol level test.  INSTRUCTIONS:  Please schedule a follow-up appointment in 6 weeks for a repeat blood pressure measurement. Additionally, schedule a bone density scan and a cholesterol level test as discussed.

## 2023-06-12 NOTE — Assessment & Plan Note (Signed)
 Continues omeprazole, will trial off.

## 2023-06-12 NOTE — Progress Notes (Signed)
 Subjective:     Patient ID: Michele Williamson, female    DOB: 06-Sep-1940, 83 y.o.   MRN: [id number]  Chief Complaint  Patient presents with   Hypertension    Follow up     Hypertension    Discussed the use of AI scribe software for clinical note transcription with the patient, who gave verbal consent to proceed.  History of Present Illness Michele DEMITRA DANLEY is an 83 year old female with hypertension who presents for a follow-up on blood pressure and medications.  She is experiencing ongoing stress related to conflicts with her fourteen-year-old grandson, which she believes may be affecting her blood pressure. She describes frequent arguments and a challenging home environment, noting that her grandson has been refusing to attend school and has been grounded as a result. He missed four days of school recently due to a sunburn or windburn and subsequent refusal to attend. Her blood pressure readings at [facility] are typically around 117/70, although she acknowledges that her stress levels may be contributing to higher readings at times.  She is currently taking Fosamax once a week for bone health, with her last bone density test conducted in March 2023. She also takes omeprazole daily, although she is uncertain if it is necessary every day. Additionally, she takes meloxicam daily for arthritis, primarily in her knees, and finds it necessary to manage her symptoms. She also takes a multivitamin and calcium supplement regularly.  During the review of symptoms, no new concerns were reported aside from the stress related to her grandson.  BP Readings from Last 3 Encounters:  06/12/23 (!) 150/85  02/11/23 128/70  08/05/22 139/66        Health Maintenance Due  Topic Date Due   Medicare Annual Wellness (AWV)  03/20/2023   COVID-19 Vaccine (9 - 2024-25 season) 06/02/2023    Past Medical History:  Diagnosis Date   Arthritis    shoulder, hands , feet, knees,   Complication of  anesthesia    Heart murmur    Hyperglycemia    Hyperlipemia    Hypertension    PONV (postoperative nausea and vomiting)     Past Surgical History:  Procedure Laterality Date   BUNIONECTOMY Right    CARPAL TUNNEL RELEASE     CATARACT EXTRACTION     CESAREAN SECTION     CHOLECYSTECTOMY  2001   REVERSE SHOULDER ARTHROPLASTY Left 06/28/2020   Procedure: REVERSE SHOULDER ARTHROPLASTY;  Surgeon: Francena Hanly, MD;  Location: [facility];  Service: Orthopedics;  Laterality: Left;    ROTATOR CUFF REPAIR Right     Family History  Problem Relation Age of Onset   Hypertension Mother    Heart disease Mother    Bone cancer Father    Hypertension Father    Hyperlipidemia Father    Melanoma Brother    Stomach cancer Maternal Grandmother    Obesity Son     Social History   Socioeconomic History   Marital status: Widowed    Spouse name: Not on file   Number of children: Not on file   Years of education: Not on file   Highest education level: 12th grade  Occupational History   Not on file  Tobacco Use   Smoking status: Never   Smokeless tobacco: Never  Vaping Use   Vaping status: Never Used  Substance and Sexual Activity   Alcohol use: No   Drug use: No   Sexual activity: Yes    Birth control/protection: None  Other Topics Concern   Not on file  Social History Narrative   Lives in [place]   Has an adopted son born Jul 04, 2008   Husband died in 2006-07-05   Retired, worked as an Insurance underwriter   Enjo[facility] senior center   Has a Veterinary surgeon   Completed HS   Social Drivers of Corporate investment banker Strain: Low Risk  (08/04/2022)   Overall Financial Resource Strain (CARDIA)    Difficulty of Paying Living Expenses: Not hard at all  Food Insecurity: No Food Insecurity (08/04/2022)   Hunger Vital Sign    Worried About Running Out of Food in the Last Year: Never true    Ran Out of Food in the Last Year: Never true  Transportation Needs: No Transportation Needs (08/04/2022)   PRAPARE  - Administrator, Civil Service (Medical): No    Lack of Transportation (Non-Medical): No  Physical Activity: Unknown (08/04/2022)   Exercise Vital Sign    Days of Exercise per Week: 0 days    Minutes of Exercise per Session: Not on file  Stress: Stress Concern Present (08/04/2022)   [employer] of Occupational Health - Occupational Stress Questionnaire    Feeling of Stress : Rather much  Social Connections: Unknown (11/14/2022)   Received from [facility]   Social Network    Social Network: Not on file  Intimate Partner Violence: Unknown (11/14/2022)   Received from [facility]   HITS    Physically Hurt: Not on file    Insult or Talk Down To: Not on file    Threaten Physical Harm: Not on file    Scream or Curse: Not on file    Outpatient Medications Prior to Visit  Medication Sig Dispense Refill   alendronate (FOSAMAX) 70 MG tablet Take 1 tablet (70 mg total) by mouth every 7 (seven) days. Take with a full glass of water on an empty stomach. 12 tablet 4   aspirin EC 81 MG tablet Take 1 tablet (81 mg total) by mouth daily. Swallow whole. 90 tablet 3   CALCIUM CARBONATE-VITAMIN D PO Take 1 tablet by mouth daily. 600-400 mg     diclofenac Sodium (VOLTAREN) 1 % GEL Apply 2 g topically 4 (four) times daily as needed. 100 g 0   furosemide (LASIX) 80 MG tablet Take 1 tablet (80 mg total) by mouth daily. 90 tablet 1   Iron, Ferrous Sulfate, 325 (65 Fe) MG TABS Take 325 mg by mouth every other day. 30 tablet    loratadine (CLARITIN) 10 MG tablet Take 10 mg by mouth daily. [employer]     meloxicam (MOBIC) 7.5 MG tablet Take 1 tablet (7.5 mg total) by mouth daily. 90 tablet 3   Multiple Vitamins-Minerals (MULTIVITAMIN WITH MINERALS) tablet Take 1 tablet by mouth daily.     omeprazole (PRILOSEC) 20 MG capsule Take 1 capsule (20 mg total) by mouth daily. 90 capsule 1   rosuvastatin (CRESTOR) 20 MG tablet Take 1 tablet (20 mg total) by mouth at bedtime. 90 tablet 1   No  facility-administered medications prior to visit.    Allergies  Allergen Reactions   Oxycodone-Acetaminophen Nausea Only   Penicillins     Childhood Pt received Ancef on 06-28-20 and tolerated it well   Pregabalin     Dizziness   Tramadol Nausea And Vomiting    ROS See HPI     Objective:    Physical Exam Constitutional:      General: She is  not in acute distress.    Appearance: Normal appearance. She is well-developed.  HENT:     Head: Normocephalic and atraumatic.     Right Ear: External ear normal.     Left Ear: External ear normal.  Eyes:     General: No scleral icterus. Neck:     Thyroid: No thyromegaly.  Cardiovascular:     Rate and Rhythm: Normal rate and regular rhythm.     Heart sounds: Normal heart sounds. No murmur heard. Pulmonary:     Effort: Pulmonary effort is normal. No respiratory distress.     Breath sounds: Normal breath sounds. No wheezing.  Musculoskeletal:     Cervical back: Neck supple.  Skin:    General: Skin is warm and dry.  Neurological:     Mental Status: She is alert and oriented to person, place, and time.  Psychiatric:        Mood and Affect: Mood normal.        Behavior: Behavior normal.        Thought Content: Thought content normal.        Judgment: Judgment normal.      BP (!) 150/85   Pulse 90   Temp (!) 97.5 F (36.4 C) (Oral)   Resp 16   Ht 5\' 3"  (1.6 m)   Wt 161 lb 9.6 oz (73.3 kg)   SpO2 99%   BMI 28.63 kg/m  Wt Readings from Last 3 Encounters:  06/12/23 161 lb 9.6 oz (73.3 kg)  02/11/23 157 lb (71.2 kg)  08/05/22 165 lb (74.8 kg)       Assessment & Plan:   Problem List Items Addressed This Visit       Unprioritized   Osteoporosis - Primary   On weekly fosamax.       Relevant Orders   DG Bone Density   Hyperlipidemia   Lab Results  Component Value Date   CHOL 173 08/05/2022   HDL 46.50 08/05/2022   LDLDIRECT 93.0 08/05/2022   TRIG 255.0 (H) 08/05/2022   CHOLHDL 4 08/05/2022   Maintained on  crestor. Update lipid panel.       Relevant Orders   Lipid panel   Basic Metabolic Panel (BMET)   Hyperglycemia   Lab Results  Component Value Date   HGBA1C 5.8 02/11/2023   Last A1C was WNL.      Esophageal reflux   Continues omeprazole, will trial off.        I am having Michele M. Cogan maintain her loratadine, aspirin EC, multivitamin with minerals, diclofenac Sodium, CALCIUM CARBONATE-VITAMIN D PO, alendronate, Iron (Ferrous Sulfate), furosemide, meloxicam, rosuvastatin, and omeprazole.  No orders of the defined types were placed in this encounter.

## 2023-06-12 NOTE — Assessment & Plan Note (Signed)
On weekly fosamax

## 2023-06-12 NOTE — Assessment & Plan Note (Signed)
 Lab Results  Component Value Date   HGBA1C 5.8 02/11/2023   Last A1C was WNL.

## 2023-06-13 ENCOUNTER — Encounter: Payer: Self-pay | Admitting: Family

## 2023-06-23 DIAGNOSIS — Z1231 Encounter for screening mammogram for malignant neoplasm of breast: Secondary | ICD-10-CM | POA: Diagnosis not present

## 2023-06-23 LAB — HM MAMMOGRAPHY

## 2023-07-01 ENCOUNTER — Encounter: Payer: Self-pay | Admitting: Family

## 2023-07-01 DIAGNOSIS — M810 Age-related osteoporosis without current pathological fracture: Secondary | ICD-10-CM

## 2023-07-06 DIAGNOSIS — M2042 Other hammer toe(s) (acquired), left foot: Secondary | ICD-10-CM | POA: Diagnosis not present

## 2023-07-06 DIAGNOSIS — M79671 Pain in right foot: Secondary | ICD-10-CM | POA: Diagnosis not present

## 2023-07-06 DIAGNOSIS — M2012 Hallux valgus (acquired), left foot: Secondary | ICD-10-CM | POA: Diagnosis not present

## 2023-07-06 DIAGNOSIS — M79672 Pain in left foot: Secondary | ICD-10-CM | POA: Diagnosis not present

## 2023-07-06 DIAGNOSIS — M205X2 Other deformities of toe(s) (acquired), left foot: Secondary | ICD-10-CM | POA: Insufficient documentation

## 2023-07-06 DIAGNOSIS — M2022 Hallux rigidus, left foot: Secondary | ICD-10-CM | POA: Insufficient documentation

## 2023-07-08 DIAGNOSIS — M810 Age-related osteoporosis without current pathological fracture: Secondary | ICD-10-CM | POA: Diagnosis not present

## 2023-07-08 DIAGNOSIS — M8588 Other specified disorders of bone density and structure, other site: Secondary | ICD-10-CM | POA: Diagnosis not present

## 2023-07-08 LAB — HM DEXA SCAN

## 2023-07-10 ENCOUNTER — Encounter: Payer: Self-pay | Admitting: Family

## 2023-07-10 DIAGNOSIS — M8580 Other specified disorders of bone density and structure, unspecified site: Secondary | ICD-10-CM | POA: Insufficient documentation

## 2023-07-15 ENCOUNTER — Encounter: Payer: Self-pay | Admitting: Family Medicine

## 2023-07-15 ENCOUNTER — Ambulatory Visit: Payer: Self-pay

## 2023-07-15 ENCOUNTER — Ambulatory Visit: Admitting: Family Medicine

## 2023-07-15 VITALS — BP 134/82 | HR 102 | Temp 98.0°F | Resp 16 | Ht 63.0 in | Wt 161.0 lb

## 2023-07-15 DIAGNOSIS — L255 Unspecified contact dermatitis due to plants, except food: Secondary | ICD-10-CM | POA: Diagnosis not present

## 2023-07-15 DIAGNOSIS — R030 Elevated blood-pressure reading, without diagnosis of hypertension: Secondary | ICD-10-CM | POA: Diagnosis not present

## 2023-07-15 MED ORDER — TRIAMCINOLONE ACETONIDE 0.1 % EX OINT: 1.0000 | TOPICAL_OINTMENT | Freq: Two times a day (BID) | CUTANEOUS | 0 refills | Status: DC

## 2023-07-15 NOTE — Telephone Encounter (Signed)
 Copied from CRM [id number]. Topic: Clinical - Red Word Triage >> Jul 15, 2023 12:13 PM Michele Williamson wrote: Red Word that prompted transfer to Nurse Triage: headache/ blood pressure is 168/83 - pt advise pcp took her off BP medicine but fear she may need to get back on   Chief Complaint: Elevated blood pressure  Symptoms: High blood pressure, headache  Frequency: Constant  Disposition: [] ED /[] Urgent Care (no appt availability in office) / [x] Appointment(In office/virtual)/ []  [facility]/ [] Home Care/ [] Refused Recommended Disposition /[] [facility]/ []  Follow-up with PCP Additional Notes: Patient states she went to [facility] today to check her blood pressure and it was 168/83. She states that she was taken off of her blood pressure medication about 6 months ago and believes she may need to be put back on it. She states she is also experiencing a headache that began yesterday. Appointment made for the patient today for evaluation of her symptoms.     Reason for Disposition  Systolic BP  >= 160 OR Diastolic >= 100  Answer Assessment - Initial Assessment Questions 1. BLOOD PRESSURE: "What is the blood pressure?" "Did you take at least two measurements 5 minutes apart?"     168/83 2. ONSET: "When did you take your blood pressure?"     Today  3. HOW: "How did you take your blood pressure?" (e.g., automatic home BP monitor, visiting nurse)     Automatic BP cuff at [employer]  4. HISTORY: "Do you have a history of high blood pressure?"     Yes 5. MEDICINES: "Are you taking any medicines for blood pressure?" "Have you missed any doses recently?"     No, recently taken off of them  6. OTHER SYMPTOMS: "Do you have any symptoms?" (e.g., blurred vision, chest pain, difficulty breathing, headache, weakness)     Headache  Protocols used: Blood Pressure - High-A-AH

## 2023-07-15 NOTE — Progress Notes (Signed)
 Chief Complaint  Patient presents with   Hypertension    Discuss BP    Subjective Michele  Williamson Stable [place]sefa is a 83 y.o. female who presents for high blood pressure. She does monitor home blood pressures. Blood pressures ranging from 130-40's/70 we take any medicines routinely.  Blood pressure's on average. She is compliant with medications She is adhering to a healthy diet overall. Current exercise: limited No CP or SOB.   Patient was exposed to poison oak recently.  Her son also has this and he was doing yard work for her recently.  It does itch.  She had prednisone called in but has not started it yet.  No pain, drainage, spreading.  It is on both of her forearms.   Past Medical History:  Diagnosis Date   Arthritis    shoulder, hands , feet, knees,   Complication of anesthesia    Heart murmur    Hyperglycemia    Hyperlipemia    Hypertension    Osteopenia    PONV (postoperative nausea and vomiting)     Exam BP 134/82 (BP Location: Left Arm, Cuff Size: Normal)   Pulse (!) 102   Temp 98 F (36.7 C) (Oral)   Resp 16   Ht 5\' 3"  (1.6 m)   Wt 161 lb (73 kg)   SpO2 98%   BMI 28.52 kg/m  General:  well developed, well nourished, in no apparent distress Heart: RRR, no bruits, no LE edema Lungs: clear to auscultation, no accessory muscle use Skin: Pinkish and slightly edematous patches noted on the dorsum of her forearms about midway down.  Slight scaling.  No fluid-filled lesions.  No excoriations, TTP, drainage. Psych: well oriented with normal range of affect and appropriate judgment/insight  Elevated blood pressure reading  Rhus dermatitis - Plan: triamcinolone  ointment (KENALOG ) 0.1 %  Recheck is reassuring.  Counseled on diet and exercise. She has prednisone at home.  Says it feels manageable now having not taken the medication.  Topical medicine sent in as a possible alternative if it is a more of a mild case.  Try not to scratch.  Cold compresses/packs recommended.   Consider an oral antihistamine. F/u with regular PCP as originally scheduled. The patient voiced understanding and agreement to the plan.  Shellie Dials [place], DO 07/15/23  2:30 PM

## 2023-07-15 NOTE — Patient Instructions (Signed)
 Try not to scratch as this can make things worse. Avoid scented products while dealing with this. You may resume when the itchiness resolves. Cold/cool compresses can help.   Continue to monitor blood pressure intermittently.   Let us  know if you need anything.

## 2023-07-21 ENCOUNTER — Ambulatory Visit: Admitting: Family

## 2023-07-30 ENCOUNTER — Encounter

## 2023-08-31 ENCOUNTER — Ambulatory Visit: Payer: Self-pay

## 2023-08-31 NOTE — Telephone Encounter (Signed)
 FYI Only or Action Required?: FYI only for provider.  Patient was last seen in primary care on 07/15/2023 by Frann Mabel Mt, DO. Called Nurse Triage reporting Hypertension. Symptoms began several days ago. Interventions attempted: Nothing. Symptoms are: unchanged.  Triage Disposition: See Physician Within 24 Hours  Patient/caregiver understands and will follow disposition?: Yes                 Copied from CRM [id number]. Topic: Clinical - Red Word Triage >> Aug 31, 2023  8:06 AM Mesmerise C wrote: [facility] that prompted transfer to Nurse Triage: Patient stated she had a fall over the weekend, has pain in her foot and knee and experiencing high blood pressure Reason for Disposition  Systolic BP  >= 180 OR Diastolic >= 110  Answer Assessment - Initial Assessment Questions 1. BLOOD PRESSURE: What is the blood pressure? Did you take at least two measurements 5 minutes apart?     170/84 2. ONSET: When did you take your blood pressure?     Saturday 3. HOW: How did you take your blood pressure? (e.g., automatic home BP monitor, visiting nurse)     [facility], automatic 4. HISTORY: Do you have a history of high blood pressure?     yes 5. MEDICINES: Are you taking any medicines for blood pressure? Have you missed any doses recently?     No, states meds were stopped because bp got too low 6. OTHER SYMPTOMS: Do you have any symptoms? (e.g., blurred vision, chest pain, difficulty breathing, headache, weakness)     no 7. PREGNANCY: Is there any chance you are pregnant? When was your last menstrual period?     no  Protocols used: Blood Pressure - High-A-AH

## 2023-09-01 ENCOUNTER — Ambulatory Visit: Admitting: Family

## 2023-09-01 ENCOUNTER — Other Ambulatory Visit (HOSPITAL_BASED_OUTPATIENT_CLINIC_OR_DEPARTMENT_OTHER): Payer: Self-pay

## 2023-09-01 VITALS — BP 169/88 | HR 125 | Temp 98.0°F | Resp 17 | Ht 63.0 in | Wt 159.0 lb

## 2023-09-01 DIAGNOSIS — I10 Essential (primary) hypertension: Secondary | ICD-10-CM | POA: Diagnosis not present

## 2023-09-01 DIAGNOSIS — K5900 Constipation, unspecified: Secondary | ICD-10-CM | POA: Diagnosis not present

## 2023-09-01 DIAGNOSIS — R000 Tachycardia, unspecified: Secondary | ICD-10-CM

## 2023-09-01 HISTORY — DX: Tachycardia, unspecified: R00.0

## 2023-09-01 MED ORDER — METOPROLOL SUCCINATE ER 50 MG PO TB24
50.0000 mg | ORAL_TABLET | Freq: Every day | ORAL | 0 refills | Status: DC
  Filled 2023-09-01: qty 30, 30d supply, fill #0

## 2023-09-01 NOTE — Assessment & Plan Note (Signed)
  Constipation Bowel movements two to three times a week. Previous laxative use caused diarrhea. MiraLAX recommended. - Recommend MiraLAX, starting with a capful and adjusting as needed to avoid diarrhea.

## 2023-09-01 NOTE — Assessment & Plan Note (Signed)
 Uncontrolled.  Add toprol  xl 50mg . OK to split lasix  80 mg into 40mg  bid to see if this is more tolerable regimen with urination for her.

## 2023-09-01 NOTE — Progress Notes (Addendum)
 Subjective:     Patient ID: Michele Williamson, female    DOB: 06/17/1940, 83 y.o.   MRN: [id number]  Chief Complaint  Patient presents with   Hypertension    Says she checked her bp the other day and it was pretty high    Fall    2nd fall in two weeks and did get hurt    HPI  Discussed the use of AI scribe software for clinical note transcription with the patient, who gave verbal consent to proceed.  History of Present Illness Michele Williamson is an 83 year old female who presents with episodes of sudden falls and transient vision changes.  She has experienced two episodes of sudden falls. The first occurred without preceding symptoms, and she does not recall losing consciousness. During the second episode, she experienced a sensation of 'a curtain goes in front of me' with everything turning white before falling. These episodes are brief, lasting only a second, and she sometimes senses them coming on. There is no prolonged loss of consciousness.  She experiences transient vision changes described as 'everything goes white' before falling. Her heart rate is elevated at 120 beats per minute. She is on a diuretic, which causes frequent urination for several hours after taking it. She has adjusted the timing of the dose to manage this side effect.   Health Maintenance Due  Topic Date Due   Medicare Annual Wellness (AWV)  03/20/2023   COVID-19 Vaccine (9 - 2024-25 season) 06/02/2023    Past Medical History:  Diagnosis Date   Arthritis    shoulder, hands , feet, knees,   Complication of anesthesia    Heart murmur    Hyperglycemia    Hyperlipemia    Hypertension    Osteopenia    PONV (postoperative nausea and vomiting)     Past Surgical History:  Procedure Laterality Date   BUNIONECTOMY Right    CARPAL TUNNEL RELEASE     CATARACT EXTRACTION     CESAREAN SECTION     CHOLECYSTECTOMY  Oct 06, 1999   REVERSE SHOULDER ARTHROPLASTY Left 06/28/2020   Procedure: REVERSE SHOULDER  ARTHROPLASTY;  Surgeon: Melita Drivers, MD;  Location: [facility];  Service: Orthopedics;  Laterality: Left;    ROTATOR CUFF REPAIR Right     Family History  Problem Relation Age of Onset   Hypertension Mother    Heart disease Mother    Bone cancer Father    Hypertension Father    Hyperlipidemia Father    Melanoma Brother    Stomach cancer Maternal Grandmother    Obesity Son     Social History   Socioeconomic History   Marital status: Widowed    Spouse name: Not on file   Number of children: Not on file   Years of education: Not on file   Highest education level: 12th grade  Occupational History   Not on file  Tobacco Use   Smoking status: Never   Smokeless tobacco: Never  Vaping Use   Vaping status: Never Used  Substance and Sexual Activity   Alcohol use: No   Drug use: No   Sexual activity: Yes    Birth control/protection: None  Other Topics Concern   Not on file  Social History Narrative   Lives in [place]   Has an adopted son born 2008-10-05   Husband died in 10/06/06   Retired, worked as an Insurance underwriter   Enjoys senior center   Has a Veterinary surgeon   Completed HS  Social Drivers of Corporate investment banker Strain: Low Risk  (08/04/2022)   Overall Financial Resource Strain (CARDIA)    Difficulty of Paying Living Expenses: Not hard at all  Food Insecurity: No Food Insecurity (08/04/2022)   Hunger Vital Sign    Worried About Running Out of Food in the Last Year: Never true    Ran Out of Food in the Last Year: Never true  Transportation Needs: No Transportation Needs (08/04/2022)   PRAPARE - Administrator, Civil Service (Medical): No    Lack of Transportation (Non-Medical): No  Physical Activity: Unknown (08/04/2022)   Exercise Vital Sign    Days of Exercise per Week: 0 days    Minutes of Exercise per Session: Not on file  Stress: Stress Concern Present (08/04/2022)   [employer] of Occupational Health - Occupational Stress Questionnaire     Feeling of Stress : Rather much  Social Connections: Unknown (11/14/2022)   Received from [facility]   Social Network    Social Network: Not on file  Intimate Partner Violence: Unknown (11/14/2022)   Received from [facility]   HITS    Physically Hurt: Not on file    Insult or Talk Down To: Not on file    Threaten Physical Harm: Not on file    Scream or Curse: Not on file    Outpatient Medications Prior to Visit  Medication Sig Dispense Refill   alendronate  (FOSAMAX ) 70 MG tablet Take 1 tablet (70 mg total) by mouth every 7 (seven) days. Take with a full glass of water on an empty stomach. 12 tablet 4   aspirin  EC 81 MG tablet Take 1 tablet (81 mg total) by mouth daily. Swallow whole. 90 tablet 3   CALCIUM  CARBONATE-VITAMIN D  PO Take 1 tablet by mouth daily. 600-400 mg     diclofenac  Sodium (VOLTAREN ) 1 % GEL Apply 2 g topically 4 (four) times daily as needed. 100 g 0   furosemide  (LASIX ) 80 MG tablet Take 1 tablet (80 mg total) by mouth daily. 90 tablet 1   Iron , Ferrous Sulfate , 325 (65 Fe) MG TABS Take 325 mg by mouth every other day. 30 tablet    loratadine (CLARITIN) 10 MG tablet Take 10 mg by mouth daily. [employer]     meloxicam  (MOBIC ) 7.5 MG tablet Take 1 tablet (7.5 mg total) by mouth daily. 90 tablet 3   Multiple Vitamins-Minerals (MULTIVITAMIN WITH MINERALS) tablet Take 1 tablet by mouth daily.     omeprazole  (PRILOSEC) 20 MG capsule Take 1 capsule (20 mg total) by mouth daily. 90 capsule 1   rosuvastatin  (CRESTOR ) 20 MG tablet Take 1 tablet (20 mg total) by mouth at bedtime. 90 tablet 1   triamcinolone  ointment (KENALOG ) 0.1 % Apply 1 Application topically 2 (two) times daily. (Patient not taking: Reported on 09/01/2023) 30 g 0   No facility-administered medications prior to visit.    Allergies  Allergen Reactions   Oxycodone -Acetaminophen  Nausea Only   Penicillins     Childhood Pt received Ancef  on 06-28-20 and tolerated it well   Pregabalin     Dizziness    Tramadol Nausea And Vomiting    ROS See HPI    Objective:    Physical Exam Constitutional:      General: She is not in acute distress.    Appearance: Normal appearance. She is well-developed.  HENT:     Head: Normocephalic and atraumatic.     Right Ear: External ear normal.  Left Ear: External ear normal.   Eyes:     General: No scleral icterus.  Neck:     Thyroid : No thyromegaly.   Cardiovascular:     Rate and Rhythm: Regular rhythm. Tachycardia present.     Heart sounds: Normal heart sounds. No murmur heard. Pulmonary:     Effort: Pulmonary effort is normal. No respiratory distress.     Breath sounds: Normal breath sounds. No wheezing.   Musculoskeletal:     Cervical back: Neck supple.     Right lower leg: No edema.     Left lower leg: No edema.   Skin:    General: Skin is warm and dry.   Neurological:     Mental Status: She is alert and oriented to person, place, and time.   Psychiatric:        Mood and Affect: Mood normal.        Behavior: Behavior normal.        Thought Content: Thought content normal.        Judgment: Judgment normal.      BP (!) 169/88 (BP Location: Left Arm, Patient Position: Standing, Cuff Size: Normal)   Pulse (!) 125   Temp 98 F (36.7 C) (Oral)   Resp 17   Ht 5' 3 (1.6 m)   Wt 159 lb (72.1 kg)   SpO2 96%   BMI 28.17 kg/m  Wt Readings from Last 3 Encounters:  09/01/23 159 lb (72.1 kg)  07/15/23 161 lb (73 kg)  06/12/23 161 lb 9.6 oz (73.3 kg)       Assessment & Plan:   Problem List Items Addressed This Visit       Unprioritized   Tachycardia - Primary   EKG performed and personally reviewed- noted sinus tach and LBBB.  I reviewed her case and EKG with Dr. Les covering for Dr. Monetta who is out on leave.  He felt that her EKG was consistent with previous EKG (NSR with LBBB).  He recommended orthostatics. No significant orthostasis noted.  Will initiate Toprol  xl for bp control and HR.  She is advised to call  [phone] if recurrent fall/syncope.        Relevant Medications   metoprolol  succinate (TOPROL -XL) 50 MG 24 hr tablet   Other Relevant Orders   EKG 12-Lead (Completed)   Comp Met (CMET)   CBC w/Diff   Hypertension   Uncontrolled.  Add toprol  xl 50mg . OK to split lasix  80 mg into 40mg  bid to see if this is more tolerable regimen with urination for her.       Relevant Medications   metoprolol  succinate (TOPROL -XL) 50 MG 24 hr tablet   Other Relevant Orders   Comp Met (CMET)   CBC w/Diff   Constipation    Constipation Bowel movements two to three times a week. Previous laxative use caused diarrhea. MiraLAX recommended. - Recommend MiraLAX, starting with a capful and adjusting as needed to avoid diarrhea.      In addition to time spent interpreting EKG, 40 minutes were spent on today's visit:  Time was spent reviewing case with cardiologist, reviewing medical record and formulating medical plan.   I have discontinued Morayma  M. Kinnick's triamcinolone  ointment. I am also having her start on metoprolol  succinate. Additionally, I am having her maintain her loratadine, aspirin  EC, multivitamin with minerals, diclofenac  Sodium, CALCIUM  CARBONATE-VITAMIN D  PO, alendronate , Iron  (Ferrous Sulfate ), furosemide , meloxicam , rosuvastatin , and omeprazole .  Meds ordered this encounter  Medications   metoprolol  succinate (  TOPROL -XL) 50 MG 24 hr tablet    Sig: Take 1 tablet (50 mg total) by mouth daily. Take with or immediately following a meal.    Dispense:  30 tablet    Refill:  0    Supervising Provider:   DOMENICA BLACKBIRD A [[id number]]

## 2023-09-01 NOTE — Patient Instructions (Signed)
 VISIT SUMMARY:  Today, we addressed your recent episodes of sudden falls and vision changes, elevated heart rate, high blood pressure, diuretic management, constipation, and low iron  levels. We have made several adjustments to your medications and scheduled follow-up appointments to monitor your progress.  YOUR PLAN:  SYNCOPE: You have experienced two episodes of sudden falls with vision changes. We suspect this may be related to your heart. -We have ordered an EKG to check your heart's activity. -You will be referred to a cardiologist for further evaluation. -If you experience another episode, please visit the emergency room immediately.  TACHYCARDIA: Your heart rate is elevated at 120 beats per minute, which can strain your heart. -We have prescribed medication to lower your blood pressure and heart rate. -The prescription has been sent to your pharmacy for immediate pickup.  HYPERTENSION: Your blood pressure is high, and we need to manage it to prevent complications. -We have prescribed medication to lower your blood pressure. -We will monitor your blood pressure response to the medication.  DIURETIC MANAGEMENT: You are experiencing frequent urination after taking your diuretic medication. -We suggest adjusting your diuretic dose to 40 mg in the morning and 40 mg in the afternoon to improve tolerability.  CONSTIPATION: You have bowel movements two to three times a week, and previous laxatives caused diarrhea. -We recommend starting MiraLAX with a capful and adjusting as needed to avoid diarrhea.  IRON  DEFICIENCY: You have low iron  levels, which has affected your ability to donate blood. -We have ordered blood work to check your iron  levels.  FOLLOW-UP: We need to reassess your heart rate and blood pressure. -Please schedule a follow-up appointment in one week to recheck your heart rate and blood pressure.

## 2023-09-01 NOTE — Assessment & Plan Note (Signed)
 EKG performed and personally reviewed- noted sinus tach and LBBB.  I reviewed her case and EKG with Dr. Les covering for Dr. Monetta who is out on leave.  He felt that her EKG was consistent with previous EKG (NSR with LBBB).  He recommended orthostatics. No significant orthostasis noted.  Will initiate Toprol  xl for bp control and HR.  She is advised to call 911 if recurrent fall/syncope.

## 2023-09-02 ENCOUNTER — Encounter: Payer: Self-pay | Admitting: Family

## 2023-09-02 LAB — CBC WITH DIFFERENTIAL/PLATELET
Basophils Absolute: 0.1 10*3/uL (ref 0.0–0.1)
Basophils Relative: 0.5 % (ref 0.0–3.0)
Eosinophils Absolute: 0.2 10*3/uL (ref 0.0–0.7)
Eosinophils Relative: 1.2 % (ref 0.0–5.0)
HCT: 45.6 % (ref 36.0–46.0)
Hemoglobin: 15 g/dL (ref 12.0–15.0)
Lymphocytes Relative: 16.9 % (ref 12.0–46.0)
Lymphs Abs: 2.5 10*3/uL (ref 0.7–4.0)
MCHC: 32.9 g/dL (ref 30.0–36.0)
MCV: 90.5 fl (ref 78.0–100.0)
Monocytes Absolute: 1.2 10*3/uL — ABNORMAL HIGH (ref 0.1–1.0)
Monocytes Relative: 8.1 % (ref 3.0–12.0)
Neutro Abs: 10.8 10*3/uL — ABNORMAL HIGH (ref 1.4–7.7)
Neutrophils Relative %: 73.3 % (ref 43.0–77.0)
Platelets: 293 10*3/uL (ref 150.0–400.0)
RBC: 5.04 Mil/uL (ref 3.87–5.11)
RDW: 14.2 % (ref 11.5–15.5)
WBC: 14.7 10*3/uL — ABNORMAL HIGH (ref 4.0–10.5)

## 2023-09-02 LAB — COMPREHENSIVE METABOLIC PANEL WITH GFR
ALT: 17 U/L (ref 0–35)
AST: 27 U/L (ref 0–37)
Albumin: 4.9 g/dL (ref 3.5–5.2)
Alkaline Phosphatase: 66 U/L (ref 39–117)
BUN: 26 mg/dL — ABNORMAL HIGH (ref 6–23)
CO2: 27 meq/L (ref 19–32)
Calcium: 10.3 mg/dL (ref 8.4–10.5)
Chloride: 101 meq/L (ref 96–112)
Creatinine, Ser: 1.07 mg/dL (ref 0.40–1.20)
GFR: 48.94 mL/min — ABNORMAL LOW (ref >60.00)
Glucose, Bld: 126 mg/dL — ABNORMAL HIGH (ref 70–99)
Potassium: 3.9 meq/L (ref 3.5–5.1)
Sodium: 140 meq/L (ref 135–145)
Total Bilirubin: 0.5 mg/dL (ref 0.2–1.2)
Total Protein: 8 g/dL (ref 6.0–8.3)

## 2023-09-09 ENCOUNTER — Ambulatory Visit (INDEPENDENT_AMBULATORY_CARE_PROVIDER_SITE_OTHER): Admitting: Family

## 2023-09-09 ENCOUNTER — Encounter: Payer: Self-pay | Admitting: Family

## 2023-09-09 VITALS — BP 160/65 | HR 53 | Temp 98.2°F | Resp 18 | Ht 63.0 in | Wt 165.6 lb

## 2023-09-09 DIAGNOSIS — M79671 Pain in right foot: Secondary | ICD-10-CM

## 2023-09-09 DIAGNOSIS — M79672 Pain in left foot: Secondary | ICD-10-CM | POA: Diagnosis not present

## 2023-09-09 DIAGNOSIS — I10 Essential (primary) hypertension: Secondary | ICD-10-CM

## 2023-09-09 MED ORDER — LISINOPRIL 10 MG PO TABS: ORAL_TABLET | ORAL | 1 refills | Status: DC

## 2023-09-09 NOTE — Assessment & Plan Note (Signed)
New.  Refer to podiatry 

## 2023-09-09 NOTE — Assessment & Plan Note (Signed)
  Blood pressure elevated to 160/65. Metoprolol  discontinued due to side effects. Lisinopril  to be reintroduced with monitoring for hyperkalemia. - withhold lisinopril  if SBP <110. - Schedule follow-up in three weeks for BP and lab recheck. - Instruct to monitor BP at home, report if consistently >160.

## 2023-09-09 NOTE — Progress Notes (Signed)
 Subjective:     Patient ID: Michele Williamson  Michele Williamson Michele Williamson Williamson, female    DOB: Apr 11, 1940, 83 y.o.   MRN: [id number]  Chief Complaint  Patient presents with   Hypertension   Follow-up    HPI  Discussed the use of AI scribe software for clinical note transcription with the patient, who gave verbal consent to proceed.  History of Present Illness   Michele Williamson  CHRISTELLA Michele Williamson Williamson is an 83 year old female who presents with high blood pressure and heart rate issues.  She experiences fluctuations in blood pressure and heart rate. Previously on metoprolol , she discontinued it due to extreme fatigue and a low pulse of 51. Her blood pressure was 158/48 on Monday, with uncertainty about metoprolol  intake that morning. She did not take it on Tuesday. Her heart rate has returned to 53, an improvement from over 100. She is not on any blood pressure medication, with a recent reading of 144/62.  She requests a referral to a foot doctor due to significant pain in both feet. She has not previously consulted a foot doctor.    Health Maintenance Due  Topic Date Due   Medicare Annual Wellness (AWV)  03/20/2023   COVID-19 Vaccine (9 - 2024-25 season) 06/02/2023    Past Medical History:  Diagnosis Date   Arthritis    shoulder, hands , feet, knees,   Complication of anesthesia    Heart murmur    Hyperglycemia    Hyperlipemia    Hypertension    Osteopenia    PONV (postoperative nausea and vomiting)     Past Surgical History:  Procedure Laterality Date   BUNIONECTOMY Right    CARPAL TUNNEL RELEASE     CATARACT EXTRACTION     CESAREAN SECTION     CHOLECYSTECTOMY  1999-10-05   REVERSE SHOULDER ARTHROPLASTY Left 06/28/2020   Procedure: REVERSE SHOULDER ARTHROPLASTY;  Surgeon: Melita Drivers, MD;  Location: [facility];  Service: Orthopedics;  Laterality: Left;    ROTATOR CUFF REPAIR Right     Family History  Problem Relation Age of Onset   Hypertension Mother    Heart disease Mother    Bone cancer Father     Hypertension Father    Hyperlipidemia Father    Melanoma Brother    Stomach cancer Maternal Grandmother    Obesity Son     Social History   Socioeconomic History   Marital status: Widowed    Spouse name: Not on file   Number of children: Not on file   Years of education: Not on file   Highest education level: 12th grade  Occupational History   Not on file  Tobacco Use   Smoking status: Never   Smokeless tobacco: Never  Vaping Use   Vaping status: Never Used  Substance and Sexual Activity   Alcohol use: No   Drug use: No   Sexual activity: Yes    Birth control/protection: None  Other Topics Concern   Not on file  Social History Narrative   Lives in [place]   Has an adopted son born 10/04/08   Husband died in 10-05-2006   Retired, worked as an Insurance underwriter   Enjo[facility] senior center   Has a Veterinary surgeon   Completed HS   Social Drivers of Corporate investment banker Strain: Low Risk  (08/04/2022)   Overall Financial Resource Strain (CARDIA)    Difficulty of Paying Living Expenses: Not hard at all  Food Insecurity: No Food Insecurity (08/04/2022)   Hunger Vital Sign  Worried About Programme researcher, broadcasting/film/video in the Last Year: Never true    Ran Out of Food in the Last Year: Never true  Transportation Needs: No Transportation Needs (08/04/2022)   PRAPARE - Administrator, Civil Service (Medical): No    Lack of Transportation (Non-Medical): No  Physical Activity: Unknown (08/04/2022)   Exercise Vital Sign    Days of Exercise per Week: 0 days    Minutes of Exercise per Session: Not on file  Stress: Stress Concern Present (08/04/2022)   [employer] of Occupational Health - Occupational Stress Questionnaire    Feeling of Stress : Rather much  Social Connections: Unknown (11/14/2022)   Received from [facility]   Social Network    Social Network: Not on file  Intimate Partner Violence: Unknown (11/14/2022)   Received from [facility]   HITS    Physically Hurt: Not on  file    Insult or Talk Down To: Not on file    Threaten Physical Harm: Not on file    Scream or Curse: Not on file    Outpatient Medications Prior to Visit  Medication Sig Dispense Refill   alendronate  (FOSAMAX ) 70 MG tablet Take 1 tablet (70 mg total) by mouth every 7 (seven) days. Take with a full glass of water on an empty stomach. 12 tablet 4   aspirin  EC 81 MG tablet Take 1 tablet (81 mg total) by mouth daily. Swallow whole. 90 tablet 3   CALCIUM  CARBONATE-VITAMIN D  PO Take 1 tablet by mouth daily. 600-400 mg     diclofenac  Sodium (VOLTAREN ) 1 % GEL Apply 2 g topically 4 (four) times daily as needed. 100 g 0   furosemide  (LASIX ) 80 MG tablet Take 1 tablet (80 mg total) by mouth daily. 90 tablet 1   Iron , Ferrous Sulfate , 325 (65 Fe) MG TABS Take 325 mg by mouth every other day. 30 tablet    loratadine (CLARITIN) 10 MG tablet Take 10 mg by mouth daily. [employer]     meloxicam  (MOBIC ) 7.5 MG tablet Take 1 tablet (7.5 mg total) by mouth daily. 90 tablet 3   Multiple Vitamins-Minerals (MULTIVITAMIN WITH MINERALS) tablet Take 1 tablet by mouth daily.     omeprazole  (PRILOSEC) 20 MG capsule Take 1 capsule (20 mg total) by mouth daily. 90 capsule 1   rosuvastatin  (CRESTOR ) 20 MG tablet Take 1 tablet (20 mg total) by mouth at bedtime. 90 tablet 1   metoprolol  succinate (TOPROL -XL) 50 MG 24 hr tablet Take 1 tablet (50 mg total) by mouth daily. Take with or immediately following a meal. (Patient not taking: Reported on 09/09/2023) 30 tablet 0   No facility-administered medications prior to visit.    Allergies  Allergen Reactions   Oxycodone -Acetaminophen  Nausea Only   Penicillins     Childhood Pt received Ancef  on 06-28-20 and tolerated it well   Pregabalin     Dizziness   Tramadol Nausea And Vomiting    ROS See HPI    Objective:    Physical Exam Constitutional:      Appearance: Normal appearance. She is well-developed.  Cardiovascular:     Rate and Rhythm: Normal rate and  regular rhythm.     Heart sounds: Normal heart sounds. No murmur heard. Pulmonary:     Effort: Pulmonary effort is normal. No respiratory distress.     Breath sounds: Normal breath sounds. No wheezing.  Neurological:     Mental Status: She is alert.  Psychiatric:  Behavior: Behavior normal.        Thought Content: Thought content normal.        Judgment: Judgment normal.      BP (!) 160/65   Pulse (!) 53   Temp 98.2 F (36.8 C) (Oral)   Resp 18   Ht 5' 3 (1.6 m)   Wt 165 lb 9.6 oz (75.1 kg)   SpO2 99%   BMI 29.33 kg/m  Wt Readings from Last 3 Encounters:  09/09/23 165 lb 9.6 oz (75.1 kg)  09/01/23 159 lb (72.1 kg)  07/15/23 161 lb (73 kg)       Assessment & Plan:   Problem List Items Addressed This Visit       Unprioritized   Hypertension    Blood pressure elevated to 160/65. Metoprolol  discontinued due to side effects. Lisinopril  to be reintroduced with monitoring for hyperkalemia. - withhold lisinopril  if SBP <110. - Schedule follow-up in three weeks for BP and lab recheck. - Instruct to monitor BP at home, report if consistently >160.      Relevant Medications   lisinopril  (ZESTRIL ) 10 MG tablet   Foot pain, bilateral - Primary   New.  Refer to podiatry.      Relevant Orders   Ambulatory referral to Podiatry    I am having Arnette  EMERSON Molly start on lisinopril . I am also having her maintain her loratadine, aspirin  EC, multivitamin with minerals, diclofenac  Sodium, CALCIUM  CARBONATE-VITAMIN D  PO, alendronate , Iron  (Ferrous Sulfate ), furosemide , meloxicam , rosuvastatin , omeprazole , and metoprolol  succinate.  Meds ordered this encounter  Medications   lisinopril  (ZESTRIL ) 10 MG tablet    Sig: Hold if blood pressure is <110.    Dispense:  90 tablet    Refill:  1    Supervising Provider:   DOMENICA BLACKBIRD A [[id number]]

## 2023-09-09 NOTE — Patient Instructions (Addendum)
 Please call cardiology to schedule your appointment- [phone]  Start lisinopril  10mg  once daily. Do not take if blood pressure is <110.

## 2023-09-10 MED ORDER — LISINOPRIL 10 MG PO TABS: 10.0000 mg | ORAL_TABLET | Freq: Every day | ORAL | 1 refills | Status: DC

## 2023-09-10 NOTE — Telephone Encounter (Signed)
 Copied from CRM [id number]. Topic: Clinical - Prescription Issue >> Sep 10, 2023  9:50 AM Ernestene P wrote: Reason for CRM: Pt advise [facility] stated the prescription for lisinopril  10 mg directions is not clear, need a new prescription with clear directions.

## 2023-09-15 ENCOUNTER — Ambulatory Visit

## 2023-09-15 ENCOUNTER — Emergency Department (HOSPITAL_BASED_OUTPATIENT_CLINIC_OR_DEPARTMENT_OTHER)

## 2023-09-15 ENCOUNTER — Inpatient Hospital Stay (HOSPITAL_BASED_OUTPATIENT_CLINIC_OR_DEPARTMENT_OTHER)
Admission: EM | Admit: 2023-09-15 | Discharge: 2023-09-17 | DRG: 242 | Disposition: A | Discharge status: Home / Self Care | Source: Skilled Nursing Facility | Attending: Internal Medicine | Admitting: Internal Medicine

## 2023-09-15 ENCOUNTER — Other Ambulatory Visit: Payer: Self-pay

## 2023-09-15 VITALS — BP 156/54 | HR 54 | Ht 63.0 in | Wt 162.0 lb

## 2023-09-15 DIAGNOSIS — E782 Mixed hyperlipidemia: Secondary | ICD-10-CM

## 2023-09-15 DIAGNOSIS — I110 Hypertensive heart disease with heart failure: Secondary | ICD-10-CM | POA: Diagnosis present

## 2023-09-15 DIAGNOSIS — Z791 Long term (current) use of non-steroidal anti-inflammatories (NSAID): Secondary | ICD-10-CM | POA: Diagnosis not present

## 2023-09-15 DIAGNOSIS — I509 Heart failure, unspecified: Secondary | ICD-10-CM

## 2023-09-15 DIAGNOSIS — R918 Other nonspecific abnormal finding of lung field: Secondary | ICD-10-CM | POA: Diagnosis not present

## 2023-09-15 DIAGNOSIS — I442 Atrioventricular block, complete: Secondary | ICD-10-CM

## 2023-09-15 DIAGNOSIS — Z83438 Family history of other disorder of lipoprotein metabolism and other lipidemia: Secondary | ICD-10-CM

## 2023-09-15 DIAGNOSIS — R42 Dizziness and giddiness: Secondary | ICD-10-CM | POA: Diagnosis not present

## 2023-09-15 DIAGNOSIS — Z8249 Family history of ischemic heart disease and other diseases of the circulatory system: Secondary | ICD-10-CM

## 2023-09-15 DIAGNOSIS — Z800 Family history of malignant neoplasm of digestive organs: Secondary | ICD-10-CM | POA: Diagnosis not present

## 2023-09-15 DIAGNOSIS — Z91048 Other nonmedicinal substance allergy status: Secondary | ICD-10-CM

## 2023-09-15 DIAGNOSIS — Z96612 Presence of left artificial shoulder joint: Secondary | ICD-10-CM | POA: Diagnosis present

## 2023-09-15 DIAGNOSIS — I5032 Chronic diastolic (congestive) heart failure: Secondary | ICD-10-CM | POA: Diagnosis not present

## 2023-09-15 DIAGNOSIS — I4430 Unspecified atrioventricular block: Secondary | ICD-10-CM | POA: Diagnosis not present

## 2023-09-15 DIAGNOSIS — E785 Hyperlipidemia, unspecified: Secondary | ICD-10-CM | POA: Diagnosis present

## 2023-09-15 DIAGNOSIS — M8580 Other specified disorders of bone density and structure, unspecified site: Secondary | ICD-10-CM | POA: Diagnosis present

## 2023-09-15 DIAGNOSIS — R0602 Shortness of breath: Secondary | ICD-10-CM | POA: Insufficient documentation

## 2023-09-15 DIAGNOSIS — Z7982 Long term (current) use of aspirin: Secondary | ICD-10-CM

## 2023-09-15 DIAGNOSIS — I459 Conduction disorder, unspecified: Secondary | ICD-10-CM | POA: Diagnosis not present

## 2023-09-15 DIAGNOSIS — I5033 Acute on chronic diastolic (congestive) heart failure: Secondary | ICD-10-CM | POA: Diagnosis not present

## 2023-09-15 DIAGNOSIS — Z8679 Personal history of other diseases of the circulatory system: Secondary | ICD-10-CM | POA: Diagnosis present

## 2023-09-15 DIAGNOSIS — Z885 Allergy status to narcotic agent status: Secondary | ICD-10-CM

## 2023-09-15 DIAGNOSIS — Z79899 Other long term (current) drug therapy: Secondary | ICD-10-CM | POA: Diagnosis not present

## 2023-09-15 DIAGNOSIS — Z7983 Long term (current) use of bisphosphonates: Secondary | ICD-10-CM | POA: Diagnosis not present

## 2023-09-15 DIAGNOSIS — Z880 Allergy status to penicillin: Secondary | ICD-10-CM

## 2023-09-15 DIAGNOSIS — I2510 Atherosclerotic heart disease of native coronary artery without angina pectoris: Secondary | ICD-10-CM | POA: Insufficient documentation

## 2023-09-15 DIAGNOSIS — Z808 Family history of malignant neoplasm of other organs or systems: Secondary | ICD-10-CM | POA: Diagnosis not present

## 2023-09-15 DIAGNOSIS — I10 Essential (primary) hypertension: Secondary | ICD-10-CM

## 2023-09-15 HISTORY — DX: Atherosclerotic heart disease of native coronary artery without angina pectoris: I25.10

## 2023-09-15 HISTORY — DX: Atrioventricular block, complete: I44.2

## 2023-09-15 HISTORY — DX: Shortness of breath: R06.02

## 2023-09-15 HISTORY — DX: Personal history of other diseases of the circulatory system: Z86.79

## 2023-09-15 LAB — BASIC METABOLIC PANEL WITH GFR
Anion gap: 13 (ref 5–15)
BUN: 27 mg/dL — ABNORMAL HIGH (ref 8–23)
CO2: 26 mmol/L (ref 22–32)
Calcium: 9.7 mg/dL (ref 8.9–10.3)
Chloride: 102 mmol/L (ref 98–111)
Creatinine, Ser: 1.1 mg/dL — ABNORMAL HIGH (ref 0.44–1.00)
GFR, Estimated: 51 mL/min — ABNORMAL LOW (ref >60)
Glucose, Bld: 122 mg/dL — ABNORMAL HIGH (ref 70–99)
Potassium: 3.5 mmol/L (ref 3.5–5.1)
Sodium: 142 mmol/L (ref 135–145)

## 2023-09-15 LAB — CBC
HCT: 37.2 % (ref 36.0–46.0)
Hemoglobin: 12.2 g/dL (ref 12.0–15.0)
MCH: 30.4 pg (ref 26.0–34.0)
MCHC: 32.8 g/dL (ref 30.0–36.0)
MCV: 92.8 fL (ref 80.0–100.0)
Platelets: 249 K/uL (ref 150–400)
RBC: 4.01 MIL/uL (ref 3.87–5.11)
RDW: 14.9 % (ref 11.5–15.5)
WBC: 10.5 K/uL (ref 4.0–10.5)
nRBC: 0 % (ref 0.0–0.2)

## 2023-09-15 LAB — PRO BRAIN NATRIURETIC PEPTIDE: Pro Brain Natriuretic Peptide: 1568 pg/mL — ABNORMAL HIGH (ref <300.0)

## 2023-09-15 LAB — TROPONIN T, HIGH SENSITIVITY
Troponin T High Sensitivity: 28 ng/L — ABNORMAL HIGH (ref <19)
Troponin T High Sensitivity: 27 ng/L — ABNORMAL HIGH (ref <19)

## 2023-09-15 MED ORDER — FUROSEMIDE 10 MG/ML IJ SOLN
40.0000 mg | Freq: Once | INTRAMUSCULAR | Status: AC
  Administered 2023-09-15: 40 mg via INTRAVENOUS
  Filled 2023-09-15: qty 4

## 2023-09-15 NOTE — Progress Notes (Signed)
 Cardiology Consultation:    Date:  09/15/2023   ID:  Michele  Williamson, Michele Williamson Apr 03, 1940, MRN [id number]  PCP:  Michele Setter, NP  Cardiologist:  Michele JONELLE Kobus, MD   Referring MD: Michele Setter, NP   No chief complaint on file.    ASSESSMENT AND PLAN:   Michele Williamson 83 year old woman history of moderate nonobstructive CAD on cardiac CT 04-12-2020 [calcium  score 87, CAD RADS 3 study, moderate proximal LAD stenosis with mixed plaque, not significant by CT FFR], hypertension, hyperlipidemia, left bundle branch block, chronic bilateral lower extremity edema, with normal biventricular function and diastolic filling pressures on echocardiogram January 2022. Has been maintained on Lasix  80 mg once daily chronically.  Recently for the past month she has been having symptoms of shortness of breath with exertion associated with chest pressure, near syncope and lightheadedness occasionally and elevated blood pressures, did not tolerate beta-blockers due to slow heart rates.  Problem List Items Addressed This Visit     Hypertension   Currently on lisinopril  10 mg once daily. Continue same. Avoid AV nodal blocking agents.      Hyperlipidemia   Continue rosuvastatin  20 mg once daily. Last lipid panel 06/12/2023 with HDL 49, LDL 82, triglycerides 199 and total cholesterol 172.      Chronic diastolic CHF (congestive heart failure) (HCC)   Appears euvolemic and compensated. Weight today 162 pounds.  She is on Lasix  80 mg 1 time a day chronically and has been tolerating it well. Recent blood work creatinine 1.07 on 09/01/2023.  Will obtain thoracic echocardiogram to assess cardiac structure and function and diastolic function.       Shortness of breath on exertion   Symptoms are likely related to her conduction abnormalities. Low suspicion to evaluate for coronary artery disease progression given her description of symptoms and existing moderate nonobstructive coronary artery  disease on prior cardiac CT.  Being sent to the ER today in the setting of sinus tachycardia versus ectopic atrial tachycardia with high-grade AV block possibly complete heart block noted on the EKG.      CAD (coronary artery disease)   Moderate nonobstructive disease on cardiac CT 2/10/2022calcium score 87, CAD RADS 3 study, moderate proximal LAD stenosis with mixed plaque, not significant by CT FFR.  Now with progressive symptoms of dyspnea on exertion associated with chest pressure for the past 1 to 2 months. Blood pressures are elevated, improving with recent lisinopril  initiated by PCP.  Did not tolerate beta-blockers due to slow heart rates.  Continue aspirin  81 mg once daily Continue rosuvastatin  20 mg once daily.  Hold off on beta-blockers and other AV nodal blocking agents.  Symptoms likely related to complete heart block as reviewed on the EKG. Sending her to the ER for further evaluation.  Symptoms likely related to complete heart block, but low threshold to assess for any obstructive coronary artery disease given her underlying history.      Relevant Orders   EKG 12-Lead (Completed)   Complete heart block (HCC) - Primary   EKG sinus tachycardia at atrial rate 121/min and ventricular rate 56/min with A-V dissociation consistent with complete heart block.  Cannot entirely exclude ectopic atrial tachycardia with high-grade AV block.   Likely the cause of her underlying symptoms. Discussed the findings with her. Possibility of need for monitoring and high likelihood of need for pacemaker reviewed. Will be sent to the ER at times if she is agreeable with this.  Potentially will be transferred to [facility]  for further evaluation.       Tentatively scheduled for follow-up visit in 3 to 4 weeks post cath.  History of being more IV 111 taking anything   History of Present Illness:    Michele  Michele Williamson Williamson is a 83 y.o. female who is being seen today for visit. PCP  is Michele Setter, NP. Last visit with us  was 09/24/2021 with Dr. Monetta.  Has history of moderate nonobstructive CAD on cardiac CT 04-12-2020 [calcium  score 87, CAD RADS 3 study, moderate proximal LAD stenosis with mixed plaque, not significant by CT FFR], hypertension, hyperlipidemia, left bundle branch block, chronic bilateral lower extremity edema, with normal biventricular function and diastolic filling pressures on echocardiogram January 2022.  Starting around May she has been noting elevated blood pressures at home.  Also symptoms of shortness of breath which have been progressing with exertion.  She had couple episodes where she suddenly felt weak and went down to the floor without loss of consciousness and had to be helped up and took few minutes for her to start feeling normal.  Subsequently noted blood pressure fluctuations and heart rates down into the 50s on metoprolol .  In this context on follow-up visit July 9 with PCP metoprolol  was discontinued and she was started on lisinopril .  Mentions she still continues to have elevated blood pressures with systolic reading 849d to 160s at home using her device. She continues to feel shortness of breath walking short distance from parking lot to the front of the building.  Mentions this is a change in comparison to the about 2 months ago.  Denies any significant symptoms at rest. Ambulates using a cane. Denies any syncopal episodes but felt lightheaded and had to come down to the floor on couple occasions in the last 2 months. Denies any significant palpitations, skipped beats or extra beats.  Denies any significant pitting pedal edema and continues to take furosemide  80 mg once a day for a long time.  Good compliance with medications and was recently started on lisinopril  which she has been adherent with mentions blood pressures have improved but still remain elevated.   Recent EKG at PCPs office from 09/01/2023 noted sinus tachycardia with  baseline LBBB morphology QRS duration 128 ms and heart rate 120/min.  EKG sinus tachycardia at atrial rate 121/min and ventricular rate 56/min with A-V dissociation consistent with complete heart block.  Cannot entirely exclude ectopic atrial tachycardia with high-grade AV block.  Past Medical History:  Diagnosis Date   Arthritis    shoulder, hands , feet, knees,   Complication of anesthesia    Heart murmur    Hyperglycemia    Hyperlipemia    Hypertension    Osteopenia    PONV (postoperative nausea and vomiting)     Past Surgical History:  Procedure Laterality Date   BUNIONECTOMY Right    CARPAL TUNNEL RELEASE     CATARACT EXTRACTION     CESAREAN SECTION     CHOLECYSTECTOMY  2001   REVERSE SHOULDER ARTHROPLASTY Left 06/28/2020   Procedure: REVERSE SHOULDER ARTHROPLASTY;  Surgeon: Melita Drivers, MD;  Location: [facility];  Service: Orthopedics;  Laterality: Left;    ROTATOR CUFF REPAIR Right     Current Medications: Current Meds  Medication Sig   alendronate  (FOSAMAX ) 70 MG tablet Take 1 tablet (70 mg total) by mouth every 7 (seven) days. Take with a full glass of water on an empty stomach.   aspirin  EC 81 MG tablet Take 1 tablet (81 mg  total) by mouth daily. Swallow whole.   CALCIUM  CARBONATE-VITAMIN D  PO Take 1 tablet by mouth daily. 600-400 mg   diclofenac  Sodium (VOLTAREN ) 1 % GEL Apply 2 g topically 4 (four) times daily as needed.   furosemide  (LASIX ) 80 MG tablet Take 1 tablet (80 mg total) by mouth daily.   Iron , Ferrous Sulfate , 325 (65 Fe) MG TABS Take 325 mg by mouth every other day.   lisinopril  (ZESTRIL ) 10 MG tablet Take 1 tablet (10 mg total) by mouth daily. Hold if blood pressure is <110.   loratadine (CLARITIN) 10 MG tablet Take 10 mg by mouth daily. [employer]   meloxicam  (MOBIC ) 7.5 MG tablet Take 1 tablet (7.5 mg total) by mouth daily.   Multiple Vitamins-Minerals (MULTIVITAMIN WITH MINERALS) tablet Take 1 tablet by mouth daily.   nortriptyline  (PAMELOR) 25 MG capsule Take 25 mg by mouth at bedtime.   omeprazole  (PRILOSEC) 20 MG capsule Take 1 capsule (20 mg total) by mouth daily.   rosuvastatin  (CRESTOR ) 20 MG tablet Take 1 tablet (20 mg total) by mouth at bedtime.     Allergies:   Oxycodone -acetaminophen , Penicillins, Pregabalin, and Tramadol   Social History   Socioeconomic History   Marital status: Widowed    Spouse name: Not on file   Number of children: Not on file   Years of education: Not on file   Highest education level: 12th grade  Occupational History   Not on file  Tobacco Use   Smoking status: Never   Smokeless tobacco: Never  Vaping Use   Vaping status: Never Used  Substance and Sexual Activity   Alcohol use: No   Drug use: No   Sexual activity: Yes    Birth control/protection: None  Other Topics Concern   Not on file  Social History Narrative   Lives in [place]   Has an adopted son born 10-12-08   Husband died in 2006/10/13   Retired, worked as an Insurance underwriter   Enjo[facility] senior center   Has a Veterinary surgeon   Completed HS   Social Drivers of Corporate investment banker Strain: Low Risk  (08/04/2022)   Overall Financial Resource Strain (CARDIA)    Difficulty of Paying Living Expenses: Not hard at all  Food Insecurity: No Food Insecurity (08/04/2022)   Hunger Vital Sign    Worried About Running Out of Food in the Last Year: Never true    Ran Out of Food in the Last Year: Never true  Transportation Needs: No Transportation Needs (08/04/2022)   PRAPARE - Administrator, Civil Service (Medical): No    Lack of Transportation (Non-Medical): No  Physical Activity: Unknown (08/04/2022)   Exercise Vital Sign    Days of Exercise per Week: 0 days    Minutes of Exercise per Session: Not on file  Stress: Stress Concern Present (08/04/2022)   [employer] of Occupational Health - Occupational Stress Questionnaire    Feeling of Stress : Rather much  Social Connections: Unknown (11/14/2022)   Received  from [facility]   Social Network    Social Network: Not on file     Family History: The patient's family history includes Bone cancer in her father; Heart disease in her mother; Hyperlipidemia in her father; Hypertension in her father and mother; Melanoma in her brother; Obesity in her son; Stomach cancer in her maternal grandmother. ROS:   Please see the history of present illness.    All 14 point review of systems  negative except as described per history of present illness.  EKGs/Labs/Other Studies Reviewed:    The following studies were reviewed today:   EKG:  EKG Interpretation Date/Time:  Tuesday September 15 2023 14:10:10 EDT Ventricular Rate:  56 PR Interval:    QRS Duration:  130 QT Interval:  444 QTC Calculation: 428 R Axis:   87  Text Interpretation: Sinus tachycardia with complete heart block Left bundle branch block No previous ECGs available Confirmed by Liborio Hai reddy [phone]) on 09/15/2023 2:19:42 PM    Recent Labs: 09/01/2023: ALT 17; BUN 26; Creatinine, Ser 1.07; Hemoglobin 15.0; Platelets 293.0; Potassium 3.9; Sodium 140  Recent Lipid Panel    Component Value Date/Time   CHOL 172 06/12/2023 1056   TRIG 199.0 (H) 06/12/2023 1056   HDL 49.90 06/12/2023 1056   CHOLHDL 3 06/12/2023 1056   VLDL 39.8 06/12/2023 1056   LDLCALC 82 06/12/2023 1056   LDLDIRECT 93.0 08/05/2022 1042    Physical Exam:    VS:  BP (!) 156/54   Pulse (!) 54   Ht 5' 3 (1.6 m)   Wt 162 lb (73.5 kg)   SpO2 96%   BMI 28.70 kg/m     Wt Readings from Last 3 Encounters:  09/15/23 162 lb (73.5 kg)  09/09/23 165 lb 9.6 oz (75.1 kg)  09/01/23 159 lb (72.1 kg)     GENERAL:  Well nourished, well developed in no acute distress NECK: No JVD; No carotid bruits CARDIAC: RRR, S1 and S2 present, no murmurs, no rubs, no gallops CHEST:  Clear to auscultation without rales, wheezing or rhonchi  Extremities: No pitting pedal edema. Pulses bilaterally symmetric with radial 2+ and  dorsalis pedis 2+ NEUROLOGIC:  Alert and oriented x 3  Medication Adjustments/Labs and Tests Ordered: Current medicines are reviewed at length with the patient today.  Concerns regarding medicines are outlined above.  Orders Placed This Encounter  Procedures   EKG 12-Lead   No orders of the defined types were placed in this encounter.   Signed, Hai jess Liborio, MD, MPH, [facility]. 09/15/2023 2:25 PM    [facility]

## 2023-09-15 NOTE — Assessment & Plan Note (Signed)
 Currently on lisinopril  10 mg once daily. Continue same. Avoid AV nodal blocking agents.

## 2023-09-15 NOTE — Progress Notes (Signed)
 Patient was seen this afternoon at cardiology office, with complaints of exertional dyspnea,  workup at cardiology office was significant for heart block, she was instructed to come to ED by cardiology office for admission and further workup, as well ED workup significant for elevated BNP, and interstitial opacities, concerning for acute on chronic diastolic CHF, vital signs are stable, patient accepted to progressive unit. Brayton Lye MD

## 2023-09-15 NOTE — Assessment & Plan Note (Signed)
 Symptoms are likely related to her conduction abnormalities. Low suspicion to evaluate for coronary artery disease progression given her description of symptoms and existing moderate nonobstructive coronary artery disease on prior cardiac CT.  Being sent to the ER today in the setting of sinus tachycardia versus ectopic atrial tachycardia with high-grade AV block possibly complete heart block noted on the EKG.

## 2023-09-15 NOTE — ED Provider Notes (Signed)
 [facility] Provider Note   CSN: [id number] Arrival date & time: 09/15/23  1424     Patient presents with: Dizziness and Chest Pain   Michele Williamson is a 83 y.o. female with past medical history significant for hyperlipidemia, hypertension, CHF who presents from cardiologist with concern for new onset heart block.  2-1 AV conduction.  She endorses some lightheadedness, she reports that she has had a few falls recently with unclear reason although she does not feel necessarily that she syncopized.  She denies any chest pain but does endorse some shortness of breath with exertion that has worsened.    Dizziness Associated symptoms: chest pain   Chest Pain Associated symptoms: dizziness        Prior to Admission medications   Medication Sig Start Date End Date Taking? Authorizing Provider  alendronate  (FOSAMAX ) 70 MG tablet Take 1 tablet (70 mg total) by mouth every 7 (seven) days. Take with a full glass of water on an empty stomach. 04/30/22   Daryl Setter, NP  aspirin  EC 81 MG tablet Take 1 tablet (81 mg total) by mouth daily. Swallow whole. 03/09/20   Monetta Redell PARAS, MD  CALCIUM  CARBONATE-VITAMIN D  PO Take 1 tablet by mouth daily. 600-400 mg    [provider]  diclofenac  Sodium (VOLTAREN ) 1 % GEL Apply 2 g topically 4 (four) times daily as needed. 08/25/21   Long, Fonda MATSU, MD  furosemide  (LASIX ) 80 MG tablet Take 1 tablet (80 mg total) by mouth daily. 04/10/23   Daryl Setter, NP  Iron , Ferrous Sulfate , 325 (65 Fe) MG TABS Take 325 mg by mouth every other day. 05/01/22   O'Sullivan, Melissa, NP  lisinopril  (ZESTRIL ) 10 MG tablet Take 1 tablet (10 mg total) by mouth daily. Hold if blood pressure is <110. 09/10/23   O'Sullivan, Melissa, NP  loratadine (CLARITIN) 10 MG tablet Take 10 mg by mouth daily. [employer]    [provider]  meloxicam  (MOBIC ) 7.5 MG tablet Take 1 tablet (7.5 mg total) by mouth daily.  04/10/23   O'Sullivan, Melissa, NP  Multiple Vitamins-Minerals (MULTIVITAMIN WITH MINERALS) tablet Take 1 tablet by mouth daily.    [provider]  nortriptyline (PAMELOR) 25 MG capsule Take 25 mg by mouth at bedtime.    [provider]  omeprazole  (PRILOSEC) 20 MG capsule Take 1 capsule (20 mg total) by mouth daily. 04/10/23   O'Sullivan, Melissa, NP  rosuvastatin  (CRESTOR ) 20 MG tablet Take 1 tablet (20 mg total) by mouth at bedtime. 04/10/23   O'Sullivan, Melissa, NP    Allergies: Cat dander, Oxycodone -acetaminophen , Penicillins, Pregabalin, and Tramadol    Review of Systems  Cardiovascular:  Positive for chest pain.  Neurological:  Positive for dizziness.  All other systems reviewed and are negative.   Updated Vital Signs BP (!) 151/44   Pulse (!) 53   Temp 98.4 F (36.9 C)   Resp 16   Ht 5' 3 (1.6 m)   Wt 73.5 kg   SpO2 97%   BMI 28.70 kg/m   Physical Exam Vitals and nursing note reviewed.  Constitutional:      General: She is not in acute distress.    Appearance: Normal appearance.  HENT:     Head: Normocephalic and atraumatic.  Eyes:     General:        Right eye: No discharge.        Left eye: No discharge.  Cardiovascular:  Rate and Rhythm: Normal rate and regular rhythm.     Heart sounds: No murmur heard.    No friction rub. No gallop.  Pulmonary:     Effort: Pulmonary effort is normal.     Breath sounds: Normal breath sounds.     Comments: Mild crackles noted at lung bases, no acute respiratory distress Abdominal:     General: Bowel sounds are normal.     Palpations: Abdomen is soft.  Musculoskeletal:     Comments: Mild lower extremity edema  Skin:    General: Skin is warm and dry.     Capillary Refill: Capillary refill takes less than 2 seconds.  Neurological:     Mental Status: She is alert and oriented to person, place, and time.  Psychiatric:        Mood and Affect: Mood normal.        Behavior: Behavior normal.     (all  labs ordered are listed, but only abnormal results are displayed) Labs Reviewed  BASIC METABOLIC PANEL WITH GFR - Abnormal; Notable for the following components:      Result Value   Glucose, Bld 122 (*)    BUN 27 (*)    Creatinine, Ser 1.10 (*)    GFR, Estimated 51 (*)    All other components within normal limits  PRO BRAIN NATRIURETIC PEPTIDE - Abnormal; Notable for the following components:   Pro Brain Natriuretic Peptide 1,568.0 (*)    All other components within normal limits  TROPONIN T, HIGH SENSITIVITY - Abnormal; Notable for the following components:   Troponin T High Sensitivity 28 (*)    All other components within normal limits  CBC  TROPONIN T, HIGH SENSITIVITY    EKG: None  Radiology: [facility] Chest Port 1 View Result Date: 09/15/2023 CLINICAL DATA:  Shortness of breath and lightheadedness EXAM: PORTABLE CHEST 1 VIEW COMPARISON:  Chest radiograph dated 12/24/2016 FINDINGS: Normal lung volumes. Bilateral lower lung interstitial opacities. No pleural effusion or pneumothorax. The heart size and mediastinal contours are within normal limits. Left shoulder arthroplasty. IMPRESSION: Bilateral lower lung interstitial opacities, which may represent atelectasis/scarring, edema, or atypical infection. Electronically Signed   By: Limin  Xu M.D.   On: 09/15/2023 15:41     Procedures   Medications Ordered in the ED  furosemide  (LASIX ) injection 40 mg (40 mg Intravenous Given 09/15/23 1554)                                    Medical Decision Making  This patient is a 83 y.o. female  who presents to the ED for concern of chest pain, shortness of breath with exertion, from cardiologist upstairs with concern for complete heart block.   Differential diagnoses prior to evaluation: The emergent differential diagnosis includes, but is not limited to,  ACS, AAS, PE, Mallory-Weiss, Boerhaave's, Pneumonia, acute bronchitis, asthma or COPD exacerbation, anxiety, MSK pain or traumatic injury  to the chest, acid reflux versus other --given her evaluation by cardiology just prior to arrival high clinical suspicion for some degree of heart block, complete heart block versus other A-V dissociation. This is not an exhaustive differential.   Past Medical History / Co-morbidities / Social History:  hyperlipidemia, hypertension, CHF  Additional history: Chart reviewed. Pertinent results include: Reviewed cardiology note just prior to arrival with concern for heart block requiring emergent evaluation  Physical Exam: Physical exam performed. The pertinent findings include: Mild  bradycardia, pulse of 53, some hypertension, blood pressure 151/44 with somewhat wide pulse pressure.  Lab Tests/Imaging studies: I personally interpreted labs/imaging and the pertinent results include: BMP with mildly elevated glucose 122, BUN 27, creatinine 1.1.  Her troponin is mildly elevated at 28, BNP 1568. I independently interpreted plain film chest xray which shows interstitial  I agree with the radiologist interpretation.  Cardiac monitoring: EKG obtained and interpreted by myself and attending physician which shows: 2:1 AV dissociation, as described by cardiology prior to arrival   Medications: I ordered medication including lasix  for heart failure exacerbation.  I have reviewed the patients home medicines and have made adjustments as needed.  Consults: Spoke with hospitalist, Dr. Sherlon who agrees to admission for 2:1 heart block, heart failure exacerbation, plain to see cardiology on arrival.   Disposition: After consideration of the diagnostic results and the patients response to treatment, I feel that patient would benefit from admission as discussed above .    Final diagnoses:  None    ED Discharge Orders     None          Michele Sherlean DEL, PA-C 09/15/23 1613    Michele Ozell LABOR, DO 09/19/23 2331

## 2023-09-15 NOTE — Assessment & Plan Note (Signed)
 Appears euvolemic and compensated. Weight today 162 pounds.  She is on Lasix  80 mg 1 time a day chronically and has been tolerating it well. Recent blood work creatinine 1.07 on 09/01/2023.  Will obtain thoracic echocardiogram to assess cardiac structure and function and diastolic function.

## 2023-09-15 NOTE — Assessment & Plan Note (Addendum)
 Continue rosuvastatin  20 mg once daily. Last lipid panel 06/12/2023 with HDL 49, LDL 82, triglycerides 199 and total cholesterol 172.

## 2023-09-15 NOTE — Assessment & Plan Note (Signed)
 EKG sinus tachycardia at atrial rate 121/min and ventricular rate 56/min with A-V dissociation consistent with complete heart block.  Cannot entirely exclude ectopic atrial tachycardia with high-grade AV block.   Likely the cause of her underlying symptoms. Discussed the findings with her. Possibility of need for monitoring and high likelihood of need for pacemaker reviewed. Will be sent to the ER at times if she is agreeable with this.  Potentially will be transferred to [facility] for further evaluation.

## 2023-09-15 NOTE — Patient Instructions (Addendum)
 Medication Instructions:  Your physician recommends that you continue on your current medications as directed. Please refer to the Current Medication list given to you today.  *If you need a refill on your cardiac medications before your next appointment, please call your pharmacy*  Lab Work:  If you have labs (blood work) drawn today and your tests are completely normal, you will receive your results only by: MyChart Message (if you have MyChart) OR A paper copy in the mail If you have any lab test that is abnormal or we need to change your treatment, we will call you to review the results.  Testing/Procedures: None ordered.     Follow-Up: At [facility], you and your health needs are our priority.  As part of our continuing mission to provide you with exceptional heart care, our providers are all part of one team.  This team includes your primary Cardiologist (physician) and Advanced Practice Providers or APPs (Physician Assistants and Nurse Practitioners) who all work together to provide you with the care you need, when you need it.  Your next appointment:   4 weeks with Dr Madireddy  We recommend signing up for the patient portal called MyChart.  Sign up information is provided on this After Visit Summary.  MyChart is used to connect with patients for Virtual Visits (Telemedicine).  Patients are able to view lab/test results, encounter notes, upcoming appointments, etc.  Non-urgent messages can be sent to your provider as well.   To learn more about what you can do with MyChart, go to [email or web address].   Other Instructions:  To the ED per Dr Madireddy

## 2023-09-15 NOTE — Assessment & Plan Note (Addendum)
 Moderate nonobstructive disease on cardiac CT 2/10/2022calcium score 87, CAD RADS 3 study, moderate proximal LAD stenosis with mixed plaque, not significant by CT FFR.  Now with progressive symptoms of dyspnea on exertion associated with chest pressure for the past 1 to 2 months. Blood pressures are elevated, improving with recent lisinopril  initiated by PCP.  Did not tolerate beta-blockers due to slow heart rates.  Continue aspirin  81 mg once daily Continue rosuvastatin  20 mg once daily.  Hold off on beta-blockers and other AV nodal blocking agents.  Symptoms likely related to complete heart block as reviewed on the EKG. Sending her to the ER for further evaluation.  Symptoms likely related to complete heart block, but low threshold to assess for any obstructive coronary artery disease given her underlying history.

## 2023-09-15 NOTE — ED Triage Notes (Signed)
 Pt here from [facility] with complete heart block. Pt has a possible 2:1 AV conduction. Pt also is having some lightheadedness. Pt denies chest pain but endorses SOB when walking.

## 2023-09-15 NOTE — ED Notes (Signed)
 Pt requested an update about transfer status, EDP arrived to bedside for same.

## 2023-09-15 NOTE — ED Notes (Signed)
 Pt expresses frustration about the ling delay in her care after being told about her admission.  Pt was informed there was a bed request made, and that the charge nurse would be notified about the bedstatus

## 2023-09-16 ENCOUNTER — Inpatient Hospital Stay (HOSPITAL_COMMUNITY)

## 2023-09-16 ENCOUNTER — Ambulatory Visit (HOSPITAL_COMMUNITY): Admission: RE | Admit: 2023-09-16 | Source: Home / Self Care | Admitting: Cardiology

## 2023-09-16 ENCOUNTER — Encounter (HOSPITAL_COMMUNITY)
Admission: EM | Disposition: A | Discharge status: Home / Self Care | Payer: Self-pay | Source: Ambulatory Visit | Attending: Internal Medicine

## 2023-09-16 ENCOUNTER — Other Ambulatory Visit: Payer: Self-pay

## 2023-09-16 ENCOUNTER — Encounter (HOSPITAL_COMMUNITY): Admission: RE | Payer: Self-pay | Source: Home / Self Care

## 2023-09-16 ENCOUNTER — Encounter (HOSPITAL_COMMUNITY): Payer: Self-pay | Admitting: Cardiovascular Disease

## 2023-09-16 DIAGNOSIS — Z791 Long term (current) use of non-steroidal anti-inflammatories (NSAID): Secondary | ICD-10-CM | POA: Diagnosis not present

## 2023-09-16 DIAGNOSIS — I442 Atrioventricular block, complete: Secondary | ICD-10-CM

## 2023-09-16 DIAGNOSIS — E785 Hyperlipidemia, unspecified: Secondary | ICD-10-CM | POA: Diagnosis present

## 2023-09-16 DIAGNOSIS — Z800 Family history of malignant neoplasm of digestive organs: Secondary | ICD-10-CM | POA: Diagnosis not present

## 2023-09-16 DIAGNOSIS — Z83438 Family history of other disorder of lipoprotein metabolism and other lipidemia: Secondary | ICD-10-CM | POA: Diagnosis not present

## 2023-09-16 DIAGNOSIS — Z91048 Other nonmedicinal substance allergy status: Secondary | ICD-10-CM | POA: Diagnosis not present

## 2023-09-16 DIAGNOSIS — Z7982 Long term (current) use of aspirin: Secondary | ICD-10-CM | POA: Diagnosis not present

## 2023-09-16 DIAGNOSIS — Z7983 Long term (current) use of bisphosphonates: Secondary | ICD-10-CM | POA: Diagnosis not present

## 2023-09-16 DIAGNOSIS — I5033 Acute on chronic diastolic (congestive) heart failure: Secondary | ICD-10-CM | POA: Diagnosis present

## 2023-09-16 DIAGNOSIS — I509 Heart failure, unspecified: Secondary | ICD-10-CM | POA: Diagnosis not present

## 2023-09-16 DIAGNOSIS — Z885 Allergy status to narcotic agent status: Secondary | ICD-10-CM | POA: Diagnosis not present

## 2023-09-16 DIAGNOSIS — I2510 Atherosclerotic heart disease of native coronary artery without angina pectoris: Secondary | ICD-10-CM | POA: Diagnosis present

## 2023-09-16 DIAGNOSIS — Z808 Family history of malignant neoplasm of other organs or systems: Secondary | ICD-10-CM | POA: Diagnosis not present

## 2023-09-16 DIAGNOSIS — I459 Conduction disorder, unspecified: Secondary | ICD-10-CM | POA: Diagnosis not present

## 2023-09-16 DIAGNOSIS — M8580 Other specified disorders of bone density and structure, unspecified site: Secondary | ICD-10-CM | POA: Diagnosis present

## 2023-09-16 DIAGNOSIS — Z79899 Other long term (current) drug therapy: Secondary | ICD-10-CM | POA: Diagnosis not present

## 2023-09-16 DIAGNOSIS — Z8249 Family history of ischemic heart disease and other diseases of the circulatory system: Secondary | ICD-10-CM | POA: Diagnosis not present

## 2023-09-16 DIAGNOSIS — Z880 Allergy status to penicillin: Secondary | ICD-10-CM | POA: Diagnosis not present

## 2023-09-16 DIAGNOSIS — I110 Hypertensive heart disease with heart failure: Secondary | ICD-10-CM | POA: Diagnosis present

## 2023-09-16 DIAGNOSIS — I4430 Unspecified atrioventricular block: Secondary | ICD-10-CM | POA: Diagnosis present

## 2023-09-16 DIAGNOSIS — Z96612 Presence of left artificial shoulder joint: Secondary | ICD-10-CM | POA: Diagnosis present

## 2023-09-16 HISTORY — PX: PACEMAKER IMPLANT: EP1218

## 2023-09-16 LAB — ECHOCARDIOGRAM COMPLETE
Height: 62.992 in
S' Lateral: 3.1 cm
Weight: 2584 [oz_av]

## 2023-09-16 LAB — SURGICAL PCR SCREEN
MRSA, PCR: NEGATIVE
Staphylococcus aureus: NEGATIVE

## 2023-09-16 SURGERY — LEFT HEART CATH AND CORONARY ANGIOGRAPHY
Anesthesia: LOCAL

## 2023-09-16 SURGERY — PACEMAKER IMPLANT

## 2023-09-16 MED ORDER — FENTANYL CITRATE (PF) 100 MCG/2ML IJ SOLN
INTRAMUSCULAR | Status: AC
  Filled 2023-09-16: qty 2

## 2023-09-16 MED ORDER — SODIUM CHLORIDE 0.9 % IV SOLN: INTRAVENOUS | Status: DC

## 2023-09-16 MED ORDER — CEFAZOLIN SODIUM-DEXTROSE 1-4 GM/50ML-% IV SOLN
1.0000 g | Freq: Four times a day (QID) | INTRAVENOUS | Status: AC
  Administered 2023-09-16 – 2023-09-17 (×3): 1 g via INTRAVENOUS
  Filled 2023-09-16 (×3): qty 50

## 2023-09-16 MED ORDER — LIDOCAINE HCL 1 % IJ SOLN
INTRAMUSCULAR | Status: AC
  Filled 2023-09-16: qty 60

## 2023-09-16 MED ORDER — LIDOCAINE HCL (PF) 1 % IJ SOLN
INTRAMUSCULAR | Status: DC | PRN
  Administered 2023-09-16: 60 mL

## 2023-09-16 MED ORDER — MIDAZOLAM HCL 5 MG/5ML IJ SOLN
INTRAMUSCULAR | Status: DC | PRN
  Administered 2023-09-16: 1 mg via INTRAVENOUS

## 2023-09-16 MED ORDER — CHLORHEXIDINE GLUCONATE 4 % EX SOLN
60.0000 mL | Freq: Once | CUTANEOUS | Status: DC
  Filled 2023-09-16: qty 60

## 2023-09-16 MED ORDER — MIDAZOLAM HCL 5 MG/5ML IJ SOLN
INTRAMUSCULAR | Status: AC
Start: 2023-09-16 — End: 2023-09-16
  Filled 2023-09-16: qty 5

## 2023-09-16 MED ORDER — SODIUM CHLORIDE 0.9 % IV SOLN
INTRAVENOUS | Status: AC
  Filled 2023-09-16: qty 2

## 2023-09-16 MED ORDER — ONDANSETRON HCL 4 MG/2ML IJ SOLN: 4.0000 mg | Freq: Four times a day (QID) | INTRAMUSCULAR | Status: DC | PRN

## 2023-09-16 MED ORDER — HEPARIN (PORCINE) IN NACL 1000-0.9 UT/500ML-% IV SOLN
INTRAVENOUS | Status: DC | PRN
  Administered 2023-09-16: 500 mL

## 2023-09-16 MED ORDER — ACETAMINOPHEN 325 MG PO TABS
650.0000 mg | ORAL_TABLET | Freq: Four times a day (QID) | ORAL | Status: DC | PRN
  Administered 2023-09-16: 650 mg via ORAL
  Filled 2023-09-16: qty 2

## 2023-09-16 MED ORDER — CEFAZOLIN SODIUM-DEXTROSE 2-4 GM/100ML-% IV SOLN
INTRAVENOUS | Status: AC
  Filled 2023-09-16: qty 100

## 2023-09-16 MED ORDER — FENTANYL CITRATE (PF) 100 MCG/2ML IJ SOLN
INTRAMUSCULAR | Status: DC | PRN
  Administered 2023-09-16: 25 ug via INTRAVENOUS

## 2023-09-16 MED ORDER — CHLORHEXIDINE GLUCONATE 4 % EX SOLN: 60.0000 mL | Freq: Once | CUTANEOUS | Status: DC

## 2023-09-16 MED ORDER — SODIUM CHLORIDE 0.9 % IV SOLN
INTRAVENOUS | Status: DC
Start: 2023-09-16 — End: 2023-09-16

## 2023-09-16 MED ORDER — CEFAZOLIN SODIUM-DEXTROSE 2-4 GM/100ML-% IV SOLN
2.0000 g | INTRAVENOUS | Status: AC
  Administered 2023-09-16: 2 g via INTRAVENOUS

## 2023-09-16 MED ORDER — ACETAMINOPHEN 325 MG PO TABS: 325.0000 mg | ORAL_TABLET | ORAL | Status: DC | PRN

## 2023-09-16 MED ORDER — SODIUM CHLORIDE 0.9 % IV SOLN
80.0000 mg | INTRAVENOUS | Status: AC
  Administered 2023-09-16: 80 mg

## 2023-09-16 SURGICAL SUPPLY — 13 items
CABLE SURGICAL S-101-97-12 (CABLE) ×1 IMPLANT
CATH CPS LOCATOR 3D MED (CATHETERS) IMPLANT
KIT MICROPUNCTURE NIT STIFF (SHEATH) IMPLANT
LEAD ULTIPACE 52 LPA1231/52 (Lead) IMPLANT
LEAD ULTIPACE 65 LPA1231/65 (Lead) IMPLANT
PACEMAKER ASSURITY DR-RF (Pacemaker) IMPLANT
PAD DEFIB RADIO PHYSIO CONN (PAD) ×1 IMPLANT
SHEATH 7FR PRELUDE SNAP 13 (SHEATH) IMPLANT
SHEATH 9FR PRELUDE SNAP 13 (SHEATH) IMPLANT
SLITTER AGILIS HISPRO (INSTRUMENTS) IMPLANT
TOOL HELIX LOCKING (MISCELLANEOUS) IMPLANT
TRAY PACEMAKER INSERTION (PACKS) ×1 IMPLANT
WIRE HITORQ VERSACORE ST 145CM (WIRE) IMPLANT

## 2023-09-16 NOTE — Consult Note (Signed)
 ELECTROPHYSIOLOGY CONSULT NOTE    Patient ID: Michele Williamson MRN: [id number], DOB/AGE: [birth year] 83 y.o.  Admit date: 09/15/2023 Date of Consult: 09/16/2023  Primary Physician: Daryl Setter, NP Primary Cardiologist: None  Electrophysiologist: Dr. Nancey, new consult 09/15/13   Referring Provider: Dr. Trixie  Patient Profile: Michele Williamson is a 83 y.o. female with a history of LBBB, mod non-obs CAD on cardiac CT, HTN, HLD who is being seen today for the evaluation of symptomatic bradycardia at the request of Dr. Trixie.  HPI:  Michele Williamson is a very pleasant 83 y.o. female who presented to [facility] from the Cardiology office on 09/15/23 with reports of shortness of breath, chest pressure, lightheadedness and near syncope.    EKG in the office showed ST with CHB, LBBB at 56 bpm.  She was referred for admission.  ER evaluation notable for elevated proBNP 1,568, negative troponin, CXR with bilateral lower interstitial opacities. She was treated with IV lasix  and admitted for further evaluation by [facility].   She reports she has been feeling short of breath with exertion for 4-6 weeks which is new for her.  She has noted episodes where it felt as though a white curtain was pulled over eyes and it was gone as quickly as it came. Her son Bernerd a Marine scientist) reports she had two episodes of falling but she notes she did not pass out.  She describes a weakness that came over her and it was as if she just collapsed.  She remembers the details of the events and did not have LOC.  Denies chest pain.   She denies chest pain, palpitations, dyspnea, PND, orthopnea, nausea, vomiting, edema, weight gain, or early satiety.   Labs Potassium3.5 (07/15 1440)   Creatinine, ser  1.10* (07/15 1440) PLT  249 (07/15 1440) HGB  12.2 (07/15 1440) WBC 10.5 (07/15 1440)  .    Past Medical History:  Diagnosis Date   Arthritis    shoulder, hands , feet, knees,   Complication of anesthesia     Heart murmur    Hyperglycemia    Hyperlipemia    Hypertension    Osteopenia    PONV (postoperative nausea and vomiting)      Surgical History:  Past Surgical History:  Procedure Laterality Date   BUNIONECTOMY Right    CARPAL TUNNEL RELEASE     CATARACT EXTRACTION     CESAREAN SECTION     CHOLECYSTECTOMY  2001   REVERSE SHOULDER ARTHROPLASTY Left 06/28/2020   Procedure: REVERSE SHOULDER ARTHROPLASTY;  Surgeon: Melita Drivers, MD;  Location: [facility];  Service: Orthopedics;  Laterality: Left;    ROTATOR CUFF REPAIR Right      Medications Prior to Admission  Medication Sig Dispense Refill Last Dose/Taking   alendronate  (FOSAMAX ) 70 MG tablet Take 1 tablet (70 mg total) by mouth every 7 (seven) days. Take with a full glass of water on an empty stomach. 12 tablet 4    aspirin  EC 81 MG tablet Take 1 tablet (81 mg total) by mouth daily. Swallow whole. 90 tablet 3    CALCIUM  CARBONATE-VITAMIN D  PO Take 1 tablet by mouth daily. 600-400 mg      diclofenac  Sodium (VOLTAREN ) 1 % GEL Apply 2 g topically 4 (four) times daily as needed. 100 g 0    furosemide  (LASIX ) 80 MG tablet Take 1 tablet (80 mg total) by mouth daily. 90 tablet 1    Iron , Ferrous Sulfate , 325 (65 Fe) MG TABS Take  325 mg by mouth every other day. 30 tablet     lisinopril  (ZESTRIL ) 10 MG tablet Take 1 tablet (10 mg total) by mouth daily. Hold if blood pressure is <110. 90 tablet 1    loratadine (CLARITIN) 10 MG tablet Take 10 mg by mouth daily. [employer]      meloxicam  (MOBIC ) 7.5 MG tablet Take 1 tablet (7.5 mg total) by mouth daily. 90 tablet 3    Multiple Vitamins-Minerals (MULTIVITAMIN WITH MINERALS) tablet Take 1 tablet by mouth daily.      nortriptyline (PAMELOR) 25 MG capsule Take 25 mg by mouth at bedtime.      omeprazole  (PRILOSEC) 20 MG capsule Take 1 capsule (20 mg total) by mouth daily. 90 capsule 1    rosuvastatin  (CRESTOR ) 20 MG tablet Take 1 tablet (20 mg total) by mouth at bedtime. 90 tablet 1      Inpatient Medications:   Allergies:  Allergies  Allergen Reactions   Cat Dander    Oxycodone -Acetaminophen  Nausea Only   Penicillins     Childhood Pt received Ancef  on 06-28-20 and tolerated it well   Pregabalin     Dizziness   Tramadol Nausea And Vomiting    Family History  Problem Relation Age of Onset   Hypertension Mother    Heart disease Mother    Bone cancer Father    Hypertension Father    Hyperlipidemia Father    Melanoma Brother    Stomach cancer Maternal Grandmother    Obesity Son      Physical Exam: Vitals:   09/16/23 0204 09/16/23 0352 09/16/23 0401 09/16/23 0724  BP: 104/76  (!) 176/51 (!) 160/38  Pulse: (!) 50  98 (!) 47  Resp: 20  20 20   Temp: 98.2 F (36.8 C)  98.1 F (36.7 C) 98.4 F (36.9 C)  TempSrc: Oral  Oral Oral  SpO2: 98%  98% 95%  Weight:  73.3 kg 73.3 kg   Height:   5' 2.99 (1.6 m)     GEN- pleasant elderly female, appears younger than stated age, in NAD, A&O x 3, normal affect HEENT: Normocephalic, atraumatic Lungs- CTAB, Normal effort.  Heart- Regular rate and rhythm, No M/G/R.  GI- Soft, NT, ND.  Extremities- No clubbing, cyanosis, or edema   Radiology/Studies: DG Chest Port 1 View Result Date: 09/15/2023 CLINICAL DATA:  Shortness of breath and lightheadedness EXAM: PORTABLE CHEST 1 VIEW COMPARISON:  Chest radiograph dated 12/24/2016 FINDINGS: Normal lung volumes. Bilateral lower lung interstitial opacities. No pleural effusion or pneumothorax. The heart size and mediastinal contours are within normal limits. Left shoulder arthroplasty. IMPRESSION: Bilateral lower lung interstitial opacities, which may represent atelectasis/scarring, edema, or atypical infection. Electronically Signed   By: Limin  Xu M.D.   On: 09/15/2023 15:41    EKG:ST with CHB 48 bpm, LBBB (personally reviewed)  TELEMETRY: SR with CHB 40-50's, occ PVC.  Two different escape morphologies noted (personally reviewed)  DEVICE HISTORY:  n/a  Assessment/Plan:  Complete Heart Block  Symptomatic Bradycardia  LBBB  LVEF 60-65% in 2022, QRS 130 ms / LBBB morphology  -assess ECHO given CHB -no reversible causes identified  -avoid AV nodal blockers  -NPO for now  -plan for PPM this afternoon by Dr. Nancey with conduction system pacing -no role TVP currently, discussed with RN to notify of rate change  Hypertension  -allow some elevation in BP with CHB as is compensatory.  Anticipate will normalize after pacing  For questions or updates, please contact [facility]  Please consult [email or web address] for contact info under     Signed, Daphne Barrack, NP-C, AGACNP-BC [facility] - Electrophysiology  09/16/2023, 10:40 AM

## 2023-09-16 NOTE — Discharge Instructions (Signed)
 After Your Pacemaker   You have a Abbott Pacemaker  ACTIVITY Do not lift your arm above shoulder height for 1 week after your procedure. After 7 days, you may progress as below.  You should remove your sling 24 hours after your procedure, unless otherwise instructed by your provider.     Wednesday September 23, 2023  Thursday September 24, 2023 Friday September 25, 2023 Saturday September 26, 2023   Do not lift, push, pull, or carry anything over 10 pounds with the affected arm until 6 weeks (Wednesday October 28, 2023 ) after your procedure.   You may drive AFTER your wound check, unless you have been told otherwise by your provider.   Ask your healthcare provider when you can go back to work   INCISION/Dressing  If large square, outer bandage is left in place, this can be removed after 24 hours from your procedure. Do not remove steri-strips or glue as below.   If a PRESSURE DRESSING (a bulky dressing that usually goes up over your shoulder) was applied or left in place, please follow instructions given by your provider on when to return to have this removed.   Monitor your Pacemaker site for redness, swelling, and drainage. Call the device clinic at [phone] if you experience these symptoms or fever/chills.  If your incision is sealed with Steri-strips or staples, you may shower 7 days after your procedure or when told by your provider. Do not remove the steri-strips or let the shower hit directly on your site. You may wash around your site with soap and water.    If you were discharged in a sling, please do not wear this during the day more than 48 hours after your surgery unless otherwise instructed. This may increase the risk of stiffness and soreness in your shoulder.   Avoid lotions, ointments, or perfumes over your incision until it is well-healed.  You may use a hot tub or a pool AFTER your wound check appointment if the incision is completely closed.  Pacemaker Alerts:  Some alerts are  vibratory and others beep. These are NOT emergencies. Please call our office to let us  know. If this occurs at night or on weekends, it can wait until the next business day. Send a remote transmission.  If your device is capable of reading fluid status (for heart failure), you will be offered monthly monitoring to review this with you.   DEVICE MANAGEMENT Remote monitoring is used to monitor your pacemaker from home. This monitoring is scheduled every 91 days by our office. It allows us  to keep an eye on the functioning of your device to ensure it is working properly. You will routinely see your Electrophysiologist annually (more often if necessary).   You should receive your ID card for your new device in 4-8 weeks. Keep this card with you at all times once received. Consider wearing a medical alert bracelet or necklace.  Your Pacemaker may be MRI compatible. This will be discussed at your next office visit/wound check.  You should avoid contact with strong electric or magnetic fields.   Do not use amateur (ham) radio equipment or electric (arc) welding torches. MP3 player headphones with magnets should not be used. Some devices are safe to use if held at least 12 inches (30 cm) from your Pacemaker. These include power tools, lawn mowers, and speakers. If you are unsure if something is safe to use, ask your health care provider.  When using your cell phone, hold it  to the ear that is on the opposite side from the Pacemaker. Do not leave your cell phone in a pocket over the Pacemaker.  You may safely use electric blankets, heating pads, computers, and microwave ovens.  Call the office right away if: You have chest pain. You feel more short of breath than you have felt before. You feel more light-headed than you have felt before. Your incision starts to open up.  This information is not intended to replace advice given to you by your health care provider. Make sure you discuss any questions you  have with your health care provider.

## 2023-09-16 NOTE — ED Notes (Signed)
 Pt's dtr, Ellouise, left contact number [phone])

## 2023-09-16 NOTE — ED Notes (Signed)
 Spoke with [facility] at [facility] regarding bed assigned but delay in bed readiness. He reports bed is on list to be cleaned. Pt's dtr, Ellouise, made aware.

## 2023-09-16 NOTE — Progress Notes (Signed)
 No charge no  Patient seen and examined this morning, admitted overnight, H&P reviewed and I agree with the assessment and plan.  Is an 83 year old female with history of nonobstructive CAD on cardiac CT, HTN, HLD, left bundle branch block who comes into the hospital with shortness of breath with exertion, chest pressure, near syncope and lightheadedness, found to have complete heart block in the cardiology office.  In the ED she was given Lasix  x 1.  Evaluated this morning, she is feeling well at rest and in bed.  Reports that she is short of breath even with walking to the bathroom and back.  Telemetry reviewed this morning with concern for A-V dissociation.  Heart rate in the 40s-50s at rest.  Blood pressure elevated 150s-160s.  She is on room air, clinically stable.  Cardiology evaluation pending, continue to hold home meds until she is seen  Myana Schlup M. Trixie, MD, PhD [facility]  Between 7 am - 7 pm you can contact me via Amion (for emergencies) or Securechat (non urgent matters).  I am not available 7 pm - 7 am, please contact night coverage MD/APP via Amion

## 2023-09-16 NOTE — Progress Notes (Signed)
 Echocardiogram 2D Echocardiogram has been performed.  Damien FALCON Kasha Howeth RDCS 09/16/2023, 12:48 PM

## 2023-09-16 NOTE — Plan of Care (Signed)

## 2023-09-16 NOTE — ED Notes (Signed)
 Cellphone place on patient backpack with other belongings and given to [facility] crew.

## 2023-09-16 NOTE — ED Notes (Signed)
 Pt has from home mouth guard due to teeth grinding in place and is now attempting to get some rest.  Will continue to monitor.

## 2023-09-16 NOTE — H&P (Addendum)
 History and Physical    Michele  Michele Williamson FMW:[id number] DOB: March 07, 1940 DOA: 09/15/2023  PCP: Daryl Setter, NP  Patient coming from: cardiology office  I have personally briefly reviewed patient's old medical records in [facility]  Chief Complaint: shortness of breath with exertion associated with chest pressure, near syncope and lightheadedness   HPI: Michele  Michele Williamson is a 83 y.o. 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 Patient EKG tracing  was noted to show sinus tachycardia at atrial rate 121/min and ventricular rate 56/min with A-V dissociation consistent with complete heart block.   At that juncture patient was referred to ED.Patient notes no current dizziness, no current sob, no n/v/d.   ED Course:   IN ED patient on evaluation was also noted to have CHF exacerbation. With EKG with ? Of 2:1  block. Vitals afeb bp 156/54, hr 54, sat 96% on ra  EKG: nsr IVCD atypical LBBB 2:1 block   Wbc 10.5, hg 12.2, plt 249 Na 142, K 3.5, Cl 102 cr 1.1 CE28 Probnp [id number] Cxr MPRESSION: Bilateral lower lung interstitial opacities, which may represent atelectasis/scarring, edema, or atypical infection. Tx  Lasix  40 mg iv Review of Systems: As per HPI otherwise 10 point review of systems negative.   Past Medical History:  Diagnosis Date   Arthritis    shoulder, hands , feet, knees,   Complication of anesthesia    Heart murmur    Hyperglycemia    Hyperlipemia    Hypertension    Osteopenia    PONV (postoperative nausea and vomiting)     Past Surgical History:  Procedure Laterality Date   BUNIONECTOMY Right     CARPAL TUNNEL RELEASE     CATARACT EXTRACTION     CESAREAN SECTION     CHOLECYSTECTOMY  2001   REVERSE SHOULDER ARTHROPLASTY Left 06/28/2020   Procedure: REVERSE SHOULDER ARTHROPLASTY;  Surgeon: Melita Drivers, MD;  Location: [facility];  Service: Orthopedics;  Laterality: Left;    ROTATOR CUFF REPAIR Right      reports that she has never smoked. She has never used smokeless tobacco. She reports that she does not drink alcohol and does not use drugs.  Allergies  Allergen Reactions   Cat Dander    Oxycodone -Acetaminophen  Nausea Only   Penicillins     Childhood Pt received Ancef  on 06-28-20 and tolerated it well   Pregabalin     Dizziness   Tramadol Nausea And Vomiting    Family History  Problem Relation Age of Onset   Hypertension Mother    Heart disease Mother    Bone cancer Father    Hypertension Father    Hyperlipidemia Father    Melanoma Brother    Stomach cancer Maternal Grandmother    Obesity Son     Prior to Admission medications   Medication Sig Start Date End Date Taking? Authorizing Provider  alendronate  (FOSAMAX ) 70 MG tablet Take 1 tablet (70 mg total) by mouth every 7 (seven) days. Take with a full glass of water on an empty stomach. 04/30/22   Daryl Setter, NP  aspirin  EC 81 MG tablet Take 1 tablet (81 mg total) by mouth daily. Swallow whole. 03/09/20   Munley,  Redell PARAS, MD  CALCIUM  CARBONATE-VITAMIN D  PO Take 1 tablet by mouth daily. 600-400 mg    [provider]  diclofenac  Sodium (VOLTAREN ) 1 % GEL Apply 2 g topically 4 (four) times daily as needed. 08/25/21   Long, Joshua G, MD  furosemide  (LASIX ) 80 MG tablet Take 1 tablet (80 mg total) by mouth daily. 04/10/23   Daryl Setter, NP  Iron , Ferrous Sulfate , 325 (65 Fe) MG TABS Take 325 mg by mouth every other day. 05/01/22   O'Sullivan, Melissa, NP  lisinopril  (ZESTRIL ) 10 MG tablet Take 1 tablet (10 mg total) by mouth daily. Hold if blood pressure is <110. 09/10/23   O'Sullivan, Melissa, NP   loratadine (CLARITIN) 10 MG tablet Take 10 mg by mouth daily. [employer]    [provider]  meloxicam  (MOBIC ) 7.5 MG tablet Take 1 tablet (7.5 mg total) by mouth daily. 04/10/23   O'Sullivan, Melissa, NP  Multiple Vitamins-Minerals (MULTIVITAMIN WITH MINERALS) tablet Take 1 tablet by mouth daily.    [provider]  nortriptyline (PAMELOR) 25 MG capsule Take 25 mg by mouth at bedtime.    [provider]  omeprazole  (PRILOSEC) 20 MG capsule Take 1 capsule (20 mg total) by mouth daily. 04/10/23   O'Sullivan, Melissa, NP  rosuvastatin  (CRESTOR ) 20 MG tablet Take 1 tablet (20 mg total) by mouth at bedtime. 04/10/23   Daryl Setter, NP    Physical Exam: Vitals:   09/16/23 0145 09/16/23 0204 09/16/23 0352 09/16/23 0401  BP: (!) 146/103 104/76  (!) 176/51  Pulse:  (!) 50  98  Resp: 20 20  20   Temp:  98.2 F (36.8 C)  98.1 F (36.7 C)  TempSrc:  Oral  Oral  SpO2:  98%  98%  Weight:   73.3 kg 73.3 kg  Height:    5' 2.99 (1.6 m)    Constitutional: NAD, calm, comfortable Vitals:   09/16/23 0145 09/16/23 0204 09/16/23 0352 09/16/23 0401  BP: (!) 146/103 104/76  (!) 176/51  Pulse:  (!) 50  98  Resp: 20 20  20   Temp:  98.2 F (36.8 C)  98.1 F (36.7 C)  TempSrc:  Oral  Oral  SpO2:  98%  98%  Weight:   73.3 kg 73.3 kg  Height:    5' 2.99 (1.6 m)   Eyes: PERRL, lids and conjunctivae normal ENMT: Mucous membranes are moist. Posterior pharynx clear of any exudate or lesions.Normal dentition.  Neck: normal, supple, no masses, no thyromegaly Respiratory: clear to auscultation bilaterally, no wheezing, no crackles. Normal respiratory effort. No accessory muscle use.  Cardiovascular: Regular rate and rhythm, no murmurs / rubs / gallops. No extremity edema. 2+ pedal pulses Abdomen: no tenderness, no masses palpated. No hepatosplenomegaly. Bowel sounds positive.  Musculoskeletal: no clubbing / cyanosis. No joint deformity upper and lower extremities. Good ROM, no  contractures. Normal muscle tone.  Skin: no rashes, lesions, ulcers. No induration Neurologic: CN grossly intact. Sensation intact, . Strength 5/5 in all 4.  Psychiatric: Normal judgment and insight. Alert and oriented x 3. Normal mood.    Labs on Admission: I have personally reviewed following labs and imaging studies  CBC: Recent Labs  Lab 09/15/23 1440  WBC 10.5  HGB 12.2  HCT 37.2  MCV 92.8  PLT 249   Basic Metabolic Panel: Recent Labs  Lab 09/15/23 1440  NA 142  K 3.5  CL 102  CO2 26  GLUCOSE 122*  BUN 27*  CREATININE 1.10*  CALCIUM  9.7  GFR: Estimated Creatinine Clearance: 39.2 mL/min (A) (by C-G formula based on SCr of 1.1 mg/dL (H)). Liver Function Tests: No results for input(s): AST, ALT, ALKPHOS, BILITOT, PROT, ALBUMIN in the last 168 hours. No results for input(s): LIPASE, AMYLASE in the last 168 hours. No results for input(s): AMMONIA in the last 168 hours. Coagulation Profile: No results for input(s): INR, PROTIME in the last 168 hours. Cardiac Enzymes: No results for input(s): CKTOTAL, CKMB, CKMBINDEX, TROPONINI in the last 168 hours. BNP (last 3 results) Recent Labs    09/15/23 1440  PROBNP 1,568.0*   HbA1C: No results for input(s): HGBA1C in the last 72 hours. CBG: No results for input(s): GLUCAP in the last 168 hours. Lipid Profile: No results for input(s): CHOL, HDL, LDLCALC, TRIG, CHOLHDL, LDLDIRECT in the last 72 hours. Thyroid  Function Tests: No results for input(s): TSH, T4TOTAL, FREET4, T3FREE, THYROIDAB in the last 72 hours. Anemia Panel: No results for input(s): VITAMINB12, FOLATE, FERRITIN, TIBC, IRON , RETICCTPCT in the last 72 hours. Urine analysis: No results found for: COLORURINE, APPEARANCEUR, LABSPEC, PHURINE, GLUCOSEU, HGBUR, BILIRUBINUR, KETONESUR, PROTEINUR, UROBILINOGEN, NITRITE, LEUKOCYTESUR  Radiological Exams on Admission: DG  Chest Port 1 View Result Date: 09/15/2023 CLINICAL DATA:  Shortness of breath and lightheadedness EXAM: PORTABLE CHEST 1 VIEW COMPARISON:  Chest radiograph dated 12/24/2016 FINDINGS: Normal lung volumes. Bilateral lower lung interstitial opacities. No pleural effusion or pneumothorax. The heart size and mediastinal contours are within normal limits. Left shoulder arthroplasty. IMPRESSION: Bilateral lower lung interstitial opacities, which may represent atelectasis/scarring, edema, or atypical infection. Electronically Signed   By: Limin  Xu M.D.   On: 09/15/2023 15:41    EKG: Independently reviewed.  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 -continue to monitor on tele  - await further cardiology recs   CAD nonobstructive  - noted on CT  -Abnormal CE , presumed demand  - continue cycle , f/u with echo in am   HLD -continue statin    Hypertension, Uncontrolled  -resume home regimen with lisinopril     DVT prophylaxis: heparin  Code Status: full/ as discussed per patient wishes in event of cardiac arrest  Family Communication: none at bedside Disposition Plan: patient  expected to be admitted greater than 2 midnights  Consults called: Cardiology  Admission status: progressive care    Camila DELENA Ned MD [facility]   If 7PM-7AM, please contact night-coverage [email or web address] Password [facility]  09/16/2023, 4:32 AM

## 2023-09-16 NOTE — Progress Notes (Signed)
 Mobility Specialist Progress Note:   09/16/23 0915  Mobility  Activity Ambulated with assistance in room  Level of Assistance Contact guard assist, steadying assist  Assistive Device Cane  Distance Ambulated (ft) 30 ft  Activity Response Tolerated well  Mobility Referral Yes  Mobility visit 1 Mobility  Mobility Specialist Start Time (ACUTE ONLY) 0915  Mobility Specialist Stop Time (ACUTE ONLY) 0930  Mobility Specialist Time Calculation (min) (ACUTE ONLY) 15 min   Pt agreeable to mobility session. Required only minG assist to ambulate in room with cane. Session limited by low HR and fatigue. HR 35-60 throughout. Back in bed with all needs met.   Therisa Rana Mobility Specialist Please contact via SecureChat or  Rehab office at [phone]

## 2023-09-17 ENCOUNTER — Inpatient Hospital Stay (HOSPITAL_COMMUNITY)

## 2023-09-17 DIAGNOSIS — I459 Conduction disorder, unspecified: Secondary | ICD-10-CM | POA: Diagnosis not present

## 2023-09-17 LAB — COMPREHENSIVE METABOLIC PANEL WITH GFR
ALT: 12 U/L (ref 0–44)
AST: 22 U/L (ref 15–41)
Albumin: 3.5 g/dL (ref 3.5–5.0)
Alkaline Phosphatase: 44 U/L (ref 38–126)
Anion gap: 10 (ref 5–15)
BUN: 16 mg/dL (ref 8–23)
CO2: 24 mmol/L (ref 22–32)
Calcium: 9 mg/dL (ref 8.9–10.3)
Chloride: 108 mmol/L (ref 98–111)
Creatinine, Ser: 0.99 mg/dL (ref 0.44–1.00)
GFR, Estimated: 58 mL/min — ABNORMAL LOW (ref >60)
Glucose, Bld: 107 mg/dL — ABNORMAL HIGH (ref 70–99)
Potassium: 3.7 mmol/L (ref 3.5–5.1)
Sodium: 142 mmol/L (ref 135–145)
Total Bilirubin: 0.6 mg/dL (ref 0.0–1.2)
Total Protein: 6.5 g/dL (ref 6.5–8.1)

## 2023-09-17 LAB — CBC
HCT: 36 % (ref 36.0–46.0)
Hemoglobin: 11.9 g/dL — ABNORMAL LOW (ref 12.0–15.0)
MCH: 30.6 pg (ref 26.0–34.0)
MCHC: 33.1 g/dL (ref 30.0–36.0)
MCV: 92.5 fL (ref 80.0–100.0)
Platelets: 223 K/uL (ref 150–400)
RBC: 3.89 MIL/uL (ref 3.87–5.11)
RDW: 15.2 % (ref 11.5–15.5)
WBC: 9.7 K/uL (ref 4.0–10.5)
nRBC: 0 % (ref 0.0–0.2)

## 2023-09-17 LAB — MAGNESIUM: Magnesium: 2.2 mg/dL (ref 1.7–2.4)

## 2023-09-17 NOTE — Progress Notes (Signed)
 Heart Failure Navigator Progress Note  Assessed for Heart & Vascular TOC clinic readiness.  Patient does not meet criteria due to . - admission related to complete heart block 7/16 55-60%, No HF TOC   Navigator will sign off at this time.   Stephane Haddock, BSN, Scientist, clinical (histocompatibility and immunogenetics) Only

## 2023-09-17 NOTE — Discharge Summary (Signed)
 Physician Discharge Summary  Michele  SHALISHA Williamson FMW:[id number] DOB: 01/05/1941 DOA: 09/15/2023  PCP: Daryl Setter, NP  Admit date: 09/15/2023 Discharge date: 09/17/2023  Admitted From: home Disposition:  home  Recommendations for Outpatient Follow-up:  Follow up with PCP in 1-2 weeks Follow-up with cardiology as an outpatient   Home Health: none Equipment/Devices: none  Discharge Condition: stable CODE STATUS: Full code Diet Orders (From admission, onward)     Start     Ordered   09/16/23 1521  Diet Heart Room service appropriate? Yes; Fluid consistency: Thin  Diet effective now       Question Answer Comment  Room service appropriate? Yes   Fluid consistency: Thin      09/16/23 1520            HPI: Per admitting MD, Michele Williamson is a 83 y.o. 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 Patient EKG tracing  was noted to show sinus tachycardia at atrial rate 121/min and ventricular rate 56/min with A-V dissociation consistent with complete heart block.   At that juncture patient was referred to ED.Patient notes no current dizziness, no current sob, no n/v/d.   Hospital Course / Discharge diagnoses: Principal problem Symptomatic complete heart block -patient admitted to the hospital with symptoms of CHF, lightheadedness, dizziness, and was found to be in complete heart block.  Cardiology consulted and followed patient while hospitalized, he underwent pacemaker placement on 7/16.  She recovered well postoperatively, symptoms have resolved, will be discharged home in stable condition with outpatient follow-up with  cardiology  Active problems CAD-nonobstructive, mild troponin elevation, likely demand.  2D echocardiogram showed normal LVEF without wall motion abnormalities.  She had no chest pain and this is stable.  Will resume home medications Hyperlipidemia-continue home medications Essential hypertension-continue home medications  Sepsis ruled out   Discharge Instructions   Allergies as of 09/17/2023       Reactions   Cat Dander    Oxycodone -acetaminophen  Nausea Only   Penicillins    Childhood Pt received Ancef  on 06-28-20 and tolerated it well   Pregabalin    Dizziness   Tramadol Nausea And Vomiting        Medication List     PAUSE taking these medications    aspirin  EC 81 MG tablet Wait to take this until: September 19, 2023 Take 1 tablet (81 mg total) by mouth daily. Swallow whole.       TAKE these medications    alendronate  70 MG tablet Commonly known as: FOSAMAX  Take 1 tablet (70 mg total) by mouth every 7 (seven) days. Take with a full glass of water on an empty stomach.   CALCIUM  CARBONATE-VITAMIN D  PO Take 1 tablet by mouth daily. 600-400 mg   diclofenac  Sodium 1 % Gel Commonly known as: Voltaren  Apply 2 g topically 4 (four) times daily as needed. What changed: reasons to take this   furosemide  80 MG tablet Commonly known as: LASIX  Take 1 tablet (80 mg total) by mouth daily.   Iron  (Ferrous Sulfate ) 325 (65 Fe) MG Tabs Take 325 mg by mouth every other day.   lisinopril  10 MG tablet Commonly known as: ZESTRIL  Take 1 tablet (10 mg total) by mouth daily.  Hold if blood pressure is <110.   loratadine 10 MG tablet Commonly known as: CLARITIN Take 10 mg by mouth daily. [employer]   meloxicam  7.5 MG tablet Commonly known as: MOBIC  Take 1 tablet (7.5 mg total) by mouth daily.   multivitamin with minerals tablet Take 1 tablet by mouth daily.   omeprazole  20 MG capsule Commonly known as: PRILOSEC Take 1 capsule (20 mg total) by mouth daily.    rosuvastatin  20 MG tablet Commonly known as: CRESTOR  Take 1 tablet (20 mg total) by mouth at bedtime. What changed: when to take this       Consultations: Cardiology   Procedures/Studies:  DG Chest 2 View Result Date: 09/17/2023 CLINICAL DATA:  730013 Cardiac device in situ, other [id number] EXAM: CHEST - 2 VIEW COMPARISON:  September 15, 2023 FINDINGS: Interval placement of a left chest pacemaker with leads terminating in the right atrium right ventricle. No focal airspace consolidation, pleural effusion, or pneumothorax. No cardiomegaly. Tortuous aorta with aortic atherosclerosis. No acute fracture or destructive lesions. Multilevel thoracic osteophytosis. Left shoulder arthroplasty. IMPRESSION: Interval placement of a left chest pacemaker.  No pneumothorax. Electronically Signed   By: Rogelia Myers M.D.   On: 09/17/2023 09:00   EP PPM/ICD IMPLANT Result Date: 09/16/2023 Table formatting from the original result was not included.  SURGEON:  Eulas Furbish, MD    PREPROCEDURE DIAGNOSES:  1. Irreversible symptomatic bradycardia due to complete heart block    POSTPROCEDURE DIAGNOSES:  1. Irreversible symptomatic bradycardia due to complete heart block    PROCEDURES:   1. Dual chamber permanent pacemaker implantation    INTRODUCTION: Michele Williamson is a 83 y.o. patient who presents to the EP lab for dual chamber permanent pacemaker implantation.    DESCRIPTION OF PROCEDURE:  Informed written consent was obtained and the patient was brought to the electrophysiology lab in the fasting state. The patient was adequately sedated with intravenous Versed , and fentanyl  as outlined in the nursing report.  The patient's left chest was prepped and draped in the usual sterile fashion by the EP lab staff.  The skin overlying the left deltopectoral region was infiltrated with lidocaine  for local analgesia.  An incision was created over the left deltopectoral region.  A left subcutaneous pocket was fashioned using a  combination of sharp and blunt dissection immediately superficial to the pectoral fascia.  Electrocautery was used to assure hemostasis. Lead Placement: The left axillary vein was cannulated using modified seldinger techniques.  Through the left axillary vein, an RV pace/sense lead was advanced to the RV mid septum and advanced deeply, pausing intermittently to assess lead parameters. The lead was secured to the pectoral fascia. Next, the right atrial lead was advanced to the RA appendage. It was secured to the pectoral fascia.  Lead parameters are detailed below.  RA RV Model 1231 1231 Serial # [id number] Amplitude > 5 mV 6.3 mV Threshold 0.1.25 V @ 0.5 S 1.0 V @ 0.5 S Impedence 440 ohms 740 ohms The pocket was irrigated with copious gentamicin  solution.  The leads were then  connected to a pulse generator (model Assurity MRI, serial [id number]). The pocket was closed in layers of absorbable suture. EBL < 10mL. Steri-strips and a sterile dressing were applied.  During this procedure the patient is administered Versed  and Fentanyl  by a trained provider under my direct supervision to achieve and maintain moderate conscious sedation.  The patient's heart rate, blood pressure, and oxygen saturation are monitored continuously during the procedure. The  period of conscious sedation is 31 minutes, of which I was present face-to-face 100% of this time.    CONCLUSIONS:  1. Successful dual chamber permanent pacemaker implantation  3.  No early apparent complications. Eulas Furbish, MD Cardiac Electrophysiology   ECHOCARDIOGRAM COMPLETE Result Date: 09/16/2023    ECHOCARDIOGRAM REPORT   Patient Name:   Michele Williamson Date of Exam: 09/16/2023 Medical Rec #:  [id number]        Height:       63.0 in Accession #:    [id number]       Weight:       161.5 lb Date of Birth:  [birth year]        BSA:          1.766 m Patient Age:    82 years         BP:           160/51 mmHg Patient Gender: F                HR:           48  bpm. Exam Location:  Inpatient Procedure: 2D Echo, Color Doppler and Cardiac Doppler (Both Spectral and Color            Flow Doppler were utilized during procedure). Indications:    I44.2 Complete heart block  History:        Patient has prior history of Echocardiogram examinations, most                 recent 04/04/2020. CHF; Risk Factors:Hypertension and                 Dyslipidemia.  Sonographer:    Damien Senior RDCS Referring Phys: [id number] DAPHNE CROME OLLIS IMPRESSIONS  1. Patient heart rhythm shows evidence of AV dissociation/complete heart block. Left ventricular ejection fraction, by estimation, is 55 to 60%. The left ventricle has normal function. The left ventricle has no regional wall motion abnormalities. Left ventricular diastolic parameters are indeterminate.  2. Right ventricular systolic function is normal. The right ventricular size is normal. Tricuspid regurgitation signal is inadequate for assessing PA pressure.  3. The mitral valve is normal in structure. No evidence of mitral valve regurgitation. No evidence of mitral stenosis.  4. The aortic valve is normal in structure. Aortic valve regurgitation is not visualized. No aortic stenosis is present.  5. The inferior vena cava is normal in size with greater than 50% respiratory variability, suggesting right atrial pressure of 3 mmHg. FINDINGS  Left Ventricle: Patient heart rhythm shows evidence of AV dissociation/complete heart block. Left ventricular ejection fraction, by estimation, is 55 to 60%. The left ventricle has normal function. The left ventricle has no regional wall motion abnormalities. The left ventricular internal cavity size was normal in size. There is no left ventricular hypertrophy. Left ventricular diastolic parameters are indeterminate. Right Ventricle: The right ventricular size is normal. No increase in right ventricular wall thickness. Right ventricular systolic function is normal. Tricuspid regurgitation signal is inadequate for  assessing PA pressure. Left Atrium: Left atrial size was normal in size. Right Atrium: Right atrial size was normal in size. Pericardium: There is no evidence of pericardial effusion. Mitral Valve: The mitral valve is normal in structure. No evidence of mitral valve regurgitation. No evidence of mitral valve stenosis. Tricuspid Valve: The tricuspid valve is normal in structure. Tricuspid valve regurgitation is not demonstrated. No evidence of tricuspid stenosis. Aortic Valve: The aortic valve is normal in  structure. Aortic valve regurgitation is not visualized. No aortic stenosis is present. Pulmonic Valve: The pulmonic valve was normal in structure. Pulmonic valve regurgitation is not visualized. No evidence of pulmonic stenosis. Aorta: The aortic root is normal in size and structure. Venous: The inferior vena cava is normal in size with greater than 50% respiratory variability, suggesting right atrial pressure of 3 mmHg. IAS/Shunts: No atrial level shunt detected by color flow Doppler.  LEFT VENTRICLE PLAX 2D LVIDd:         4.70 cm LVIDs:         3.10 cm LV PW:         1.00 cm LV IVS:        1.10 cm LVOT diam:     1.80 cm LV SV:         86 LV SV Index:   49 LVOT Area:     2.54 cm  RIGHT VENTRICLE RV S prime:     14.40 cm/s TAPSE (M-mode): 2.4 cm LEFT ATRIUM             Index        RIGHT ATRIUM           Index LA diam:        3.30 cm 1.87 cm/m   RA Area:     13.80 cm LA Vol (A2C):   47.5 ml 26.90 ml/m  RA Volume:   31.00 ml  17.56 ml/m LA Vol (A4C):   44.8 ml 25.37 ml/m LA Biplane Vol: 47.2 ml 26.73 ml/m  AORTIC VALVE LVOT Vmax:   129.00 cm/s LVOT Vmean:  102.000 cm/s LVOT VTI:    0.337 m  AORTA Ao Root diam: 2.90 cm  SHUNTS Systemic VTI:  0.34 m Systemic Diam: 1.80 cm Kardie Tobb DO Electronically signed by Dub Huntsman DO Signature Date/Time: 09/16/2023/2:16:40 PM    Final    DG Chest Port 1 View Result Date: 09/15/2023 CLINICAL DATA:  Shortness of breath and lightheadedness EXAM: PORTABLE CHEST 1 VIEW  COMPARISON:  Chest radiograph dated 12/24/2016 FINDINGS: Normal lung volumes. Bilateral lower lung interstitial opacities. No pleural effusion or pneumothorax. The heart size and mediastinal contours are within normal limits. Left shoulder arthroplasty. IMPRESSION: Bilateral lower lung interstitial opacities, which may represent atelectasis/scarring, edema, or atypical infection. Electronically Signed   By: Limin  Xu M.D.   On: 09/15/2023 15:41     Subjective: - no chest pain, shortness of breath, no abdominal pain, nausea or vomiting.   Discharge Exam: BP (!) 161/57 (BP Location: Right Arm)   Pulse 97   Temp 97.9 F (36.6 C) (Oral)   Resp 19   Ht 5' 2.99 (1.6 m)   Wt 72.8 kg   SpO2 96%   BMI 28.46 kg/m   General: Pt is alert, awake, not in acute distress Cardiovascular: RRR, S1/S2 +, no rubs, no gallops Respiratory: CTA bilaterally, no wheezing, no rhonchi Abdominal: Soft, NT, ND, bowel sounds + Extremities: no edema, no cyanosis    The results of significant diagnostics from this hospitalization (including imaging, microbiology, ancillary and laboratory) are listed below for reference.     Microbiology: Recent Results (from the past 240 hours)  Surgical PCR screen     Status: None   Collection Time: 09/16/23  1:51 PM   Specimen: Nasal Mucosa; Nasal Swab  Result Value Ref Range Status   MRSA, PCR NEGATIVE NEGATIVE Final   Staphylococcus aureus NEGATIVE NEGATIVE Final    Comment: (NOTE) The Xpert SA Assay (  FDA approved for NASAL specimens in patients 41 years of age and older), is one component of a comprehensive surveillance program. It is not intended to diagnose infection nor to guide or monitor treatment. Performed at [facility], [address] [place], [place] [address]      Labs: Basic Metabolic Panel: Recent Labs  Lab 09/15/23 1440 09/17/23 0235  NA 142 142  K 3.5 3.7  CL 102 108  CO2 26 24  GLUCOSE 122* 107*  BUN 27* 16  CREATININE 1.10*  0.99  CALCIUM  9.7 9.0  MG  --  2.2   Liver Function Tests: Recent Labs  Lab 09/17/23 0235  AST 22  ALT 12  ALKPHOS 44  BILITOT 0.6  PROT 6.5  ALBUMIN 3.5   CBC: Recent Labs  Lab 09/15/23 1440 09/17/23 0235  WBC 10.5 9.7  HGB 12.2 11.9*  HCT 37.2 36.0  MCV 92.8 92.5  PLT 249 223   CBG: No results for input(s): GLUCAP in the last 168 hours. Hgb A1c No results for input(s): HGBA1C in the last 72 hours. Lipid Profile No results for input(s): CHOL, HDL, LDLCALC, TRIG, CHOLHDL, LDLDIRECT in the last 72 hours. Thyroid  function studies No results for input(s): TSH, T4TOTAL, T3FREE, THYROIDAB in the last 72 hours.  Invalid input(s): FREET3 Urinalysis No results found for: COLORURINE, APPEARANCEUR, LABSPEC, PHURINE, GLUCOSEU, HGBUR, BILIRUBINUR, KETONESUR, PROTEINUR, UROBILINOGEN, NITRITE, LEUKOCYTESUR  FURTHER DISCHARGE INSTRUCTIONS:   Get Medicines reviewed and adjusted: Please take all your medications with you for your next visit with your Primary MD   Laboratory/radiological data: Please request your Primary MD to go over all hospital tests and procedure/radiological results at the follow up, please ask your Primary MD to get all Hospital records sent to his/her office.   In some cases, they will be blood work, cultures and biopsy results pending at the time of your discharge. Please request that your primary care M.D. goes through all the records of your hospital data and follows up on these results.   Also Note the following: If you experience worsening of your admission symptoms, develop shortness of breath, life threatening emergency, suicidal or homicidal thoughts you must seek medical attention immediately by calling 911 or calling your MD immediately  if symptoms less severe.   You must read complete instructions/literature along with all the possible adverse reactions/side effects for all the Medicines you take  and that have been prescribed to you. Take any new Medicines after you have completely understood and accpet all the possible adverse reactions/side effects.    Do not drive when taking Pain medications or sleeping medications (Benzodaizepines)   Do not take more than prescribed Pain, Sleep and Anxiety Medications. It is not advisable to combine anxiety,sleep and pain medications without talking with your primary care practitioner   Special Instructions: If you have smoked or chewed Tobacco  in the last 2 yrs please stop smoking, stop any regular Alcohol  and or any Recreational drug use.   Wear Seat belts while driving.   Please note: You were cared for by a hospitalist during your hospital stay. Once you are discharged, your primary care physician will handle any further medical issues. Please note that NO REFILLS for any discharge medications will be authorized once you are discharged, as it is imperative that you return to your primary care physician (or establish a relationship with a primary care physician if you do not have one) for your post hospital discharge needs so that they  can reassess your need for medications and monitor your lab values.  Time coordinating discharge: 35 minutes  SIGNED:  Nilda Fendt, MD, PhD 09/17/2023, 9:28 AM

## 2023-09-17 NOTE — Progress Notes (Signed)
 Patient denies chest pain or sob. Vss. All belongings and paperwork given to patient.

## 2023-09-17 NOTE — Progress Notes (Signed)
  Patient Name: Michele  [name] Date of Encounter: 09/17/2023  Primary Cardiologist: None Electrophysiologist: None  Interval Summary   The patient is doing well today.  She is anxious to go home.  At this time, the patient denies chest pain, shortness of breath, or any new concerns.  Vital Signs    Vitals:   09/16/23 2002 09/17/23 0053 09/17/23 0548 09/17/23 0735  BP: (!) 146/74 133/70 (!) 163/68 (!) 161/57  Pulse: (!) 102 (!) 104 (!) 101 97  Resp: 19 17 19 19   Temp: 98.2 F (36.8 C) 98.9 F (37.2 C) 98.2 F (36.8 C) 97.9 F (36.6 C)  TempSrc: Oral Oral Oral Oral  SpO2: 96% 98% 93% 96%  Weight:   72.8 kg   Height:        Intake/Output Summary (Last 24 hours) at 09/17/2023 0916 Last data filed at 09/17/2023 0900 Gross per 24 hour  Intake 240 ml  Output 600 ml  Net -360 ml   Filed Weights   09/16/23 0352 09/16/23 0401 09/17/23 0548  Weight: 73.3 kg 73.3 kg 72.8 kg    Physical Exam    GEN- NAD, Alert and oriented  Lungs- Clear to ausculation bilaterally, normal work of breathing Cardiac- Regular rate and rhythm, no murmurs, rubs or gallops. PPM site with steri strips intact, small amt swelling / hematoma  GI- soft, NT, ND, + BS Extremities- no clubbing or cyanosis. No edema  Telemetry    Appropriate VP 80-90's (personally reviewed)  Hospital Course    Michele  [name] is a 83 y.o. female with a history of LBBB, mod non-obs CAD on cardiac CT, HTN, HLD admitted on 09/15/23 with reports of shortness of breath, chest pressure, lightheadedness and near syncope. Found to have CHB, s/p placement of Abbott dual chamber PPM on 09/16/23.  CXR post placement wnl.   Assessment & Plan    Complete Heart Block  Symptomatic Bradycardia  LBBB  LVEF 55-60%, no RWMA, QRS 130 ms / LBBB morphology  -post PPM instructions given to patient  -EP follow up arranged  -CXR reviewed, appropriate placement of leads / device check wnl  -she is not on OAC   Hypertension  -per [facility]        For questions or updates, please contact [facility] Please consult [email or web address] for contact info under     Signed, Daphne Barrack, NP-C, AGACNP-BC [facility] - Electrophysiology  09/17/2023, 9:17 AM

## 2023-09-17 NOTE — TOC CM/SW Note (Signed)
 Transition of Care [facility]) - Inpatient Brief Assessment   Patient Details  Name: Michele Williamson MRN: [id number] Date of Birth: [birth year]  [facility]) CM/SW Contact:    Waddell Barnie Rama, RN Phone Number: 09/17/2023, 10:22 AM   Clinical Narrative: From home with grandson, has PCP and insurance on file, states has no HH services in place at this time , has a cane at home.  States family member will transport them home at [employer] and family is support system, states gets medications from [facility] on [address].  Pta self ambulatory.      Transition of Care Asessment: Insurance and Status: Insurance coverage has been reviewed Patient has primary care physician: Yes Home environment has been reviewed: home with grandson Prior level of function:: indep Prior/Current Home Services: No current home services Social Drivers of Health Review: SDOH reviewed no interventions necessary Readmission risk has been reviewed: Yes Transition of care needs: no transition of care needs at this time

## 2023-09-17 NOTE — TOC Transition Note (Signed)
 Transition of Care [facility]) - Discharge Note   Patient Details  Name: Michele  NAQUITA Williamson MRN: [id number] Date of Birth: 04-11-1940  Transition of Care [facility]) CM/SW Contact:  Waddell Barnie Rama, RN Phone Number: 09/17/2023, 10:23 AM   Clinical Narrative:    For dc today, she has transport at bedside.         Patient Goals and CMS Choice            Discharge Placement                       Discharge Plan and Services Additional resources added to the After Visit Summary for                                       Social Drivers of Health (SDOH) Interventions SDOH Screenings   Food Insecurity: No Food Insecurity (09/16/2023)  Housing: Low Risk  (09/16/2023)  Transportation Needs: No Transportation Needs (09/16/2023)  Utilities: Not At Risk (09/16/2023)  Alcohol Screen: Low Risk  (03/19/2022)  Depression (PHQ2-9): Medium Risk (06/12/2023)  Financial Resource Strain: Low Risk  (08/04/2022)  Physical Activity: Unknown (08/04/2022)  Social Connections: Moderately Integrated (09/16/2023)  Stress: Stress Concern Present (08/04/2022)  Tobacco Use: Low Risk  (09/15/2023)     Readmission Risk Interventions    09/17/2023   10:21 AM  Readmission Risk Prevention Plan  Post Dischage Appt Complete  Medication Screening Complete  Transportation Screening Complete

## 2023-09-18 ENCOUNTER — Telehealth: Payer: Self-pay

## 2023-09-18 NOTE — Transitions of Care (Post Inpatient/ED Visit) (Signed)
   09/18/2023  Name: Michele  LAIYLAH Williamson MRN: [id number] DOB: 10/09/1940  Today's TOC FU Call Status: Today's TOC FU Call Status:: Unsuccessful Call (1st Attempt) Unsuccessful Call (1st Attempt) Date: 09/18/23  Attempted to reach the patient regarding the most recent Inpatient/ED visit.  Follow Up Plan: Additional outreach attempts will be made to reach the patient to complete the Transitions of Care (Post Inpatient/ED visit) call.   Medford Balboa, BSN, RN [place]  VBCI - [facility] RN Care Manager [phone]

## 2023-09-18 NOTE — Telephone Encounter (Signed)
 Follow-up after same day discharge: Implant date: 09/16/2023 MD: Eulas Furbish Device: Abbott Pacemaker Location: Left Chest   Wound check visit: 09/29/2023 90 day MD follow-up: 12/23/2023  Remote Transmission received:not yet  Dressing/sling removed: no sling or dressing  Confirm OAC restart on: restarts Aspirin  Saturday.  Please continue to monitor your cardiac device site for redness, swelling, and drainage. Call the device clinic at [phone] if you experience these symptoms, fever/chills, or have questions about your device.   Remote monitoring is used to monitor your cardiac device from home. This monitoring is scheduled every 91 days by our office. It allows us  to keep an eye on the functioning of your device to ensure it is working properly.  Pt is overall doing well. She has lots of energy.

## 2023-09-21 ENCOUNTER — Telehealth: Payer: Self-pay

## 2023-09-21 NOTE — Transitions of Care (Post Inpatient/ED Visit) (Signed)
   09/21/2023  Name: Michele  CARLY Williamson MRN: [id number] DOB: 12/24/1940  Today's TOC FU Call Status: Today's TOC FU Call Status:: Unsuccessful Call (2nd Attempt) Unsuccessful Call (1st Attempt) Date: 09/18/23 Unsuccessful Call (2nd Attempt) Date: 09/21/23  Attempted to reach the patient regarding the most recent Inpatient/ED visit.  Follow Up Plan: Additional outreach attempts will be made to reach the patient to complete the Transitions of Care (Post Inpatient/ED visit) call.   Medford Balboa, BSN, RN [place]  VBCI - [facility] RN Care Manager [phone]

## 2023-09-22 ENCOUNTER — Telehealth: Payer: Self-pay

## 2023-09-22 NOTE — Transitions of Care (Post Inpatient/ED Visit) (Signed)
   09/22/2023  Name: Michele Williamson MRN: [id number] DOB: [birth year]  Today's TOC FU Call Status: Today's TOC FU Call Status:: Unsuccessful Call (3rd Attempt) Unsuccessful Call (3rd Attempt) Date: 09/22/23  Attempted to reach the patient regarding the most recent Inpatient/ED visit. Unable to leave voicemail message on phone listed for detailed message per DPR.  Follow Up Plan: No further outreach attempts will be made at this time. We have been unable to contact the patient.  Richerd Fish, RN, BSN, CCM [facility], [facility] RN Care Manager Direct Dial: [phone]

## 2023-09-28 ENCOUNTER — Encounter: Payer: Self-pay | Admitting: Podiatry

## 2023-09-28 ENCOUNTER — Ambulatory Visit: Admitting: Podiatry

## 2023-09-28 ENCOUNTER — Ambulatory Visit (INDEPENDENT_AMBULATORY_CARE_PROVIDER_SITE_OTHER)

## 2023-09-28 VITALS — Ht 63.0 in | Wt 160.6 lb

## 2023-09-28 DIAGNOSIS — M7742 Metatarsalgia, left foot: Secondary | ICD-10-CM

## 2023-09-28 DIAGNOSIS — M2011 Hallux valgus (acquired), right foot: Secondary | ICD-10-CM

## 2023-09-28 DIAGNOSIS — M7751 Other enthesopathy of right foot: Secondary | ICD-10-CM | POA: Diagnosis not present

## 2023-09-28 DIAGNOSIS — M21611 Bunion of right foot: Secondary | ICD-10-CM

## 2023-09-28 DIAGNOSIS — M7752 Other enthesopathy of left foot: Secondary | ICD-10-CM | POA: Diagnosis not present

## 2023-09-28 DIAGNOSIS — M19072 Primary osteoarthritis, left ankle and foot: Secondary | ICD-10-CM

## 2023-09-28 DIAGNOSIS — M19071 Primary osteoarthritis, right ankle and foot: Secondary | ICD-10-CM

## 2023-09-28 DIAGNOSIS — M2012 Hallux valgus (acquired), left foot: Secondary | ICD-10-CM | POA: Diagnosis not present

## 2023-09-28 DIAGNOSIS — M21612 Bunion of left foot: Secondary | ICD-10-CM

## 2023-09-28 DIAGNOSIS — M2042 Other hammer toe(s) (acquired), left foot: Secondary | ICD-10-CM | POA: Diagnosis not present

## 2023-09-28 DIAGNOSIS — M7741 Metatarsalgia, right foot: Secondary | ICD-10-CM

## 2023-09-28 DIAGNOSIS — M2041 Other hammer toe(s) (acquired), right foot: Secondary | ICD-10-CM

## 2023-09-28 NOTE — Progress Notes (Addendum)
 Subjective:  Patient ID: Michele  ODEAN Williamson, female    DOB: November 27, 1940,  MRN: [id number]  Chief Complaint  Patient presents with   Foot Pain    RM 19 Patient is here for bilateral foot pain. Pain has been present for several years. Patient states pain located ball of feet and toes.    Discussed the use of AI scribe software for clinical note transcription with the patient, who gave verbal consent to proceed.  History of Present Illness Michele  [name] is an 83 year old female with significant bunions and hammer toes who presents with foot pain and numbness.  She experiences significant pain in both feet, primarily localized to the bunions and hammer toes, described as a squeezing sensation, akin to 'somebody's just got a mace on it and just squeezing.' Additionally, there is numbness in her toes. The pain is persistent, and she takes pain medication throughout the day, although she does not recall the names of the medications.  She has a history of surgeries on her right foot, with the last surgery performed at [facility] approximately 12 to 15 years ago. Despite these surgeries, the deformities have recurred. She was scheduled for corrective surgery on her left foot, but this was postponed due to recent cardiac issues requiring a pacemaker placement two weeks ago.  She has a history of heart issues, including a recent pacemaker placement due to arrhythmia characterized by unequal heart rates. No diabetes.  She reports using orthotics in the past, but they did not provide relief and were uncomfortable due to the height of her toes. She has tried topical medications like Voltaren  gel, which provide minimal relief.  During the review of symptoms, she denies pain when her foot joints are moved, but describes a sensation of pressure rather than pain in the arch and ball of her foot. She reports feeling faint sensations when her feet are touched.      Objective:    Physical  Exam VASCULAR: DP and PT pulse palpable. Foot is warm and well-perfused. Capillary fill time is brisk. DERMATOLOGIC: Normal skin turgor, texture, and temperature. No open lesions, rashes, or ulcerations. NEUROLOGIC: Sensation intact to light touch and pressure. No paresthesias. ORTHOPEDIC: Hallux valgus deformity on left foot and second hammer toe crossover toe deformity. Prominence of metatarsal heads on both feet with diffuse tenderness across the ball of the foot. Smooth pain-free range of motion of all examined joints. No ecchymosis or bruising. No gross deformity. No pain to palpation.   No images are attached to the encounter.    Results RADIOLOGY Foot radiographs: Osteoarthritic changes in the midfoot and forefoot, severe hallux valgus, and hammer toe deformities on the left foot (09/28/2023).   Assessment:   1. Metatarsalgia of both feet   2. Hallux valgus with bunions, left   3. Hallux valgus with bunions, right   4. Hammertoe of left foot   5. Hammertoe of right foot   6. Primary osteoarthritis of both feet      Plan:  Patient was evaluated and treated and all questions answered.  Assessment and Plan Assessment & Plan Osteoarthritis of feet Osteoarthritis is present in the midfoot and forefoot, contributing to pain and discomfort. The condition is chronic and affects the joints, particularly in the toes and arch. Significant pain and pressure are exacerbated by the deformities in her feet. 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 These deformities contribute to pain and pressure, and surgical correction was  planned but postponed due to recent cardiac issues. She is hesitant to pursue surgery due to age and potential risks. - Discuss surgical options with the patient once cardiac status is stable - Consider non-surgical management with supportive footwear and orthotics  Metatarsalgia Metatarsalgia is present with diffuse tenderness across the ball of the foot. This condition is likely multifactorial, with contributions from arthritis, nerve dysfunction, and foot deformities. - Recommend supportive shoes and orthotic insoles to alleviate pressure  Neuropathy Neuropathy likely contributes to numbness and tight sensation in the feet. The condition may be related to age and possibly nerve dysfunction. Pain management is the primary focus as there are limited options for treating numbness. - Focus on pain management strategies - Encourage staying active and walking for exercise to maintain circulation  Arrhythmia with pacemaker She recently had a pacemaker implanted due to arrhythmia characterized by unequal heart rates. This has impacted her ability to proceed with planned foot surgery.      Return if symptoms worsen or fail to improve.

## 2023-09-29 ENCOUNTER — Ambulatory Visit: Attending: Internal Medicine

## 2023-09-29 DIAGNOSIS — I442 Atrioventricular block, complete: Secondary | ICD-10-CM | POA: Diagnosis not present

## 2023-09-29 LAB — CUP PACEART INCLINIC DEVICE CHECK
Battery Remaining Longevity: 117 mo
Battery Voltage: 3.05 V
Brady Statistic RA Percent Paced: 0 %
Brady Statistic RV Percent Paced: 99.99 %
Date Time Interrogation Session: 20250729102627
Implantable Lead Connection Status: 753985
Implantable Lead Connection Status: 753985
Implantable Lead Implant Date: 20250716
Implantable Lead Implant Date: 20250716
Implantable Lead Location: 753859
Implantable Lead Location: 753860
Implantable Pulse Generator Implant Date: 20250716
Lead Channel Impedance Value: 400 Ohm
Lead Channel Impedance Value: 587.5 Ohm
Lead Channel Pacing Threshold Amplitude: 0.75 V
Lead Channel Pacing Threshold Amplitude: 0.75 V
Lead Channel Pacing Threshold Amplitude: 0.75 V
Lead Channel Pacing Threshold Pulse Width: 0.5 ms
Lead Channel Pacing Threshold Pulse Width: 0.5 ms
Lead Channel Pacing Threshold Pulse Width: 0.5 ms
Lead Channel Sensing Intrinsic Amplitude: 5 mV
Lead Channel Setting Pacing Amplitude: 1 V
Lead Channel Setting Pacing Amplitude: 3.5 V
Lead Channel Setting Pacing Pulse Width: 0.5 ms
Lead Channel Setting Sensing Sensitivity: 2 mV
Pulse Gen Model: 2272
Pulse Gen Serial Number: 8285101

## 2023-09-29 NOTE — Progress Notes (Signed)
 Normal dual chamber pacemaker wound check. Presenting rhythm: AS/VP 94 . Wound well healed. Routine testing performed. Thresholds, sensing, and impedances consistent with implant measurements. Four AMS episodes c/w 1:1 AT and FFOS noted. Reviewed arm restrictions to continue for 6 weeks total post op.  Pt enrolled in remote follow-up. Instructed patient to send activation transmission from remote monitor after arriving home.   Programming Changes: - PVAB changed from to to cover up FFOS.

## 2023-09-29 NOTE — Patient Instructions (Signed)
   After Your Pacemaker   Monitor your pacemaker site for redness, swelling, and drainage. Call the device clinic at [phone] if you experience these symptoms or fever/chills.  Your incision was closed with Steri-strips or staples:  You may shower 7 days after your procedure and wash your incision with soap and water. Avoid lotions, ointments, or perfumes over your incision until it is well-healed.  You may use a hot tub or a pool after your wound check appointment if the incision is completely closed.  Do not lift, push or pull greater than 10 pounds with the affected arm until 6 weeks after your procedure. UNTIL AFTER AUGUST 27TH. There are no other restrictions in arm movement after your wound check appointment.  You may drive, unless driving has been restricted by your healthcare providers.  Remote monitoring is used to monitor your pacemaker from home. This monitoring is scheduled every 91 days by our office. It allows us  to keep an eye on the functioning of your device to ensure it is working properly. You will routinely see your Electrophysiologist annually (more often if necessary).

## 2023-09-30 ENCOUNTER — Ambulatory Visit (INDEPENDENT_AMBULATORY_CARE_PROVIDER_SITE_OTHER): Admitting: Family

## 2023-09-30 VITALS — BP 125/67 | HR 103 | Temp 98.7°F | Resp 16 | Ht 63.0 in | Wt 155.0 lb

## 2023-09-30 DIAGNOSIS — D649 Anemia, unspecified: Secondary | ICD-10-CM

## 2023-09-30 DIAGNOSIS — G629 Polyneuropathy, unspecified: Secondary | ICD-10-CM

## 2023-09-30 DIAGNOSIS — I10 Essential (primary) hypertension: Secondary | ICD-10-CM

## 2023-09-30 DIAGNOSIS — Z8679 Personal history of other diseases of the circulatory system: Secondary | ICD-10-CM

## 2023-09-30 LAB — CBC WITH DIFFERENTIAL/PLATELET
Basophils Absolute: 0.1 K/uL (ref 0.0–0.1)
Basophils Relative: 0.7 % (ref 0.0–3.0)
Eosinophils Absolute: 0.2 K/uL (ref 0.0–0.7)
Eosinophils Relative: 2.9 % (ref 0.0–5.0)
HCT: 41.1 % (ref 36.0–46.0)
Hemoglobin: 13.6 g/dL (ref 12.0–15.0)
Lymphocytes Relative: 21.6 % (ref 12.0–46.0)
Lymphs Abs: 1.8 K/uL (ref 0.7–4.0)
MCHC: 33.1 g/dL (ref 30.0–36.0)
MCV: 90.8 fl (ref 78.0–100.0)
Monocytes Absolute: 0.7 K/uL (ref 0.1–1.0)
Monocytes Relative: 9.2 % (ref 3.0–12.0)
Neutro Abs: 5.4 K/uL (ref 1.4–7.7)
Neutrophils Relative %: 65.6 % (ref 43.0–77.0)
Platelets: 256 K/uL (ref 150.0–400.0)
RBC: 4.53 Mil/uL (ref 3.87–5.11)
RDW: 14.4 % (ref 11.5–15.5)
WBC: 8.2 K/uL (ref 4.0–10.5)

## 2023-09-30 LAB — BASIC METABOLIC PANEL WITH GFR
BUN: 16 mg/dL (ref 6–23)
CO2: 28 meq/L (ref 19–32)
Calcium: 10.2 mg/dL (ref 8.4–10.5)
Chloride: 103 meq/L (ref 96–112)
Creatinine, Ser: 0.91 mg/dL (ref 0.40–1.20)
GFR: 59.41 mL/min — ABNORMAL LOW (ref >60.00)
Glucose, Bld: 99 mg/dL (ref 70–99)
Potassium: 4.6 meq/L (ref 3.5–5.1)
Sodium: 140 meq/L (ref 135–145)

## 2023-09-30 LAB — B12 AND FOLATE PANEL
Folate: >23.4 ng/mL (ref >5.9)
Vitamin B-12: 404 pg/mL (ref 211–911)

## 2023-09-30 MED ORDER — GABAPENTIN 100 MG PO CAPS: 100.0000 mg | ORAL_CAPSULE | Freq: Three times a day (TID) | ORAL | 0 refills | Status: DC

## 2023-09-30 MED ORDER — GABAPENTIN 100 MG PO CAPS: 100.0000 mg | ORAL_CAPSULE | Freq: Three times a day (TID) | ORAL | 3 refills | Status: DC

## 2023-09-30 NOTE — Assessment & Plan Note (Signed)
 S/p PPM placement. Doing much better.  Continues to follow with cardiology.

## 2023-09-30 NOTE — Progress Notes (Signed)
 Subjective:     Patient ID: Michele  SHARRY Williamson, female    DOB: Jun 19, 1940, 83 y.o.   MRN: [id number]  Chief Complaint  Patient presents with   Hypertension    Here for follow up    Hypertension    Discussed the use of AI scribe software for clinical note transcription with the patient, who gave verbal consent to proceed.  History of Present Illness  Michele  [name] is an 83 year old female who presents for follow-up after pacemaker placement. She experienced a symptomatic episode of complete heart block, leading to a cardiology consultation and an emergency department visit for pacemaker placement. The procedure and recovery were smooth, and she was discharged the following day. She feels well since the pacemaker placement with no current shortness of breath.   She was volume overloaded prior to PPM placement. She continues to take furosemide  80 mg once daily as prior to admission. Her weight has decreased. Recent labs showed elevation of troponin and BNP in the setting of complete heart block.   She has chronic pain in both feet, primarily in the soles, described as a constant squeezing sensation, sometimes radiating to her knees at night. She reports a hx of neuropathy and arthritis. She previously tried gabapentin  300 mg but discontinued it due to nausea. She uses Voltaren  gel on her knees and feet for pain relief.      Health Maintenance Due  Topic Date Due   Medicare Annual Wellness (AWV)  03/20/2023   COVID-19 Vaccine (9 - 2024-25 season) 06/02/2023    Past Medical History:  Diagnosis Date   Arthritis    shoulder, hands , feet, knees,   Complication of anesthesia    Heart murmur    Hyperglycemia    Hyperlipemia    Hypertension    Osteopenia    PONV (postoperative nausea and vomiting)     Past Surgical History:  Procedure Laterality Date   BUNIONECTOMY Right    CARPAL TUNNEL RELEASE     CATARACT EXTRACTION     CESAREAN SECTION     CHOLECYSTECTOMY  2001    PACEMAKER IMPLANT N/A 09/16/2023   Procedure: PACEMAKER IMPLANT;  Surgeon: Nancey Eulas BRAVO, MD;  Location: [facility] INVASIVE [facility];  Service: Cardiovascular;  Laterality: N/A;   REVERSE SHOULDER ARTHROPLASTY Left 06/28/2020   Procedure: REVERSE SHOULDER ARTHROPLASTY;  Surgeon: Melita Drivers, MD;  Location: [facility] ORS;  Service: Orthopedics;  Laterality: Left;    ROTATOR CUFF REPAIR Right     Family History  Problem Relation Age of Onset   Hypertension Mother    Heart disease Mother    Bone cancer Father    Hypertension Father    Hyperlipidemia Father    Melanoma Brother    Stomach cancer Maternal Grandmother    Obesity Son     Social History   Socioeconomic History   Marital status: Widowed    Spouse name: Not on file   Number of children: Not on file   Years of education: Not on file   Highest education level: 12th grade  Occupational History   Not on file  Tobacco Use   Smoking status: Never   Smokeless tobacco: Never  Vaping Use   Vaping status: Never Used  Substance and Sexual Activity   Alcohol use: No   Drug use: No   Sexual activity: Yes    Birth control/protection: None  Other Topics Concern   Not on file  Social History Narrative   Lives in  [place]   Has an adopted son born 10-28-2008   Husband died in October 29, 2006   Retired, worked as an Insurance underwriter   Enjoys senior center   Has a [place]   Completed HS   Social Drivers of Corporate investment banker Strain: Low Risk  (08/04/2022)   Overall Financial Resource Strain (CARDIA)    Difficulty of Paying Living Expenses: Not hard at all  Food Insecurity: No Food Insecurity (09/16/2023)   Hunger Vital Sign    Worried About Running Out of Food in the Last Year: Never true    Ran Out of Food in the Last Year: Never true  Transportation Needs: No Transportation Needs (09/16/2023)   PRAPARE - Administrator, Civil Service (Medical): No    Lack of Transportation (Non-Medical): No  Physical Activity:  Unknown (08/04/2022)   Exercise Vital Sign    Days of Exercise per Week: 0 days    Minutes of Exercise per Session: Not on file  Stress: Stress Concern Present (08/04/2022)   [employer] of Occupational Health - Occupational Stress Questionnaire    Feeling of Stress : Rather much  Social Connections: Moderately Integrated (09/16/2023)   Social Connection and Isolation Panel    Frequency of Communication with Friends and Family: More than three times a week    Frequency of Social Gatherings with Friends and Family: More than three times a week    Attends Religious Services: More than 4 times per year    Active Member of [employer] or Organizations: Yes    Attends Banker Meetings: More than 4 times per year    Marital Status: Widowed  Intimate Partner Violence: Not At Risk (09/16/2023)   Humiliation, Afraid, Rape, and Kick questionnaire    Fear of Current or Ex-Partner: No    Emotionally Abused: No    Physically Abused: No    Sexually Abused: No    Outpatient Medications Prior to Visit  Medication Sig Dispense Refill   alendronate  (FOSAMAX ) 70 MG tablet Take 1 tablet (70 mg total) by mouth every 7 (seven) days. Take with a full glass of water on an empty stomach. 12 tablet 4   aspirin  EC 81 MG tablet Take 1 tablet (81 mg total) by mouth daily. Swallow whole. 90 tablet 3   CALCIUM  CARBONATE-VITAMIN D  PO Take 1 tablet by mouth daily. 600-400 mg     diclofenac  Sodium (VOLTAREN ) 1 % GEL Apply 2 g topically 4 (four) times daily as needed. (Patient taking differently: Apply 2 g topically 4 (four) times daily as needed (for pain).) 100 g 0   furosemide  (LASIX ) 80 MG tablet Take 1 tablet (80 mg total) by mouth daily. 90 tablet 1   Iron , Ferrous Sulfate , 325 (65 Fe) MG TABS Take 325 mg by mouth every other day. 30 tablet    lisinopril  (ZESTRIL ) 10 MG tablet Take 1 tablet (10 mg total) by mouth daily. Hold if blood pressure is <110. 90 tablet 1   loratadine (CLARITIN) 10 MG tablet Take  10 mg by mouth daily. [employer]     meloxicam  (MOBIC ) 7.5 MG tablet Take 1 tablet (7.5 mg total) by mouth daily. 90 tablet 3   Multiple Vitamins-Minerals (MULTIVITAMIN WITH MINERALS) tablet Take 1 tablet by mouth daily.     omeprazole  (PRILOSEC) 20 MG capsule Take 1 capsule (20 mg total) by mouth daily. 90 capsule 1   rosuvastatin  (CRESTOR ) 20 MG tablet Take 1 tablet (20 mg total) by mouth at  bedtime. (Patient taking differently: Take 20 mg by mouth daily.) 90 tablet 1   No facility-administered medications prior to visit.    Allergies  Allergen Reactions   Cat Dander    Oxycodone -Acetaminophen  Nausea Only   Penicillins     Childhood Pt received Ancef  on 06-28-20 and tolerated it well   Pregabalin     Dizziness   Tramadol Nausea And Vomiting    ROS See HPI    Lab Results  Component Value Date   HGBA1C 5.8 02/11/2023    Objective:    Physical Exam Constitutional:      General: She is not in acute distress.    Appearance: Normal appearance. She is well-developed.  HENT:     Head: Normocephalic and atraumatic.     Right Ear: External ear normal.     Left Ear: External ear normal.  Eyes:     General: No scleral icterus. Neck:     Thyroid : No thyromegaly.  Cardiovascular:     Rate and Rhythm: Normal rate and regular rhythm.     Heart sounds: Normal heart sounds. No murmur heard. Pulmonary:     Effort: Pulmonary effort is normal. No respiratory distress.     Breath sounds: Normal breath sounds. No wheezing.  Musculoskeletal:     Cervical back: Neck supple.  Skin:    General: Skin is warm and dry.  Neurological:     Mental Status: She is alert and oriented to person, place, and time.  Psychiatric:        Mood and Affect: Mood normal.        Behavior: Behavior normal.        Thought Content: Thought content normal.        Judgment: Judgment normal.      BP 125/67 (BP Location: Right Arm, Patient Position: Sitting, Cuff Size: Normal)   Pulse (!) 103   Temp  98.7 F (37.1 C) (Oral)   Resp 16   Ht 5' 3 (1.6 m)   Wt 155 lb (70.3 kg)   SpO2 97%   BMI 27.46 kg/m  Wt Readings from Last 3 Encounters:  09/30/23 155 lb (70.3 kg)  09/28/23 160 lb 9.6 oz (72.8 kg)  09/17/23 160 lb 9.6 oz (72.8 kg)       Assessment & Plan:   Problem List Items Addressed This Visit       Unprioritized   Neuropathy   Check b12 and folate level. Retrial gabapentin  at 100 mg tid to see if this is better tolerated.        Relevant Medications   gabapentin  (NEURONTIN ) 100 MG capsule   Other Relevant Orders   B12 and Folate Panel   Hypertension - Primary   BP stable on lisinopril . Continue same.       Relevant Orders   Basic Metabolic Panel (BMET)   History of complete heart block   S/p PPM placement. Doing much better.  Continues to follow with cardiology.       Other Visit Diagnoses       Anemia, unspecified type       Relevant Orders   CBC w/Diff       I am having Thomas  EMERSON Molly maintain her loratadine, aspirin  EC, multivitamin with minerals, diclofenac  Sodium, CALCIUM  CARBONATE-VITAMIN D  PO, alendronate , Iron  (Ferrous Sulfate ), furosemide , meloxicam , rosuvastatin , omeprazole , lisinopril , and gabapentin .  Meds ordered this encounter  Medications   DISCONTD: gabapentin  (NEURONTIN ) 100 MG capsule    Sig: Take 1 capsule (100  mg total) by mouth 3 (three) times daily.    Dispense:  90 capsule    Refill:  3    Supervising Provider:   DOMENICA BLACKBIRD A [[id number]]   gabapentin  (NEURONTIN ) 100 MG capsule    Sig: Take 1 capsule (100 mg total) by mouth 3 (three) times daily.    Dispense:  180 capsule    Refill:  0    Supervising Provider:   DOMENICA BLACKBIRD A [[id number]]

## 2023-09-30 NOTE — Assessment & Plan Note (Signed)
 BP stable on lisinopril. Continue same.

## 2023-09-30 NOTE — Assessment & Plan Note (Signed)
 Check b12 and folate level. Retrial gabapentin  at 100 mg tid to see if this is better tolerated.

## 2023-09-30 NOTE — Patient Instructions (Signed)
 VISIT SUMMARY:  You had a follow-up visit after your pacemaker placement. You are feeling well with no symptoms of heart block or shortness of breath. We discussed your heart failure and chronic foot pain.  YOUR PLAN:  COMPLETE HEART BLOCK STATUS POST PACEMAKER PLACEMENT: You had a pacemaker placed due to heart block and are currently experiencing no symptoms or complications. -No changes needed at this time.  HEART FAILURE: Your heart failure symptoms have improved since the pacemaker placement, and your weight loss is likely due to reduced fluid retention. -Continue taking furosemide  80 mg once daily. -We will repeat your blood count today.  CHRONIC BILATERAL FOOT PAIN WITH NEUROPATHY AND OSTEOARTHRITIS: You have chronic pain in your feet due to neuropathy and osteoarthritis. You previously tried gabapentin  but had to stop due to nausea. -Start taking gabapentin  100 mg. You can increase the dose to three times daily if you tolerate it well. -We will check your B12 level. -Your gabapentin  prescription will be sent to [facility] for 30 days and the remainder to [facility].

## 2023-10-01 ENCOUNTER — Ambulatory Visit: Payer: Self-pay | Admitting: Family

## 2023-10-12 ENCOUNTER — Ambulatory Visit: Payer: Self-pay | Admitting: Cardiovascular Disease

## 2023-10-19 ENCOUNTER — Encounter: Payer: Self-pay | Admitting: Family

## 2023-10-19 DIAGNOSIS — M545 Low back pain, unspecified: Secondary | ICD-10-CM

## 2023-10-25 ENCOUNTER — Emergency Department (HOSPITAL_COMMUNITY)
Admission: EM | Admit: 2023-10-25 | Discharge: 2023-10-25 | Disposition: A | Discharge status: Home / Self Care | Attending: Emergency Medicine | Admitting: Emergency Medicine

## 2023-10-25 ENCOUNTER — Other Ambulatory Visit: Payer: Self-pay

## 2023-10-25 ENCOUNTER — Encounter (HOSPITAL_COMMUNITY): Payer: Self-pay

## 2023-10-25 DIAGNOSIS — I2510 Atherosclerotic heart disease of native coronary artery without angina pectoris: Secondary | ICD-10-CM | POA: Insufficient documentation

## 2023-10-25 DIAGNOSIS — Z79899 Other long term (current) drug therapy: Secondary | ICD-10-CM | POA: Diagnosis not present

## 2023-10-25 DIAGNOSIS — Z7982 Long term (current) use of aspirin: Secondary | ICD-10-CM | POA: Diagnosis not present

## 2023-10-25 DIAGNOSIS — M5416 Radiculopathy, lumbar region: Secondary | ICD-10-CM | POA: Diagnosis not present

## 2023-10-25 DIAGNOSIS — I10 Essential (primary) hypertension: Secondary | ICD-10-CM | POA: Insufficient documentation

## 2023-10-25 DIAGNOSIS — I959 Hypotension, unspecified: Secondary | ICD-10-CM | POA: Insufficient documentation

## 2023-10-25 DIAGNOSIS — E860 Dehydration: Secondary | ICD-10-CM | POA: Diagnosis not present

## 2023-10-25 DIAGNOSIS — M549 Dorsalgia, unspecified: Secondary | ICD-10-CM | POA: Diagnosis not present

## 2023-10-25 LAB — I-STAT CG4 LACTIC ACID, ED
Lactic Acid, Venous: 1.9 mmol/L (ref 0.5–1.9)
Lactic Acid, Venous: 1.1 mmol/L (ref 0.5–1.9)

## 2023-10-25 LAB — URINALYSIS, W/ REFLEX TO CULTURE (INFECTION SUSPECTED)
Bilirubin Urine: NEGATIVE
Glucose, UA: NEGATIVE mg/dL
Hgb urine dipstick: NEGATIVE
Ketones, ur: NEGATIVE mg/dL
Nitrite: POSITIVE — AB
Protein, ur: NEGATIVE mg/dL
Specific Gravity, Urine: 1.002 — ABNORMAL LOW (ref 1.005–1.030)
pH: 6 (ref 5.0–8.0)

## 2023-10-25 LAB — TROPONIN I (HIGH SENSITIVITY)
Troponin I (High Sensitivity): 11 ng/L (ref <18)
Troponin I (High Sensitivity): 9 ng/L (ref <18)

## 2023-10-25 LAB — COMPREHENSIVE METABOLIC PANEL WITH GFR
ALT: 13 U/L (ref 0–44)
AST: 24 U/L (ref 15–41)
Albumin: 3.7 g/dL (ref 3.5–5.0)
Alkaline Phosphatase: 49 U/L (ref 38–126)
Anion gap: 8 (ref 5–15)
BUN: 16 mg/dL (ref 8–23)
CO2: 21 mmol/L — ABNORMAL LOW (ref 22–32)
Calcium: 9.3 mg/dL (ref 8.9–10.3)
Chloride: 105 mmol/L (ref 98–111)
Creatinine, Ser: 1.12 mg/dL — ABNORMAL HIGH (ref 0.44–1.00)
GFR, Estimated: 49 mL/min — ABNORMAL LOW (ref >60)
Glucose, Bld: 126 mg/dL — ABNORMAL HIGH (ref 70–99)
Potassium: 4.2 mmol/L (ref 3.5–5.1)
Sodium: 134 mmol/L — ABNORMAL LOW (ref 135–145)
Total Bilirubin: 0.5 mg/dL (ref 0.0–1.2)
Total Protein: 6.7 g/dL (ref 6.5–8.1)

## 2023-10-25 LAB — CBC WITH DIFFERENTIAL/PLATELET
Abs Immature Granulocytes: 0.04 K/uL (ref 0.00–0.07)
Basophils Absolute: 0.1 K/uL (ref 0.0–0.1)
Basophils Relative: 1 %
Eosinophils Absolute: 0.2 K/uL (ref 0.0–0.5)
Eosinophils Relative: 2 %
HCT: 40.5 % (ref 36.0–46.0)
Hemoglobin: 12.9 g/dL (ref 12.0–15.0)
Immature Granulocytes: 1 %
Lymphocytes Relative: 23 %
Lymphs Abs: 2 K/uL (ref 0.7–4.0)
MCH: 30.4 pg (ref 26.0–34.0)
MCHC: 31.9 g/dL (ref 30.0–36.0)
MCV: 95.3 fL (ref 80.0–100.0)
Monocytes Absolute: 0.7 K/uL (ref 0.1–1.0)
Monocytes Relative: 8 %
Neutro Abs: 5.8 K/uL (ref 1.7–7.7)
Neutrophils Relative %: 65 %
Platelets: 265 K/uL (ref 150–400)
RBC: 4.25 MIL/uL (ref 3.87–5.11)
RDW: 14.3 % (ref 11.5–15.5)
WBC: 8.7 K/uL (ref 4.0–10.5)
nRBC: 0 % (ref 0.0–0.2)

## 2023-10-25 LAB — MAGNESIUM: Magnesium: 2 mg/dL (ref 1.7–2.4)

## 2023-10-25 MED ORDER — SODIUM CHLORIDE 0.9 % IV BOLUS
1000.0000 mL | Freq: Once | INTRAVENOUS | Status: AC
  Administered 2023-10-25: 1000 mL via INTRAVENOUS

## 2023-10-25 MED ORDER — ONDANSETRON HCL 4 MG/2ML IJ SOLN
4.0000 mg | Freq: Once | INTRAMUSCULAR | Status: AC
  Administered 2023-10-25: 4 mg via INTRAVENOUS
  Filled 2023-10-25: qty 2

## 2023-10-25 MED ORDER — MORPHINE SULFATE 15 MG PO TABS: 7.5000 mg | ORAL_TABLET | Freq: Four times a day (QID) | ORAL | 0 refills | Status: DC | PRN

## 2023-10-25 MED ORDER — FENTANYL CITRATE PF 50 MCG/ML IJ SOSY
50.0000 ug | PREFILLED_SYRINGE | Freq: Once | INTRAMUSCULAR | Status: AC
  Administered 2023-10-25: 50 ug via INTRAVENOUS
  Filled 2023-10-25: qty 1

## 2023-10-25 NOTE — ED Notes (Addendum)
Gave patient sandwich and a drink

## 2023-10-25 NOTE — ED Provider Notes (Signed)
 [place] [facility]MENT AT [facility] Provider Note   CSN: [id number] Arrival date & time: 10/25/23  1524     Patient presents with: Buttocks Pain   Michele Williamson is a 83 y.o. female.  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  Patient reports that she has had issues with ongoing back pain and is pain in the back that goes into the left leg that in the end of January early February she saw a neurosurgeon in the [place] area and had injections in her back which significantly helped with her pain.  She had been doing well but in the last 2 weeks started to have pain in the same area again.  However in the last 4 to 5 days she has had significant worsening pain now that is moved to the inner groin on the left leg making it very uncomfortable for her all the time.  She is still walking with her cane but her family member reports that she has to bend over.  She also admits that she has not been eating or drinking a whole lot because she has been in too much pain.  She is taking gabapentin , meloxicam  for the pain but it is not helping.  She was having difficulty getting into see a neurosurgeon here in town and so had made an appointment for next week to be seen back in [place] where she had been seen in the past.  She reports the last MRI of her back she had was not earlier this year.  However she has had multiple falls since that time due to the heart block and syncope.  The last time she fell was about a month ago just prior to getting her pacemaker.  She was not having pain at that time but she started a few weeks later.  She denies  fever, cough, shortness of breath, feeling dizzy, chest pain, abdominal pain, vomiting or diarrhea.  She denies any numbness or weakness in the leg but always has tingling in her feet which is unchanged.  She has had no recent change in her medications and denies taking any anticoagulants.  She did take her lisinopril  this morning.  She has not had any syncope since pacemaker placement.  The history is provided by the patient, medical records and a relative.       Prior to Admission medications   Medication Sig Start Date End Date Taking? Authorizing Provider  alendronate  (FOSAMAX ) 70 MG tablet Take 1 tablet (70 mg total) by mouth every 7 (seven) days. Take with a full glass of water on an empty stomach. 04/30/22   Daryl Setter, NP  aspirin  EC 81 MG tablet Take 1 tablet (81 mg total) by mouth daily. Swallow whole. 03/09/20   Monetta Redell PARAS, MD  CALCIUM  CARBONATE-VITAMIN D  PO Take 1 tablet by mouth daily. 600-400 mg    [provider]  diclofenac  Sodium (VOLTAREN ) 1 % GEL Apply 2 g topically 4 (four) times daily as needed. Patient taking differently: Apply 2 g topically 4 (four) times daily as needed (for pain). 08/25/21   Long, Fonda MATSU, MD  furosemide  (LASIX ) 80 MG tablet Take 1 tablet (80  mg total) by mouth daily. 04/10/23   O'Sullivan, Melissa, NP  gabapentin  (NEURONTIN ) 100 MG capsule Take 1 capsule (100 mg total) by mouth 3 (three) times daily. 09/30/23   O'Sullivan, Melissa, NP  Iron , Ferrous Sulfate , 325 (65 Fe) MG TABS Take 325 mg by mouth every other day. 05/01/22   O'Sullivan, Melissa, NP  lisinopril  (ZESTRIL ) 10 MG tablet Take 1 tablet (10 mg total) by mouth daily. Hold if blood pressure is <110. 09/10/23   O'Sullivan, Melissa, NP  loratadine (CLARITIN) 10 MG tablet Take 10 mg by mouth daily. [employer]    [provider]  meloxicam  (MOBIC ) 7.5 MG tablet Take 1 tablet (7.5 mg total) by mouth daily. 04/10/23   O'Sullivan, Melissa, NP  Multiple Vitamins-Minerals  (MULTIVITAMIN WITH MINERALS) tablet Take 1 tablet by mouth daily.    [provider]  omeprazole  (PRILOSEC) 20 MG capsule Take 1 capsule (20 mg total) by mouth daily. 04/10/23   O'Sullivan, Melissa, NP  rosuvastatin  (CRESTOR ) 20 MG tablet Take 1 tablet (20 mg total) by mouth at bedtime. Patient taking differently: Take 20 mg by mouth daily. 04/10/23   Daryl Setter, NP    Allergies: Cat dander, Oxycodone -acetaminophen , Penicillins, Pregabalin, and Tramadol    Review of Systems  Updated Vital Signs BP (!) 64/40 (BP Location: Right Arm)   Pulse 73   Temp (!) 97.4 F (36.3 C)   Resp 16   SpO2 98%   Physical Exam Vitals and nursing note reviewed.  Constitutional:      General: She is not in acute distress.    Appearance: She is well-developed.     Comments: Appears slightly uncomfortable changing position frequently in bed  HENT:     Head: Normocephalic and atraumatic.  Eyes:     Pupils: Pupils are equal, round, and reactive to light.  Cardiovascular:     Rate and Rhythm: Normal rate and regular rhythm.     Pulses: Normal pulses.     Heart sounds: Normal heart sounds. No murmur heard.    No friction rub.  Pulmonary:     Effort: Pulmonary effort is normal.     Breath sounds: Normal breath sounds. No wheezing or rales.     Comments: Pacemaker in the left upper chest appears to be well-healed Chest:     Chest wall: No tenderness.  Abdominal:     General: Bowel sounds are normal. There is no distension.     Palpations: Abdomen is soft.     Tenderness: There is no abdominal tenderness. There is no right CVA tenderness, left CVA tenderness, guarding or rebound.  Musculoskeletal:        General: Normal range of motion.     Comments: No significant edema in the lower extremities.  No pain with palpation to the lower extremities.  Feet are both warm with normal capillary refill.  Skin:    General: Skin is warm and dry.     Findings: No rash.  Neurological:     Mental  Status: She is alert and oriented to person, place, and time.     Cranial Nerves: No cranial nerve deficit.     Comments: Able to move bilateral legs without any difficulty.  Sensation is intact.  Mentating normally.  Psychiatric:        Mood and Affect: Mood normal.        Behavior: Behavior normal.     (all labs ordered are listed, but only abnormal results are displayed) Labs Reviewed  CBC  WITH DIFFERENTIAL/PLATELET  COMPREHENSIVE METABOLIC PANEL WITH GFR  URINALYSIS, W/ REFLEX TO CULTURE (INFECTION SUSPECTED)  MAGNESIUM  LACTIC ACID, PLASMA  LACTIC ACID, PLASMA  TROPONIN I (HIGH SENSITIVITY)    EKG: None  Radiology: No results found.  {Document cardiac monitor, telemetry assessment procedure when appropriate:32947} Procedures   Medications Ordered in the ED  sodium chloride  0.9 % bolus 1,000 mL (has no administration in time range)      {Click here for ABCD2, HEART and other calculators REFRESH Note before signing:1}                              Medical Decision Making Amount and/or Complexity of Data Reviewed Labs: ordered.   Pt with multiple medical problems and comorbidities and presenting today with a complaint that caries a high risk for morbidity and mortality.  Here today with the above complaints.  Patient reports the back pain is very similar to prior except now has a medial component to the pain as well.  She has pulses present in both feet and is able to move the leg without significant difficulty.  Concern for lumbar radiculopathy.  She has had no new incontinence or retention.  Lower suspicion for cauda equina at this time.  However patient is also hypotensive today with pressures in the 70s.  She denies feeling lightheaded or any syncope.  She is taking blood pressure medication and last took her lisinopril  this morning.  She does report decreased oral intake so concern for electrolyte abnormalities, AKI.  She denies any infectious symptoms but will also  look for heart strain and infection.  Patient does not take any anticoagulants other than 81 mg aspirin  and feel low likelihood of a epidural hematoma.  Low suspicion for DVT.  Concern for possible AAA.  Started patient on a fluid bolus and will reassess blood pressure.   {Document critical care time when appropriate  Document review of labs and clinical decision tools ie CHADS2VASC2, etc  Document your independent review of radiology images and any outside records  Document your discussion with family members, caretakers and with consultants  Document social determinants of health affecting pt's care  Document your decision making why or why not admission, treatments were needed:32947:::1}   Final diagnoses:  None    ED Discharge Orders     None

## 2023-10-25 NOTE — ED Triage Notes (Signed)
 Pt c.o left buttocks pain x 3 weeks. Pt was told she had a pinched nerve and was given several injections for the pain. Pt has an appointment to come back next week. Pts BP low in triage (60-70s systolic) pt denies dizziness or feeling faint. Pt just had a pacemaker placed about a month ago.

## 2023-10-25 NOTE — Discharge Instructions (Addendum)
 Before taking blood pressure medicine make sure you check your blood pressure tomorrow.  Make sure you are eating and drinking.  You can continue to take the medication you are taking for pain but if you need some additional pain control you can take half a tablet of the morphine .  Make sure you are taking something to keep your stool soft and not get constipated.  If you start having fever, vomiting, abdominal pain, passing out, chest pain, shortness of breath or decreased sensation or inability to move your leg return to the emergency room.

## 2023-10-29 ENCOUNTER — Ambulatory Visit (INDEPENDENT_AMBULATORY_CARE_PROVIDER_SITE_OTHER)

## 2023-10-29 DIAGNOSIS — I442 Atrioventricular block, complete: Secondary | ICD-10-CM

## 2023-10-29 NOTE — Progress Notes (Signed)
 Histology and Location of Pain: Chronic low back pain, unspecified back pain laterality, unspecified whether sciatica present  Sites of Visceral and Bony Disease: Lower back & bilateral Knees  Location(s) of Symptomatic: Lower back pain  Pain on a scale of 0-10 is:   Feet 8/10 & lower back 10/10 has a pinch nerve is going to have steroid injection today.   If Spine Met(s), symptoms, if any, include: Bowel/Bladder retention or incontinence (please describe): Occ. constipation and some urinary incontinence wears Depends. Numbness or weakness in extremities (please describe):  Numbness in left hand and both feet. Current Decadron  regimen, if applicable: No  Ambulatory status? Walker? Wheelchair?: independent  SAFETY ISSUES: Prior radiation? No Pacemaker/ICD? Pacemaker  Possible current pregnancy? Postmenopausal Is the patient on methotrexate? No  Current Complaints / other details:  None   30 minutes spent total, including time for meaningful use questions, reviewing medication, as well as spent in face-to-face time in nurse evaluation with the patient.

## 2023-10-30 LAB — CUP PACEART REMOTE DEVICE CHECK
Battery Remaining Longevity: 98 mo
Battery Remaining Percentage: 95.5 %
Battery Voltage: 3.02 V
Brady Statistic AP VP Percent: 1 %
Brady Statistic AP VS Percent: 1 %
Brady Statistic AS VP Percent: 99 %
Brady Statistic AS VS Percent: 1 %
Brady Statistic RA Percent Paced: 1 %
Brady Statistic RV Percent Paced: 99 %
Date Time Interrogation Session: 20250828020012
Implantable Lead Connection Status: 753985
Implantable Lead Connection Status: 753985
Implantable Lead Implant Date: 20250716
Implantable Lead Implant Date: 20250716
Implantable Lead Location: 753859
Implantable Lead Location: 753860
Implantable Pulse Generator Implant Date: 20250716
Lead Channel Impedance Value: 400 Ohm
Lead Channel Impedance Value: 590 Ohm
Lead Channel Pacing Threshold Amplitude: 0.75 V
Lead Channel Pacing Threshold Amplitude: 0.75 V
Lead Channel Pacing Threshold Pulse Width: 0.5 ms
Lead Channel Pacing Threshold Pulse Width: 0.5 ms
Lead Channel Sensing Intrinsic Amplitude: 12 mV
Lead Channel Sensing Intrinsic Amplitude: 5 mV
Lead Channel Setting Pacing Amplitude: 1 V
Lead Channel Setting Pacing Amplitude: 3.5 V
Lead Channel Setting Pacing Pulse Width: 0.5 ms
Lead Channel Setting Sensing Sensitivity: 2 mV
Pulse Gen Model: 2272
Pulse Gen Serial Number: 8285101

## 2023-11-03 ENCOUNTER — Encounter: Payer: Self-pay | Admitting: Radiation Oncology

## 2023-11-03 ENCOUNTER — Ambulatory Visit
Admission: RE | Admit: 2023-11-03 | Discharge: 2023-11-03 | Disposition: A | Discharge status: Home / Self Care | Source: Ambulatory Visit | Attending: Radiation Oncology | Admitting: Radiation Oncology

## 2023-11-03 VITALS — BP 143/70 | HR 102 | Temp 97.9°F | Resp 20 | Ht 62.0 in | Wt 147.2 lb

## 2023-11-03 DIAGNOSIS — R011 Cardiac murmur, unspecified: Secondary | ICD-10-CM | POA: Insufficient documentation

## 2023-11-03 DIAGNOSIS — Z79899 Other long term (current) drug therapy: Secondary | ICD-10-CM | POA: Insufficient documentation

## 2023-11-03 DIAGNOSIS — I10 Essential (primary) hypertension: Secondary | ICD-10-CM | POA: Diagnosis not present

## 2023-11-03 DIAGNOSIS — M19071 Primary osteoarthritis, right ankle and foot: Secondary | ICD-10-CM | POA: Diagnosis not present

## 2023-11-03 DIAGNOSIS — M545 Low back pain, unspecified: Secondary | ICD-10-CM | POA: Insufficient documentation

## 2023-11-03 DIAGNOSIS — M19072 Primary osteoarthritis, left ankle and foot: Secondary | ICD-10-CM | POA: Diagnosis not present

## 2023-11-03 DIAGNOSIS — Z7982 Long term (current) use of aspirin: Secondary | ICD-10-CM | POA: Insufficient documentation

## 2023-11-03 DIAGNOSIS — Z7983 Long term (current) use of bisphosphonates: Secondary | ICD-10-CM | POA: Insufficient documentation

## 2023-11-03 DIAGNOSIS — G8929 Other chronic pain: Secondary | ICD-10-CM | POA: Diagnosis not present

## 2023-11-03 DIAGNOSIS — M8580 Other specified disorders of bone density and structure, unspecified site: Secondary | ICD-10-CM | POA: Insufficient documentation

## 2023-11-03 DIAGNOSIS — Z950 Presence of cardiac pacemaker: Secondary | ICD-10-CM | POA: Diagnosis not present

## 2023-11-03 DIAGNOSIS — E785 Hyperlipidemia, unspecified: Secondary | ICD-10-CM | POA: Diagnosis not present

## 2023-11-03 DIAGNOSIS — Z791 Long term (current) use of non-steroidal anti-inflammatories (NSAID): Secondary | ICD-10-CM | POA: Diagnosis not present

## 2023-11-03 DIAGNOSIS — Z800 Family history of malignant neoplasm of digestive organs: Secondary | ICD-10-CM | POA: Insufficient documentation

## 2023-11-03 DIAGNOSIS — M5416 Radiculopathy, lumbar region: Secondary | ICD-10-CM | POA: Diagnosis not present

## 2023-11-03 NOTE — Progress Notes (Signed)
 Radiation Oncology         [phone] ________________________________  Initial Outpatient Consultation  Name: Michele  Michele Williamson MRN: [id number]  Date: 11/03/2023  DOB: [birth year]  CC:O'Sullivan, Eleanor, NP  Daryl Eleanor, NP   REFERRING PHYSICIAN: Daryl Eleanor, NP  DIAGNOSIS: 83 y.o. woman with severe painful osteoarthritis involving both feet.    ICD-10-CM   1. Chronic low back pain, unspecified back pain laterality, unspecified whether sciatica present  M54.50    G89.29     2. Other chronic pain  G89.29       HISTORY OF PRESENT ILLNESS: Michele  EKNOOR Williamson is a 83 y.o. woman self-referred at the suggestion of her son, radiation oncologist, Dr. Franky Riis, for evaluation of bilateral foot pain due to osteoarthritis. She reports that pain has been present for several years and has progressively worsened. The pain is localized across the midfoot and forefoot, particularly at the balls of her feet and toes, and is described as a constant squeezing or crushing sensation. She notes associated numbness in the toes but emphasizes that pain is her dominant and most debilitating symptom.  Her discomfort is severe enough that she requires pain medication throughout the day, though she is unable to recall the specific names. Conservative measures including orthotics and topical agents such as Voltaren  gel have provided minimal benefit. She has difficulty walking and performing daily activities, with foot pain significantly limiting her mobility and quality of life. She also reports R>L knee pain that is progressively worsening but not as severe as the foot pain at present.  She has previously undergone surgical procedures on her right foot over a decade ago, with limited long-term relief. Planned surgery on the left foot was postponed after recent cardiac issues requiring pacemaker placement. Given her age and comorbidities, she is hesitant to pursue further surgery.  Recent imaging  demonstrated osteoarthritic changes of the midfoot and forefoot. She is now being considered for low-dose radiation therapy as a potential modality to reduce pain and improve function.   PREVIOUS RADIATION THERAPY: No  PAST MEDICAL HISTORY:  Past Medical History:  Diagnosis Date   Arthritis    shoulder, hands , feet, knees,   Complication of anesthesia    Heart murmur    Hyperglycemia    Hyperlipemia    Hypertension    Osteopenia    PONV (postoperative nausea and vomiting)       PAST SURGICAL HISTORY: Past Surgical History:  Procedure Laterality Date   BUNIONECTOMY Right    CARPAL TUNNEL RELEASE     CATARACT EXTRACTION     CESAREAN SECTION     CHOLECYSTECTOMY  2001   PACEMAKER IMPLANT N/A 09/16/2023   Procedure: PACEMAKER IMPLANT;  Surgeon: Nancey Eulas BRAVO, MD;  Location: [facility];  Service: Cardiovascular;  Laterality: N/A;   REVERSE SHOULDER ARTHROPLASTY Left 06/28/2020   Procedure: REVERSE SHOULDER ARTHROPLASTY;  Surgeon: Melita Franky, MD;  Location: [facility];  Service: Orthopedics;  Laterality: Left;    ROTATOR CUFF REPAIR Right     FAMILY HISTORY:  Family History  Problem Relation Age of Onset   Hypertension Mother    Heart disease Mother    Bone cancer Father    Hypertension Father    Hyperlipidemia Father    Melanoma Brother    Stomach cancer Maternal Grandmother    Obesity Son     SOCIAL HISTORY:  Social History   Socioeconomic History   Marital status: Widowed    Spouse name: Not on  file   Number of children: Not on file   Years of education: Not on file   Highest education level: 12th grade  Occupational History   Not on file  Tobacco Use   Smoking status: Never   Smokeless tobacco: Never  Vaping Use   Vaping status: Never Used  Substance and Sexual Activity   Alcohol use: No   Drug use: No   Sexual activity: Not Currently    Birth control/protection: None  Other Topics Concern   Not on file  Social History Narrative    Lives in [place]   Has an adopted son born 2008-12-02   Husband died in December 03, 2006   Retired, worked as an Insurance underwriter   Enjoys senior center   Has a Veterinary surgeon   Completed HS   Social Drivers of Corporate investment banker Strain: Low Risk  (08/04/2022)   Overall Financial Resource Strain (CARDIA)    Difficulty of Paying Living Expenses: Not hard at all  Food Insecurity: No Food Insecurity (11/03/2023)   Hunger Vital Sign    Worried About Running Out of Food in the Last Year: Never true    Ran Out of Food in the Last Year: Never true  Transportation Needs: No Transportation Needs (11/03/2023)   PRAPARE - Administrator, Civil Service (Medical): No    Lack of Transportation (Non-Medical): No  Physical Activity: Unknown (08/04/2022)   Exercise Vital Sign    Days of Exercise per Week: 0 days    Minutes of Exercise per Session: Not on file  Stress: Stress Concern Present (08/04/2022)   [employer] of Occupational Health - Occupational Stress Questionnaire    Feeling of Stress : Rather much  Social Connections: Moderately Integrated (09/16/2023)   Social Connection and Isolation Panel    Frequency of Communication with Friends and Family: More than three times a week    Frequency of Social Gatherings with Friends and Family: More than three times a week    Attends Religious Services: More than 4 times per year    Active Member of [employer] or Organizations: Yes    Attends Banker Meetings: More than 4 times per year    Marital Status: Widowed  Intimate Partner Violence: Not At Risk (11/03/2023)   Humiliation, Afraid, Rape, and Kick questionnaire    Fear of Current or Ex-Partner: No    Emotionally Abused: No    Physically Abused: No    Sexually Abused: No    ALLERGIES: Cat dander, Oxycodone -acetaminophen , Penicillins, Pregabalin, and Tramadol  MEDICATIONS:  Current Outpatient Medications  Medication Sig Dispense Refill   TWINRIX 720-20 ELU-MCG/ML injection       alendronate  (FOSAMAX ) 70 MG tablet Take 1 tablet (70 mg total) by mouth every 7 (seven) days. Take with a full glass of water on an empty stomach. 12 tablet 4   aspirin  EC 81 MG tablet Take 1 tablet (81 mg total) by mouth daily. Swallow whole. (Patient taking differently: Take 81 mg by mouth daily as needed. Swallow whole.) 90 tablet 3   CALCIUM  CARBONATE-VITAMIN D  PO Take 1 tablet by mouth daily. 600-400 mg     diclofenac  Sodium (VOLTAREN ) 1 % GEL Apply 2 g topically 4 (four) times daily as needed. (Patient taking differently: Apply 2 g topically 4 (four) times daily as needed (for pain).) 100 g 0   furosemide  (LASIX ) 80 MG tablet Take 1 tablet (80 mg total) by mouth daily. 90 tablet 1   gabapentin  (NEURONTIN )  100 MG capsule Take 1 capsule (100 mg total) by mouth 3 (three) times daily. 180 capsule 0   Iron , Ferrous Sulfate , 325 (65 Fe) MG TABS Take 325 mg by mouth every other day. 30 tablet    lisinopril  (ZESTRIL ) 10 MG tablet Take 1 tablet (10 mg total) by mouth daily. Hold if blood pressure is <110. 90 tablet 1   loratadine (CLARITIN) 10 MG tablet Take 10 mg by mouth daily. [employer]     meloxicam  (MOBIC ) 7.5 MG tablet Take 1 tablet (7.5 mg total) by mouth daily. 90 tablet 3   morphine  (MSIR) 15 MG tablet Take 0.5 tablets (7.5 mg total) by mouth every 6 (six) hours as needed for severe pain (pain score 7-10). 15 tablet 0   Multiple Vitamins-Minerals (MULTIVITAMIN WITH MINERALS) tablet Take 1 tablet by mouth daily.     nortriptyline (PAMELOR) 25 MG capsule Take 25 mg by mouth at bedtime.     omeprazole  (PRILOSEC) 20 MG capsule Take 1 capsule (20 mg total) by mouth daily. (Patient not taking: Reported on 10/25/2023) 90 capsule 1   rosuvastatin  (CRESTOR ) 20 MG tablet Take 1 tablet (20 mg total) by mouth at bedtime. (Patient taking differently: Take 20 mg by mouth daily.) 90 tablet 1   No current facility-administered medications for this encounter.    REVIEW OF SYSTEMS:  On review of systems,  the patient reports Review of Systems Constitutional: Denies fevers, chills, or unintentional weight loss. Cardiac: History of arrhythmia, recently treated with pacemaker placement; currently stable. Pulmonary: No shortness of breath or cough. GI/GU: No abdominal pain, nausea, vomiting, or urinary complaints. Musculoskeletal: Severe chronic bilateral foot pain, stiffness, and functional limitation due to osteoarthritis. Additionally, she has R>L knee pain, with known OA, that is progressively worsening but not as severe as the foot pain at present. Neurologic: Numbness in toes, no weakness. Denies dizziness or headaches. Skin: No rashes or ulcerations on the feet. Other: Denies recent infections.   PHYSICAL EXAM:  Wt Readings from Last 3 Encounters:  11/03/23 147 lb 3.2 oz (66.8 kg)  09/30/23 155 lb (70.3 kg)  09/28/23 160 lb 9.6 oz (72.8 kg)   Temp Readings from Last 3 Encounters:  11/03/23 97.9 F (36.6 C) (Oral)  10/25/23 (!) 97.4 F (36.3 C)  09/30/23 98.7 F (37.1 C) (Oral)   BP Readings from Last 3 Encounters:  11/03/23 (!) 143/70  10/25/23 (!) 121/56  09/30/23 125/67   Pulse Readings from Last 3 Encounters:  11/03/23 (!) 102  10/25/23 86  09/30/23 (!) 103   Pain Assessment Pain Score: 8  Pain Loc: Foot (also in lower back 10 of 10.)/10  General: Elderly woman in no acute distress, alert and conversant. Cardiac: Regular rhythm, pacemaker in place. Pulmonary: Breathing comfortably on room air. Musculoskeletal: Bilateral feet with diffuse tenderness across the metatarsal heads and midfoot. Range of motion of the toes is limited by pain. Gait is antalgic. No open lesions. Neurologic: Sensation diminished in toes. Strength grossly intact. Extremities: Warm and well-perfused; capillary refill intact. No edema.  KPS = 90  100 - Normal; no complaints; no evidence of disease. 90   - Able to carry on normal activity; minor signs or symptoms of disease. 80   - Normal  activity with effort; some signs or symptoms of disease. 4   - Cares for self; unable to carry on normal activity or to do active work. 60   - Requires occasional assistance, but is able to care for most of  his personal needs. 50   - Requires considerable assistance and frequent medical care. 40   - Disabled; requires special care and assistance. 30   - Severely disabled; hospital admission is indicated although death not imminent. 20   - Very sick; hospital admission necessary; active supportive treatment necessary. 10   - Moribund; fatal processes progressing rapidly. 0     - Dead  Karnofsky DA, Abelmann WH, Craver LS and Burchenal [facility] [phone]) The use of the nitrogen mustards in the palliative treatment of carcinoma: with particular reference to bronchogenic carcinoma Cancer 1 634-56  LABORATORY DATA:  Lab Results  Component Value Date   WBC 8.7 10/25/2023   HGB 12.9 10/25/2023   HCT 40.5 10/25/2023   MCV 95.3 10/25/2023   PLT 265 10/25/2023   Lab Results  Component Value Date   NA 134 (L) 10/25/2023   K 4.2 10/25/2023   CL 105 10/25/2023   CO2 21 (L) 10/25/2023   Lab Results  Component Value Date   ALT 13 10/25/2023   AST 24 10/25/2023   ALKPHOS 49 10/25/2023   BILITOT 0.5 10/25/2023     RADIOGRAPHY: CUP PACEART REMOTE DEVICE CHECK Result Date: 10/30/2023 PPM Scheduled remote reviewed. Normal device function.  Presenting rhythm: AS-VP. 5 AHR detections, longest 10 seconds. Next remote 91 days. - CS, CVRS     IMPRESSION/PLAN: 9. 83 year old female with chronic, debilitating bilateral foot pain due to osteoarthritis. Conservative measures and prior surgical interventions have not provided lasting relief. She remains functionally limited by persistent pain and is not a surgical candidate due to age and recent cardiac comorbidity.  After a detailed discussion of the patient's history, prior treatment response, and current goals, we reviewed the proposed protocol for LDRT  targeting bilateral feet. Our institutional approach follows a regimen of 3 Gy total, delivered in 6 fractions of 0.5 Gy on a Monday-Wednesday-Friday schedule over two weeks. This dosing is consistent with published guidelines and peer-reviewed data for non-malignant musculoskeletal conditions.  We reviewed:   Goals of care: symptom relief and improved mobility Expected timeline for therapeutic benefit: typically several weeks to months Potential risks, including rare but theoretical concerns regarding late tissue effects or secondary malignancy (risk estimated to be extremely low at this dose and age) Lack of immunosuppression and inactive autoimmune history, minimizing concern for immune-mediated complications   My goal with low dose radiation therapy is to help reduce chronic joint pain and improve her mobility, particularly for activities like walking and standing. This treatment does not reverse joint damage, but it can calm the inflammation in and around the joint that contributes to pain. Based on published data, about 60-80% of patients with osteoarthritis experience meaningful pain relief within a few weeks to months after completing a short course of low-dose radiation. While not everyone responds, the majority of patients do report improvement, and the treatment is generally well tolerated.  The patient, Michele  Williamson, was agreeable to proceed. We will arrange CT simulation for planning and initiate treatment pending final review and setup. A follow-up visit will be scheduled approximately 8-12 weeks after completion of LDRT to assess clinical response.  If she has a good response to treatment, as expected, we will consider proceeding with LDRT to bilateral knees at that time.  She has freely signed written consent to proceed today in the office and a copy of this document will be placed in her medical record.  She is tentatively scheduled for CT simulation/treatment planning at 10 AM on  Wednesday, 11/11/2023, in anticipation of beginning her treatments on Monday, 11/16/2023.  We enjoyed meeting her today and look forward to continuing to participate in her care.   We personally spent 60 minutes in this encounter including chart review, reviewing radiological studies, meeting face-to-face with the patient, entering orders and completing documentation.   Michele MICAEL Rusk, PA-C    [name], MD  [facility]  Radiation Oncology Direct Dial: [phone]  Fax: [phone] [email or web address]  Skype  LinkedIn  Reference: Molinda HERO, et al. Randomized, double-blind, placebo-controlled trial on the effect of radiotherapy for painful heel spur (plantar fasciitis). Int JINNY Shock Oncol Biol Phys. 2012;84(5):e667-e672. [email or web address]

## 2023-11-04 ENCOUNTER — Encounter: Payer: Self-pay | Admitting: Cardiovascular Disease

## 2023-11-04 NOTE — Progress Notes (Signed)
 TO BE COMPLETED BY RADIATION ONCOLOGIST OFFICE:   Patient Name: Michele  GABRIANA Williamson   Date of Birth: 24-Jun-1940   Radiation Oncologist: Dr. [name]  Site to be Treated: Lumbar Spine   Will x-rays >10 MV be used? No  Will the radiation be >10 cm from the device? Yes  Planned Treatment Start Date: 2 weeks  TO BE COMPLETED BY CARDIOLOGIST OFFICE:   Device Information:  Pacemaker [x]      ICD []    Brand: St. Jude/Abbott: [phone] Model #: St. Jude Medical 2272 Assurity MRI  Serial Number: [id number]      Date of Placement: 09/16/2023  Site of Placement: Chest  Remote Device Check--Frequency: Every 91 days  Last Check: 10/29/2023  Is the Patient Pacer Dependent?:  Yes [x]   No []   Does cardiologist request Radiation Oncology to schedule device testing by vendor for the following:  Prior to the Initiation of Treatments?  Yes []  No [x]  During Treatments?  Yes []  No [x]  Post Radiation Treatments?  Yes []  No [x]   Is device monitoring necessary by vendor/cardiologist team during treatments?  Yes []   No [x]   Is cardiac monitoring by Radiation Oncology nursing necessary during treatments? Yes []   No [x]   Do you recommend device be relocated prior to Radiation Treatment? Yes []   No [x]   **PLEASE LIST ANY NOTES OR SPECIAL REQUESTS:       CARDIOLOGIST SIGNATURE:  Dr. Eulas Furbish Per Device Clinic Standing Orders, Almarie ONEIDA Shutter  11/04/2023 4:19 PM  **Please route completed form back to Radiation Oncology Nursing and P [facility] RAD ONC ADMIN, OR send an update if there will be a delay in having form completed by expected start date.  **Call [phone] if you have any questions or do not get an in-basket response from a Radiation Oncology staff member

## 2023-11-05 ENCOUNTER — Ambulatory Visit: Payer: Self-pay | Admitting: Cardiovascular Disease

## 2023-11-09 ENCOUNTER — Telehealth: Payer: Self-pay | Admitting: Family

## 2023-11-09 NOTE — Telephone Encounter (Signed)
 Copied from CRM [id number]. Topic: Medicare AWV >> Nov 09, 2023 11:19 AM Nathanel DEL wrote: Reason for CRM: Called LVM 11/09/2023 to schedule AWV. Please schedule Virtual or Telehealth visits ONLY.   Nathanel Paschal; Care Guide Ambulatory Clinical Support [place] [facility] Direct Dial: [phone]

## 2023-11-11 ENCOUNTER — Ambulatory Visit
Admission: RE | Admit: 2023-11-11 | Discharge: 2023-11-11 | Disposition: A | Discharge status: Home / Self Care | Source: Ambulatory Visit | Attending: Radiation Oncology | Admitting: Radiation Oncology

## 2023-11-11 DIAGNOSIS — M19071 Primary osteoarthritis, right ankle and foot: Secondary | ICD-10-CM | POA: Diagnosis not present

## 2023-11-11 DIAGNOSIS — Z510 Encounter for antineoplastic radiation therapy: Secondary | ICD-10-CM | POA: Diagnosis not present

## 2023-11-11 DIAGNOSIS — M19072 Primary osteoarthritis, left ankle and foot: Secondary | ICD-10-CM | POA: Diagnosis not present

## 2023-11-11 DIAGNOSIS — G8929 Other chronic pain: Secondary | ICD-10-CM | POA: Diagnosis not present

## 2023-11-11 DIAGNOSIS — M545 Low back pain, unspecified: Secondary | ICD-10-CM | POA: Diagnosis not present

## 2023-11-14 NOTE — Progress Notes (Signed)
  Radiation Oncology         [phone] ________________________________  Name: Michele Williamson  LAYTON TAPPAN MRN: [id number]  Date: 11/11/2023  DOB: 1940-11-10  SIMULATION AND TREATMENT PLANNING NOTE    ICD-10-CM   1. Primary osteoarthritis of both feet  M19.071    M19.072       DIAGNOSIS:   83 y.o. woman with severe painful osteoarthritis involving both feet.  NARRATIVE:  The patient was brought to the CT Simulation planning suite.  Identity was confirmed.  All relevant records and images related to the planned course of therapy were reviewed.  The patient freely provided informed written consent to proceed with treatment after reviewing the details related to the planned course of therapy. The consent form was witnessed and verified by the simulation staff.  Then, the patient was set-up in a stable reproducible  supine position for radiation therapy.  CT images were obtained.  Surface markings were placed.  The CT images were loaded into the planning software.  Then the target and avoidance structures were contoured.  Treatment planning then occurred.  The radiation prescription was entered and confirmed.  Then, I designed and supervised the construction of a total of multiple medically necessary complex treatment devices.  I have requested : Isodose Plan.    PLAN:  The patient will receive 3 Gy in 5 fractions to both feet.  ________________________________  [name]. [name], M.D.

## 2023-11-22 ENCOUNTER — Encounter: Payer: Self-pay | Admitting: Family

## 2023-11-22 DIAGNOSIS — I10 Essential (primary) hypertension: Secondary | ICD-10-CM

## 2023-11-22 MED ORDER — LISINOPRIL 10 MG PO TABS: 10.0000 mg | ORAL_TABLET | Freq: Every day | ORAL | 1 refills | Status: DC

## 2023-11-26 DIAGNOSIS — Z510 Encounter for antineoplastic radiation therapy: Secondary | ICD-10-CM | POA: Diagnosis not present

## 2023-11-30 ENCOUNTER — Ambulatory Visit
Admission: RE | Admit: 2023-11-30 | Discharge: 2023-11-30 | Disposition: A | Source: Ambulatory Visit | Attending: Radiation Oncology

## 2023-11-30 ENCOUNTER — Other Ambulatory Visit: Payer: Self-pay

## 2023-11-30 DIAGNOSIS — Z510 Encounter for antineoplastic radiation therapy: Secondary | ICD-10-CM | POA: Diagnosis not present

## 2023-11-30 DIAGNOSIS — M545 Low back pain, unspecified: Secondary | ICD-10-CM | POA: Diagnosis not present

## 2023-11-30 DIAGNOSIS — M19071 Primary osteoarthritis, right ankle and foot: Secondary | ICD-10-CM | POA: Diagnosis not present

## 2023-11-30 DIAGNOSIS — M19072 Primary osteoarthritis, left ankle and foot: Secondary | ICD-10-CM | POA: Diagnosis not present

## 2023-11-30 DIAGNOSIS — G8929 Other chronic pain: Secondary | ICD-10-CM | POA: Diagnosis not present

## 2023-11-30 LAB — RAD ONC ARIA SESSION SUMMARY
Course Elapsed Days: 0
Plan Fractions Treated to Date: 1
Plan Fractions Treated to Date: 1
Plan Prescribed Dose Per Fraction: 0.5 Gy
Plan Prescribed Dose Per Fraction: 0.5 Gy
Plan Total Fractions Prescribed: 6
Plan Total Fractions Prescribed: 6
Plan Total Prescribed Dose: 3 Gy
Plan Total Prescribed Dose: 3 Gy
Reference Point Dosage Given to Date: 0.5 Gy
Reference Point Dosage Given to Date: 0.5 Gy
Reference Point Session Dosage Given: 0.5 Gy
Reference Point Session Dosage Given: 0.5 Gy
Session Number: 1

## 2023-12-01 ENCOUNTER — Ambulatory Visit

## 2023-12-02 ENCOUNTER — Other Ambulatory Visit: Payer: Self-pay

## 2023-12-02 ENCOUNTER — Ambulatory Visit
Admission: RE | Admit: 2023-12-02 | Discharge: 2023-12-02 | Disposition: A | Discharge status: Home / Self Care | Source: Ambulatory Visit | Attending: Radiation Oncology | Admitting: Radiation Oncology

## 2023-12-02 DIAGNOSIS — M170 Bilateral primary osteoarthritis of knee: Secondary | ICD-10-CM | POA: Insufficient documentation

## 2023-12-02 LAB — RAD ONC ARIA SESSION SUMMARY
Course Elapsed Days: 2
Plan Fractions Treated to Date: 2
Plan Fractions Treated to Date: 2
Plan Prescribed Dose Per Fraction: 0.5 Gy
Plan Prescribed Dose Per Fraction: 0.5 Gy
Plan Total Fractions Prescribed: 6
Plan Total Fractions Prescribed: 6
Plan Total Prescribed Dose: 3 Gy
Plan Total Prescribed Dose: 3 Gy
Reference Point Dosage Given to Date: 1 Gy
Reference Point Dosage Given to Date: 1 Gy
Reference Point Session Dosage Given: 0.5 Gy
Reference Point Session Dosage Given: 0.5 Gy
Session Number: 2

## 2023-12-03 ENCOUNTER — Ambulatory Visit

## 2023-12-04 ENCOUNTER — Other Ambulatory Visit: Payer: Self-pay

## 2023-12-04 ENCOUNTER — Ambulatory Visit
Admission: RE | Admit: 2023-12-04 | Discharge: 2023-12-04 | Disposition: A | Discharge status: Home / Self Care | Source: Ambulatory Visit | Attending: Radiation Oncology | Admitting: Radiation Oncology

## 2023-12-04 DIAGNOSIS — M170 Bilateral primary osteoarthritis of knee: Secondary | ICD-10-CM | POA: Diagnosis not present

## 2023-12-04 LAB — RAD ONC ARIA SESSION SUMMARY
Course Elapsed Days: 4
Plan Fractions Treated to Date: 3
Plan Fractions Treated to Date: 3
Plan Prescribed Dose Per Fraction: 0.5 Gy
Plan Prescribed Dose Per Fraction: 0.5 Gy
Plan Total Fractions Prescribed: 6
Plan Total Fractions Prescribed: 6
Plan Total Prescribed Dose: 3 Gy
Plan Total Prescribed Dose: 3 Gy
Reference Point Dosage Given to Date: 1.5 Gy
Reference Point Dosage Given to Date: 1.5 Gy
Reference Point Session Dosage Given: 0.5 Gy
Reference Point Session Dosage Given: 0.5 Gy
Session Number: 3

## 2023-12-07 ENCOUNTER — Ambulatory Visit
Admission: RE | Admit: 2023-12-07 | Discharge: 2023-12-07 | Disposition: A | Discharge status: Home / Self Care | Source: Ambulatory Visit | Attending: Radiation Oncology | Admitting: Radiation Oncology

## 2023-12-07 ENCOUNTER — Other Ambulatory Visit: Payer: Self-pay

## 2023-12-07 DIAGNOSIS — M170 Bilateral primary osteoarthritis of knee: Secondary | ICD-10-CM | POA: Diagnosis not present

## 2023-12-07 LAB — RAD ONC ARIA SESSION SUMMARY
Course Elapsed Days: 7
Plan Fractions Treated to Date: 4
Plan Fractions Treated to Date: 4
Plan Prescribed Dose Per Fraction: 0.5 Gy
Plan Prescribed Dose Per Fraction: 0.5 Gy
Plan Total Fractions Prescribed: 6
Plan Total Fractions Prescribed: 6
Plan Total Prescribed Dose: 3 Gy
Plan Total Prescribed Dose: 3 Gy
Reference Point Dosage Given to Date: 2 Gy
Reference Point Dosage Given to Date: 2 Gy
Reference Point Session Dosage Given: 0.5 Gy
Reference Point Session Dosage Given: 0.5 Gy
Session Number: 4

## 2023-12-09 ENCOUNTER — Ambulatory Visit
Admission: RE | Admit: 2023-12-09 | Discharge: 2023-12-09 | Disposition: A | Source: Ambulatory Visit | Attending: Radiation Oncology

## 2023-12-09 ENCOUNTER — Other Ambulatory Visit: Payer: Self-pay

## 2023-12-09 ENCOUNTER — Ambulatory Visit

## 2023-12-09 DIAGNOSIS — Z510 Encounter for antineoplastic radiation therapy: Secondary | ICD-10-CM | POA: Diagnosis not present

## 2023-12-09 DIAGNOSIS — G8929 Other chronic pain: Secondary | ICD-10-CM | POA: Diagnosis not present

## 2023-12-09 DIAGNOSIS — M170 Bilateral primary osteoarthritis of knee: Secondary | ICD-10-CM | POA: Diagnosis not present

## 2023-12-09 DIAGNOSIS — M545 Low back pain, unspecified: Secondary | ICD-10-CM | POA: Diagnosis not present

## 2023-12-09 LAB — RAD ONC ARIA SESSION SUMMARY
Course Elapsed Days: 9
Plan Fractions Treated to Date: 5
Plan Fractions Treated to Date: 5
Plan Prescribed Dose Per Fraction: 0.5 Gy
Plan Prescribed Dose Per Fraction: 0.5 Gy
Plan Total Fractions Prescribed: 6
Plan Total Fractions Prescribed: 6
Plan Total Prescribed Dose: 3 Gy
Plan Total Prescribed Dose: 3 Gy
Reference Point Dosage Given to Date: 2.5 Gy
Reference Point Dosage Given to Date: 2.5 Gy
Reference Point Session Dosage Given: 0.5 Gy
Reference Point Session Dosage Given: 0.5 Gy
Session Number: 5

## 2023-12-11 ENCOUNTER — Ambulatory Visit
Admission: RE | Admit: 2023-12-11 | Discharge: 2023-12-11 | Disposition: A | Discharge status: Home / Self Care | Source: Ambulatory Visit | Attending: Radiation Oncology | Admitting: Radiation Oncology

## 2023-12-11 ENCOUNTER — Other Ambulatory Visit: Payer: Self-pay

## 2023-12-11 DIAGNOSIS — M170 Bilateral primary osteoarthritis of knee: Secondary | ICD-10-CM | POA: Diagnosis not present

## 2023-12-11 LAB — RAD ONC ARIA SESSION SUMMARY
Course Elapsed Days: 11
Plan Fractions Treated to Date: 6
Plan Fractions Treated to Date: 6
Plan Prescribed Dose Per Fraction: 0.5 Gy
Plan Prescribed Dose Per Fraction: 0.5 Gy
Plan Total Fractions Prescribed: 6
Plan Total Fractions Prescribed: 6
Plan Total Prescribed Dose: 3 Gy
Plan Total Prescribed Dose: 3 Gy
Reference Point Dosage Given to Date: 3 Gy
Reference Point Dosage Given to Date: 3 Gy
Reference Point Session Dosage Given: 0.5 Gy
Reference Point Session Dosage Given: 0.5 Gy
Session Number: 6

## 2023-12-16 DIAGNOSIS — M5416 Radiculopathy, lumbar region: Secondary | ICD-10-CM | POA: Diagnosis not present

## 2023-12-16 NOTE — Radiation Completion Notes (Addendum)
  Radiation Oncology         [phone] ________________________________  Name: Michele Williamson MRN: [id number]  Date: 12/11/2023  DOB: 06/07/1940   Referring Physician: ELEANOR PONTO, M.D. Date of Service: 2023-12-16 Radiation Oncologist: Adina Barge, M.D. [facility]      RADIATION ONCOLOGY END OF TREATMENT NOTE     Diagnosis:  83 y.o. woman with severe painful osteoarthritis involving both feet.   Intent: Curative     ==========DELIVERED PLANS==========  First Treatment Date: 2023-11-30 Last Treatment Date: 2023-12-11   Plan Name: Ext_L Site: Foot, Left Technique: Isodose Plan Mode: Photon Dose Per Fraction: 0.5 Gy Prescribed Dose (Delivered / Prescribed): 3 Gy / 3 Gy Prescribed Fxs (Delivered / Prescribed): 6 / 6   Plan Name: Ext_R Site: Foot, Right Technique: Isodose Plan Mode: Photon Dose Per Fraction: 0.5 Gy Prescribed Dose (Delivered / Prescribed): 3 Gy / 3 Gy Prescribed Fxs (Delivered / Prescribed): 6 / 6     ==========ON TREATMENT VISIT DATES========== 2023-12-11     See weekly On Treatment Notes in Epic for details in the Media tab (listed as Progress notes on the On Treatment Visit Dates listed above).  She tolerated the treatments relatively well with minimal fatigue.  The patient will receive a call in about one month from the radiation oncology department. She will continue follow up with ELEANOR PONTO, NP, as well.  ------------------------------------------------   [name], MD [facility]  Radiation Oncology Direct Dial: [phone]  Fax: [phone] [email or web address]  Skype  LinkedIn

## 2023-12-22 ENCOUNTER — Ambulatory Visit
Admission: RE | Admit: 2023-12-22 | Discharge: 2023-12-22 | Disposition: A | Discharge status: Home / Self Care | Source: Ambulatory Visit | Attending: Radiation Oncology | Admitting: Radiation Oncology

## 2023-12-22 DIAGNOSIS — M170 Bilateral primary osteoarthritis of knee: Secondary | ICD-10-CM

## 2023-12-22 NOTE — Progress Notes (Signed)
  Radiation Oncology         [phone] ________________________________  Name: Michele Williamson MRN: [id number]  Date: 12/22/2023  DOB: 01/12/1941  SIMULATION AND TREATMENT PLANNING NOTE    ICD-10-CM   1. Primary osteoarthritis of both knees  M17.0       DIAGNOSIS:  83 y.o. woman with severe painful osteoarthritis involving both knees  NARRATIVE:  The patient was brought to the CT Simulation planning suite.  Identity was confirmed.  All relevant records and images related to the planned course of therapy were reviewed.  The patient freely provided informed written consent to proceed with treatment after reviewing the details related to the planned course of therapy. The consent form was witnessed and verified by the simulation staff.  Then, the patient was set-up in a stable reproducible  prone position for radiation therapy.  CT images were obtained.  Surface markings were placed.  The CT images were loaded into the planning software.  Then the target and avoidance structures were contoured.  Treatment planning then occurred.  The radiation prescription was entered and confirmed.  Then, I designed and supervised the construction of a total of 4 medically necessary complex treatment devices including anterior and posterior beam MLCs shield the leg positioner.  I have requested : Isodose Plan.    PLAN:  The patient will receive 3 Gy in 6 fractions.  ________________________________  Donnice FELIX [name], M.D.

## 2023-12-23 ENCOUNTER — Ambulatory Visit: Attending: Cardiovascular Disease | Admitting: Cardiovascular Disease

## 2023-12-23 ENCOUNTER — Ambulatory Visit: Admitting: Cardiovascular Disease

## 2023-12-23 ENCOUNTER — Encounter: Payer: Self-pay | Admitting: Cardiovascular Disease

## 2023-12-23 VITALS — BP 152/78 | HR 97 | Ht 62.0 in | Wt 155.8 lb

## 2023-12-23 DIAGNOSIS — I442 Atrioventricular block, complete: Secondary | ICD-10-CM | POA: Diagnosis not present

## 2023-12-23 DIAGNOSIS — I2510 Atherosclerotic heart disease of native coronary artery without angina pectoris: Secondary | ICD-10-CM

## 2023-12-23 DIAGNOSIS — I5032 Chronic diastolic (congestive) heart failure: Secondary | ICD-10-CM | POA: Diagnosis not present

## 2023-12-23 LAB — CUP PACEART INCLINIC DEVICE CHECK
Battery Remaining Longevity: 115 mo
Battery Voltage: 3.01 V
Brady Statistic RA Percent Paced: 0.24 %
Brady Statistic RV Percent Paced: 99.87 %
Date Time Interrogation Session: 20251022120040
Implantable Lead Connection Status: 753985
Implantable Lead Connection Status: 753985
Implantable Lead Implant Date: 20250716
Implantable Lead Implant Date: 20250716
Implantable Lead Location: 753859
Implantable Lead Location: 753860
Implantable Pulse Generator Implant Date: 20250716
Lead Channel Impedance Value: 437.5 Ohm
Lead Channel Impedance Value: 575 Ohm
Lead Channel Pacing Threshold Amplitude: 0.75 V
Lead Channel Pacing Threshold Amplitude: 0.75 V
Lead Channel Pacing Threshold Amplitude: 0.75 V
Lead Channel Pacing Threshold Pulse Width: 0.5 ms
Lead Channel Pacing Threshold Pulse Width: 0.5 ms
Lead Channel Pacing Threshold Pulse Width: 0.5 ms
Lead Channel Sensing Intrinsic Amplitude: 5 mV
Lead Channel Setting Pacing Amplitude: 1 V
Lead Channel Setting Pacing Amplitude: 2.5 V
Lead Channel Setting Pacing Pulse Width: 0.5 ms
Lead Channel Setting Sensing Sensitivity: 4 mV
Pulse Gen Model: 2272
Pulse Gen Serial Number: 8285101

## 2023-12-23 NOTE — Patient Instructions (Signed)
 Medication Instructions:  Your physician recommends that you continue on your current medications as directed. Please refer to the Current Medication list given to you today.  *If you need a refill on your cardiac medications before your next appointment, please call your pharmacy*  Lab Work: None ordered.  If you have labs (blood work) drawn today and your tests are completely normal, you will receive your results only by: MyChart Message (if you have MyChart) OR A paper copy in the mail If you have any lab test that is abnormal or we need to change your treatment, we will call you to review the results.  Testing/Procedures: None ordered.   Follow-Up: At [facility], you and your health needs are our priority.  As part of our continuing mission to provide you with exceptional heart care, our providers are all part of one team.  This team includes your primary Cardiologist (physician) and Advanced Practice Providers or APPs (Physician Assistants and Nurse Practitioners) who all work together to provide you with the care you need, when you need it.  Your next appointment:   12 months with Dr Marko APP

## 2023-12-23 NOTE — Progress Notes (Signed)
  Electrophysiology Office Note:    Date:  12/23/2023   ID:  Michele Williamson, Michele Williamson 10-07-1940, MRN [id number]  PCP:  Daryl Setter, NP   [facility] Providers Cardiologist:  None     Referring MD: Daryl Setter, NP   History of Present Illness:    Michele  [name] is a 83 y.o. female with a medical history significant for complete heart block, prior left bundle branch block, nonobstructive coronary disease, hypertension, who presents for device follow-up.     History of Present Illness   He was admitted in July 2025 with near syncope, shortness of breath, chest pressure and found to be in complete heart block.  Prior EKGs showed a left bundle branch block.  She underwent placement of an Abbott dual-chamber pacemaker.      Today, she reports that she feels fine and has no complaints  EKGs/Labs/Other Studies Reviewed Today:     Echocardiogram:  TTE September 16, 2023 LVEF 55 to 60%.  Normal mitral and aortic valve structure and function.     EKG:   EKG Interpretation Date/Time:  Wednesday December 23 2023 10:57:37 EDT Ventricular Rate:  97 PR Interval:  170 QRS Duration:  140 QT Interval:  374 QTC Calculation: 474 R Axis:   73  Text Interpretation: Atrial-sensed ventricular-paced rhythm When compared with ECG of 25-Oct-2023 15:34, Vent. rate has increased BY  18 BPM Confirmed by Nancey Scotts [phone]) on 12/23/2023 11:05:42 AM     Physical Exam:    VS:  BP (!) 152/78   Pulse 97   Ht 5' 2 (1.575 m)   Wt 155 lb 12.8 oz (70.7 kg)   SpO2 95%   BMI 28.50 kg/m     Wt Readings from Last 3 Encounters:  12/23/23 155 lb 12.8 oz (70.7 kg)  11/03/23 147 lb 3.2 oz (66.8 kg)  09/30/23 155 lb (70.3 kg)     GEN: Well nourished, well developed in no acute distress CARDIAC: RRR, no murmurs, rubs, gallops The device site is normal -- no tenderness, edema, drainage, redness, threatened erosion.  RESPIRATORY:  Normal work of breathing MUSCULOSKELETAL:  no edema    ASSESSMENT & PLAN:     Complete heart block Abbott dual-chamber pacemaker in place, functioning normally I reviewed today's device interrogation.  See Paceart for details Normal function She is pacing-dependent today  Nonobstructive coronary disease No symptoms  We discussed eating a healthy diet Continue rosuvastatin  20 mg      Signed, Scotts FORBES Nancey, MD  12/23/2023 11:06 AM    [facility]

## 2023-12-29 NOTE — Progress Notes (Signed)
 Diagnosis:  83 y.o. woman with severe painful osteoarthritis involving both feet.   First Treatment Date: 2023-11-30 Last Treatment Date: 2023-12-11   Plan Name: Ext_L Site: Foot, Left Technique: Isodose Plan Mode: Photon Dose Per Fraction: 0.5 Gy Prescribed Dose (Delivered / Prescribed): 3 Gy / 3 Gy Prescribed Fxs (Delivered / Prescribed): 6 / 6   Plan Name: Ext_R Site: Foot, Right Technique: Isodose Plan Mode: Photon Dose Per Fraction: 0.5 Gy Prescribed Dose (Delivered / Prescribed): 3 Gy / 3 Gy Prescribed Fxs (Delivered / Prescribed): 6 / 6    Patient available for call, had no complaints at this time.  Reports radiation treatment helped with her feet and that she will be returning 01/13/2024 to have her knees radiated.  Reports she is happy with treatment and looks forward to starting.

## 2023-12-30 ENCOUNTER — Telehealth: Payer: Self-pay | Admitting: Family

## 2023-12-30 NOTE — Telephone Encounter (Signed)
 Copied from CRM [id number]. Topic: Medicare AWV >> Dec 30, 2023 10:16 AM Nathanel DEL wrote: Reason for CRM: LVM 12/30/2023 to r/s AWV due to [facility] out sick. Please call [phone] to r/s AWV khc  Nathanel Paschal; Care Guide Ambulatory Clinical Support [facility] Direct Dial: [phone]

## 2023-12-31 ENCOUNTER — Ambulatory Visit

## 2024-01-01 ENCOUNTER — Ambulatory Visit: Admitting: Family

## 2024-01-06 ENCOUNTER — Other Ambulatory Visit (HOSPITAL_COMMUNITY): Payer: Self-pay

## 2024-01-06 DIAGNOSIS — M170 Bilateral primary osteoarthritis of knee: Secondary | ICD-10-CM | POA: Insufficient documentation

## 2024-01-07 ENCOUNTER — Ambulatory Visit: Payer: Self-pay | Admitting: Cardiovascular Disease

## 2024-01-11 ENCOUNTER — Ambulatory Visit

## 2024-01-11 DIAGNOSIS — M5416 Radiculopathy, lumbar region: Secondary | ICD-10-CM | POA: Diagnosis not present

## 2024-01-11 DIAGNOSIS — M48062 Spinal stenosis, lumbar region with neurogenic claudication: Secondary | ICD-10-CM | POA: Diagnosis not present

## 2024-01-12 ENCOUNTER — Ambulatory Visit
Admission: RE | Admit: 2024-01-12 | Discharge: 2024-01-12 | Disposition: A | Discharge status: Home / Self Care | Source: Ambulatory Visit | Attending: Radiation Oncology | Admitting: Radiation Oncology

## 2024-01-12 DIAGNOSIS — M19071 Primary osteoarthritis, right ankle and foot: Secondary | ICD-10-CM

## 2024-01-13 ENCOUNTER — Other Ambulatory Visit: Payer: Self-pay

## 2024-01-13 ENCOUNTER — Ambulatory Visit
Admission: RE | Admit: 2024-01-13 | Discharge: 2024-01-13 | Disposition: A | Discharge status: Home / Self Care | Source: Ambulatory Visit | Attending: Radiation Oncology | Admitting: Radiation Oncology

## 2024-01-13 ENCOUNTER — Ambulatory Visit

## 2024-01-13 DIAGNOSIS — M170 Bilateral primary osteoarthritis of knee: Secondary | ICD-10-CM | POA: Diagnosis not present

## 2024-01-13 LAB — RAD ONC ARIA SESSION SUMMARY
Course Elapsed Days: 0
Plan Fractions Treated to Date: 1
Plan Prescribed Dose Per Fraction: 0.5 Gy
Plan Total Fractions Prescribed: 6
Plan Total Prescribed Dose: 3 Gy
Reference Point Dosage Given to Date: 0.5 Gy
Reference Point Session Dosage Given: 0.5 Gy
Session Number: 1

## 2024-01-14 ENCOUNTER — Other Ambulatory Visit: Payer: Self-pay | Admitting: Family

## 2024-01-15 ENCOUNTER — Ambulatory Visit: Admission: RE | Admit: 2024-01-15 | Source: Ambulatory Visit

## 2024-01-15 ENCOUNTER — Ambulatory Visit

## 2024-01-18 ENCOUNTER — Ambulatory Visit

## 2024-01-18 ENCOUNTER — Ambulatory Visit
Admission: RE | Admit: 2024-01-18 | Discharge: 2024-01-18 | Disposition: A | Discharge status: Home / Self Care | Source: Ambulatory Visit | Attending: Radiation Oncology | Admitting: Radiation Oncology

## 2024-01-18 ENCOUNTER — Other Ambulatory Visit: Payer: Self-pay

## 2024-01-18 DIAGNOSIS — M170 Bilateral primary osteoarthritis of knee: Secondary | ICD-10-CM | POA: Diagnosis not present

## 2024-01-18 LAB — RAD ONC ARIA SESSION SUMMARY
Course Elapsed Days: 5
Plan Fractions Treated to Date: 2
Plan Prescribed Dose Per Fraction: 0.5 Gy
Plan Total Fractions Prescribed: 6
Plan Total Prescribed Dose: 3 Gy
Reference Point Dosage Given to Date: 1 Gy
Reference Point Session Dosage Given: 0.5 Gy
Session Number: 2

## 2024-01-20 ENCOUNTER — Ambulatory Visit

## 2024-01-20 ENCOUNTER — Other Ambulatory Visit: Payer: Self-pay

## 2024-01-20 ENCOUNTER — Ambulatory Visit
Admission: RE | Admit: 2024-01-20 | Discharge: 2024-01-20 | Disposition: A | Source: Ambulatory Visit | Attending: Radiation Oncology

## 2024-01-20 DIAGNOSIS — M170 Bilateral primary osteoarthritis of knee: Secondary | ICD-10-CM | POA: Diagnosis not present

## 2024-01-20 LAB — RAD ONC ARIA SESSION SUMMARY
Course Elapsed Days: 7
Plan Fractions Treated to Date: 3
Plan Prescribed Dose Per Fraction: 0.5 Gy
Plan Total Fractions Prescribed: 6
Plan Total Prescribed Dose: 3 Gy
Reference Point Dosage Given to Date: 1.5 Gy
Reference Point Session Dosage Given: 0.5 Gy
Session Number: 3

## 2024-01-22 ENCOUNTER — Ambulatory Visit
Admission: RE | Admit: 2024-01-22 | Discharge: 2024-01-22 | Disposition: A | Discharge status: Home / Self Care | Source: Ambulatory Visit | Attending: Radiation Oncology | Admitting: Radiation Oncology

## 2024-01-22 ENCOUNTER — Ambulatory Visit

## 2024-01-22 ENCOUNTER — Other Ambulatory Visit: Payer: Self-pay

## 2024-01-22 DIAGNOSIS — M170 Bilateral primary osteoarthritis of knee: Secondary | ICD-10-CM | POA: Diagnosis not present

## 2024-01-22 LAB — RAD ONC ARIA SESSION SUMMARY
Course Elapsed Days: 9
Plan Fractions Treated to Date: 4
Plan Prescribed Dose Per Fraction: 0.5 Gy
Plan Total Fractions Prescribed: 6
Plan Total Prescribed Dose: 3 Gy
Reference Point Dosage Given to Date: 2 Gy
Reference Point Session Dosage Given: 0.5 Gy
Session Number: 4

## 2024-01-24 ENCOUNTER — Encounter: Payer: Self-pay | Admitting: Family

## 2024-01-24 DIAGNOSIS — G629 Polyneuropathy, unspecified: Secondary | ICD-10-CM

## 2024-01-24 DIAGNOSIS — I10 Essential (primary) hypertension: Secondary | ICD-10-CM

## 2024-01-25 ENCOUNTER — Ambulatory Visit

## 2024-01-25 ENCOUNTER — Ambulatory Visit
Admission: RE | Admit: 2024-01-25 | Discharge: 2024-01-25 | Disposition: A | Discharge status: Home / Self Care | Source: Ambulatory Visit | Attending: Radiation Oncology | Admitting: Radiation Oncology

## 2024-01-25 ENCOUNTER — Other Ambulatory Visit: Payer: Self-pay

## 2024-01-25 DIAGNOSIS — Z510 Encounter for antineoplastic radiation therapy: Secondary | ICD-10-CM | POA: Diagnosis not present

## 2024-01-25 DIAGNOSIS — M170 Bilateral primary osteoarthritis of knee: Secondary | ICD-10-CM | POA: Diagnosis not present

## 2024-01-25 LAB — RAD ONC ARIA SESSION SUMMARY
Course Elapsed Days: 12
Plan Fractions Treated to Date: 5
Plan Prescribed Dose Per Fraction: 0.5 Gy
Plan Total Fractions Prescribed: 6
Plan Total Prescribed Dose: 3 Gy
Reference Point Dosage Given to Date: 2.5 Gy
Reference Point Session Dosage Given: 0.5 Gy
Session Number: 5

## 2024-01-25 MED ORDER — GABAPENTIN 100 MG PO CAPS: 100.0000 mg | ORAL_CAPSULE | Freq: Three times a day (TID) | ORAL | 0 refills | Status: DC

## 2024-01-25 MED ORDER — LISINOPRIL 10 MG PO TABS: 10.0000 mg | ORAL_TABLET | Freq: Every day | ORAL | 0 refills | Status: DC

## 2024-01-27 ENCOUNTER — Ambulatory Visit
Admission: RE | Admit: 2024-01-27 | Discharge: 2024-01-27 | Disposition: A | Source: Ambulatory Visit | Attending: Radiation Oncology

## 2024-01-27 ENCOUNTER — Ambulatory Visit
Admission: RE | Admit: 2024-01-27 | Discharge: 2024-01-27 | Disposition: A | Discharge status: Home / Self Care | Source: Ambulatory Visit | Attending: Radiation Oncology | Admitting: Radiation Oncology

## 2024-01-27 ENCOUNTER — Other Ambulatory Visit: Payer: Self-pay

## 2024-01-27 DIAGNOSIS — M19071 Primary osteoarthritis, right ankle and foot: Secondary | ICD-10-CM

## 2024-01-27 DIAGNOSIS — M170 Bilateral primary osteoarthritis of knee: Secondary | ICD-10-CM

## 2024-01-27 LAB — RAD ONC ARIA SESSION SUMMARY
Course Elapsed Days: 14
Plan Fractions Treated to Date: 6
Plan Prescribed Dose Per Fraction: 0.5 Gy
Plan Total Fractions Prescribed: 6
Plan Total Prescribed Dose: 3 Gy
Reference Point Dosage Given to Date: 3 Gy
Reference Point Session Dosage Given: 0.5 Gy
Session Number: 6

## 2024-01-28 ENCOUNTER — Ambulatory Visit

## 2024-01-28 DIAGNOSIS — I2510 Atherosclerotic heart disease of native coronary artery without angina pectoris: Secondary | ICD-10-CM | POA: Diagnosis not present

## 2024-01-29 LAB — CUP PACEART REMOTE DEVICE CHECK
Battery Remaining Longevity: 112 mo
Battery Remaining Percentage: 95.5 %
Battery Voltage: 3.01 V
Brady Statistic AP VP Percent: 1 %
Brady Statistic AP VS Percent: 1 %
Brady Statistic AS VP Percent: 99 %
Brady Statistic AS VS Percent: 1 %
Brady Statistic RA Percent Paced: 1 %
Brady Statistic RV Percent Paced: 99 %
Date Time Interrogation Session: 20251127020037
Implantable Lead Connection Status: 753985
Implantable Lead Connection Status: 753985
Implantable Lead Implant Date: 20250716
Implantable Lead Implant Date: 20250716
Implantable Lead Location: 753859
Implantable Lead Location: 753860
Implantable Pulse Generator Implant Date: 20250716
Lead Channel Impedance Value: 410 Ohm
Lead Channel Impedance Value: 550 Ohm
Lead Channel Pacing Threshold Amplitude: 0.75 V
Lead Channel Pacing Threshold Amplitude: 0.75 V
Lead Channel Pacing Threshold Pulse Width: 0.5 ms
Lead Channel Pacing Threshold Pulse Width: 0.5 ms
Lead Channel Sensing Intrinsic Amplitude: 12 mV
Lead Channel Sensing Intrinsic Amplitude: 4.6 mV
Lead Channel Setting Pacing Amplitude: 1 V
Lead Channel Setting Pacing Amplitude: 2.5 V
Lead Channel Setting Pacing Pulse Width: 0.5 ms
Lead Channel Setting Sensing Sensitivity: 4 mV
Pulse Gen Model: 2272
Pulse Gen Serial Number: 8285101

## 2024-02-01 NOTE — Radiation Completion Notes (Addendum)
" °  Radiation Oncology         [phone] ________________________________  Name: Michele  NECOLA Williamson MRN: [id number]  Date: 01/27/2024  DOB: Nov 17, 1940  Referring Physician: ELEANOR PONTO, M.D. Date of Service: 2024-02-01 Radiation Oncologist: Adina Barge, M.D. [facility]     RADIATION ONCOLOGY END OF TREATMENT NOTE    83 y.o. woman with severe painful osteoarthritis involving both knees   Intent: Curative    ==========DELIVERED PLANS==========  First Treatment Date: 2024-01-13 Last Treatment Date: 2024-01-27   Plan Name: Ext_L_R Site: Knee, Left Technique: Isodose Plan Mode: Photon Dose Per Fraction: 0.5 Gy Prescribed Dose (Delivered / Prescribed): 3 Gy / 3 Gy Prescribed Fxs (Delivered / Prescribed): 6 / 6     ==========ON TREATMENT VISIT DATES========== 2024-01-27     See weekly On Treatment Notes in Epic for details in the Media tab (listed as Progress notes on the On Treatment Visit Dates listed above).  The patient will receive a call in about one month from the radiation oncology department. She will continue follow up with her PCP, Eleanor Ponto, NP, as well.  ------------------------------------------------   [name], MD [facility]  Radiation Oncology Direct Dial: [phone]  Fax: [phone] [email or web address]  Skype  LinkedIn    "

## 2024-02-02 ENCOUNTER — Ambulatory Visit: Payer: Self-pay | Admitting: Cardiovascular Disease

## 2024-02-02 NOTE — Progress Notes (Signed)
 Remote PPM Transmission

## 2024-02-03 ENCOUNTER — Other Ambulatory Visit: Payer: Self-pay | Admitting: Medical Genetics

## 2024-02-10 ENCOUNTER — Ambulatory Visit

## 2024-02-10 ENCOUNTER — Telehealth: Payer: Self-pay | Admitting: *Deleted

## 2024-02-10 VITALS — BP 119/72 | Ht 63.0 in | Wt 155.0 lb

## 2024-02-10 DIAGNOSIS — Z0000 Encounter for general adult medical examination without abnormal findings: Secondary | ICD-10-CM

## 2024-02-10 DIAGNOSIS — Z1231 Encounter for screening mammogram for malignant neoplasm of breast: Secondary | ICD-10-CM | POA: Diagnosis not present

## 2024-02-10 NOTE — Telephone Encounter (Signed)
 I am happy to see her sooner if she wishes to further discuss the pain- sometimes physical therapy of spinal injections can help.

## 2024-02-10 NOTE — Patient Instructions (Addendum)
 [name],  Thank you for taking the time for your Medicare Wellness Visit. I appreciate your continued commitment to your health goals. Please review the care plan we discussed, and feel free to reach out if I can assist you further.  Please note that Annual Wellness Visits do not include a physical exam. Some assessments may be limited, especially if the visit was conducted virtually. If needed, we may recommend an in-person follow-up with your provider.  Goal:  To drink at least 4 glasses of water daily  Ongoing Care Seeing your primary care provider every 3 to 6 months helps us  monitor your health and provide consistent, personalized care.   Eleanor Ponto, NP: 04/06/24 12:40pm Medicare AWV:  02/10/25 1:40pm, telephone  Referrals If a referral was made during today's visit and you haven't received any updates within two weeks, please contact the referred provider directly to check on the status.  Mammogram ([facility]) due after 06/22/24: [phone]  Recommended Screenings:    Health Maintenance  Topic Date Due   Medicare Annual Wellness Visit  03/20/2023   COVID-19 Vaccine (9 - Moderna risk 2025-26 season) 06/09/2024   DTaP/Tdap/Td vaccine (4 - Td or Tdap) 11/10/2030   Pneumococcal Vaccine for age over 12  Completed   Flu Shot  Completed   Osteoporosis screening with Bone Density Scan  Completed   Zoster (Shingles) Vaccine  Completed   Meningitis B Vaccine  Aged Out   Hepatitis B Vaccine  Discontinued       02/10/2024    1:47 PM  Advanced Directives  Does Patient Have a Medical Advance Directive? Yes  Type of Estate Agent of [place];Living will  Does patient want to make changes to medical advance directive? No - Patient declined  Copy of Healthcare Power of Attorney in Chart? No - copy requested  Would patient like information on creating a medical advance directive? No - Patient declined  Please bring a copy of your health care power of attorney  and living will to the office to be added to your chart at your convenience. You can mail a copy to [facility] [address] [place], [place] [address] or email to [email or web address]   Vision: Annual vision screenings are recommended for early detection of glaucoma, cataracts, and diabetic retinopathy. These exams can also reveal signs of chronic conditions such as diabetes and high blood pressure.  Dental: Annual dental screenings help detect early signs of oral cancer, gum disease, and other conditions linked to overall health, including heart disease and diabetes.  Please see the attached documents for additional preventive care recommendations.

## 2024-02-10 NOTE — Telephone Encounter (Signed)
 Called patient about this and she will like the appointment as scheduled

## 2024-02-10 NOTE — Telephone Encounter (Signed)
 Pt had AWV today.  Reports new problem since last seeing you of muscle spasms in lower back radiating down her leg. She is using a cane to keep from falling when the spasms occur. States her and her son have researched and found there is nothing to be done for this.    Pt wanted you to know that she underwent 6 radiation treatments each for her knees and feet from September through November and it has helped tremendously.   Pt has scheduled follow up with PCP for 04/06/24.

## 2024-02-10 NOTE — Progress Notes (Signed)
 Please attest this visit in the absence of patient primary care provider.   Chief Complaint  Patient presents with   Medicare Wellness     Subjective:   Michele  KILIE Williamson is a 83 y.o. female who presents for a Medicare Annual Wellness Visit.  Visit info / Clinical Intake: Medicare Wellness Visit Type:: Subsequent Annual Wellness Visit Persons participating in visit and providing information:: patient Medicare Wellness Visit Mode:: Telephone If telephone:: video declined Since this visit was completed virtually, some vitals may be partially provided or unavailable. Missing vitals are due to the limitations of the virtual format.: Unable to obtain vitals - no equipment If Telephone or Video please confirm:: I connected with patient using audio/video enable telemedicine. I verified patient identity with two identifiers, discussed telehealth limitations, and patient agreed to proceed. Patient Location:: home Provider Location:: office Interpreter Needed?: No Pre-visit prep was completed: yes AWV questionnaire completed by patient prior to visit?: no Living arrangements:: with family/others (daughter and grandson live with her) Patient's Overall Health Status Rating: excellent Typical amount of pain: some (currently has muscle spasms in lower back and down leg.) Does pain affect daily life?: (!) yes Are you currently prescribed opioids?: no  Dietary Habits and Nutritional Risks How many meals a day?: 2 Eats fruit and vegetables daily?: yes Most meals are obtained by: preparing own meals In the last 2 weeks, have you had any of the following?: none Diabetic:: no  Functional Status Activities of Daily Living (to include ambulation/medication): Independent Ambulation: Independent with device- listed below Home Assistive Devices/Equipment: [place] (just since developing back spasms) Medication Administration: Independent Home Management (perform basic housework or laundry):  Independent Manage your own finances?: yes Primary transportation is: driving Concerns about vision?: no *vision screening is required for WT[facility]* ([facility]) Concerns about hearing?: (!) yes (had hearing test 8 yrs ago and has bilateral hearing loss) Uses hearing aids?: no  Fall Screening Falls in the past year?: 0 Number of falls in past year: 0 Was there an injury with Fall?: 0 Fall Risk Category Calculator: 0 Patient Fall Risk Level: Low Fall Risk  Fall Risk Patient at Risk for Falls Due to: Impaired balance/gait; Orthopedic patient Fall risk Follow up: Falls evaluation completed  Home and Transportation Safety: All rugs have non-skid backing?: yes All stairs or steps have railings?: yes (2 steps up into home) Grab bars in the bathtub or shower?: yes Have non-skid surface in bathtub or shower?: yes Good home lighting?: yes Regular seat belt use?: yes Hospital stays in the last year:: (!) yes How many hospital stays:: 2 Reason: AV block / pacemaker placement, dehyration  Cognitive Assessment Difficulty concentrating, remembering, or making decisions? : no Will 6CIT or Mini Cog be Completed: yes What year is it?: 0 points What month is it?: 0 points Give patient an address phrase to remember (5 components): [address] [place]  About what time is it?: 0 points Count backwards from 20 to 1: 0 points Say the months of the year in reverse: 0 points Repeat the address phrase from earlier: 0 points 6 CIT Score: 0 points  Advance Directives (For Healthcare) Does Patient Have a Medical Advance Directive?: Yes Does patient want to make changes to medical advance directive?: No - Patient declined Type of Advance Directive: Healthcare Power of [place]; Living will Copy of Healthcare Power of Attorney in Chart?: No - copy requested Copy of Living Will in Chart?: No - copy requested Would patient like information on  creating a medical advance directive?: No -  Patient declined  Reviewed/Updated  Reviewed/Updated: Reviewed All (Medical, Surgical, Family, Medications, Allergies, Care Teams, Patient Goals)    Allergies (verified) Cat dander, Oxycodone -acetaminophen , Penicillins, Pregabalin, and Tramadol   Current Medications (verified) Outpatient Encounter Medications as of 02/10/2024  Medication Sig   alendronate  (FOSAMAX ) 70 MG tablet Take 1 tablet (70 mg total) by mouth every 7 (seven) days. Take with a full glass of water on an empty stomach.   aspirin  EC 81 MG tablet Take 1 tablet (81 mg total) by mouth daily. Swallow whole. (Patient taking differently: Take 81 mg by mouth daily as needed. Swallow whole.)   CALCIUM  CARBONATE-VITAMIN D  PO Take 1 tablet by mouth daily. 600-400 mg   diclofenac  Sodium (VOLTAREN ) 1 % GEL Apply 2 g topically 4 (four) times daily as needed.   furosemide  (LASIX ) 80 MG tablet Take 1 tablet (80 mg total) by mouth daily. (Patient taking differently: Take 80 mg by mouth daily. Takes 1-2 times weekly if swelling in feet/ankles)   gabapentin  (NEURONTIN ) 100 MG capsule Take 1 capsule (100 mg total) by mouth 3 (three) times daily.   Iron , Ferrous Sulfate , 325 (65 Fe) MG TABS Take 325 mg by mouth every other day.   lisinopril  (ZESTRIL ) 10 MG tablet Take 1 tablet (10 mg total) by mouth daily. Hold if blood pressure is <110.   loratadine (CLARITIN) 10 MG tablet Take 10 mg by mouth daily. [employer]   meloxicam  (MOBIC ) 7.5 MG tablet Take 1 tablet (7.5 mg total) by mouth daily.   Multiple Vitamins-Minerals (MULTIVITAMIN WITH MINERALS) tablet Take 1 tablet by mouth daily.   nortriptyline (PAMELOR) 25 MG capsule Take 25 mg by mouth at bedtime.   omeprazole  (PRILOSEC) 20 MG capsule Take 1 capsule (20 mg total) by mouth daily.   rosuvastatin  (CRESTOR ) 20 MG tablet Take 1 tablet (20 mg total) by mouth at bedtime.   TWINRIX 720-20 ELU-MCG/ML injection    [DISCONTINUED] morphine  (MSIR) 15 MG tablet Take 0.5 tablets (7.5 mg total) by  mouth every 6 (six) hours as needed for severe pain (pain score 7-10).   No facility-administered encounter medications on file as of 02/10/2024.    History: Past Medical History:  Diagnosis Date   Arthritis    shoulder, hands , feet, knees,   Complication of anesthesia    Heart murmur    Hyperglycemia    Hyperlipemia    Hypertension    Osteopenia    PONV (postoperative nausea and vomiting)    Past Surgical History:  Procedure Laterality Date   BUNIONECTOMY Right    CARPAL TUNNEL RELEASE     CATARACT EXTRACTION     CESAREAN SECTION     CHOLECYSTECTOMY  2001   PACEMAKER IMPLANT N/A 09/16/2023   Procedure: PACEMAKER IMPLANT;  Surgeon: Nancey Eulas BRAVO, MD;  Location: [facility] INVASIVE [facility];  Service: Cardiovascular;  Laterality: N/A;   REVERSE SHOULDER ARTHROPLASTY Left 06/28/2020   Procedure: REVERSE SHOULDER ARTHROPLASTY;  Surgeon: Melita Drivers, MD;  Location: [facility] OR[facility];  Service: Orthopedics;  Laterality: Left;    ROTATOR CUFF REPAIR Right    Family History  Problem Relation Age of Onset   Hypertension Mother    Heart disease Mother    Bone cancer Father    Hypertension Father    Hyperlipidemia Father    Melanoma Brother    Stomach cancer Maternal Grandmother    Obesity Son    Social History   Occupational History   Not  on file  Tobacco Use   Smoking status: Never   Smokeless tobacco: Never  Vaping Use   Vaping status: Never Used  Substance and Sexual Activity   Alcohol use: No   Drug use: No   Sexual activity: Not Currently    Birth control/protection: None   Tobacco Counseling Counseling given: Not Answered  SDOH Screenings   Food Insecurity: No Food Insecurity (02/10/2024)  Housing: Low Risk  (02/10/2024)  Transportation Needs: No Transportation Needs (02/10/2024)  Utilities: Not At Risk (02/10/2024)  Alcohol Screen: Low Risk  (03/19/2022)  Depression (PHQ2-9): Low Risk  (02/10/2024)  Financial Resource Strain: Low Risk (12/14/2023)   Received  from [facility]  Physical Activity: Inactive (02/10/2024)  Social Connections: Moderately Integrated (02/10/2024)  Stress: Stress Concern Present (02/10/2024)  Tobacco Use: Low Risk  (02/10/2024)   See flowsheets for full screening details  Depression Screen PHQ 2 & 9 Depression Scale- Over the past 2 weeks, how often have you been bothered by any of the following problems? Little interest or pleasure in doing things: 0 Feeling down, depressed, or hopeless (PHQ Adolescent also includes...irritable): 1 (a couple of friends have passed away recently) PHQ-2 Total Score: 1 Trouble falling or staying asleep, or sleeping too much: 0 Feeling tired or having little energy: 0 (takes an afternoon nap once a day) Poor appetite or overeating (PHQ Adolescent also includes...weight loss): 0 Feeling bad about yourself - or that you are a failure or have let yourself or your family down: 0 Trouble concentrating on things, such as reading the newspaper or watching television (PHQ Adolescent also includes...like school work): 0 Moving or speaking so slowly that other people could have noticed. Or the opposite - being so fidgety or restless that you have been moving around a lot more than usual: 0 Thoughts that you would be better off dead, or of hurting yourself in some way: 0 PHQ-9 Total Score: 1 If you checked off any problems, how difficult have these problems made it for you to do your work, take care of things at home, or get along with other people?: Somewhat difficult (Doesn't feel like she needs counseling at this time)  Depression Treatment Depression Interventions/Treatment : EYV7-0 Score <4 Follow-up Not Indicated     Goals Addressed             This Visit's Progress    To drink at least 4 glasses of water a day               Objective:    Today's Vitals   02/10/24 1342  BP: 119/72  Weight: 155 lb (70.3 kg)  Height: 5' 3 (1.6 m)   Body mass index is 27.46  kg/m.  Hearing/Vision screen No results found. Immunizations and Health Maintenance Health Maintenance  Topic Date Due   COVID-19 Vaccine (9 - Moderna risk 2025-26 season) 06/09/2024   Medicare Annual Wellness (AWV)  02/09/2025   DTaP/Tdap/Td (4 - Td or Tdap) 11/10/2030   Pneumococcal Vaccine: 50+ Years  Completed   Influenza Vaccine  Completed   Bone Density Scan  Completed   Zoster Vaccines- Shingrix  Completed   Meningococcal B Vaccine  Aged Out   Hepatitis B Vaccines 19-59 Average Risk  Discontinued        Assessment/Plan:  This is a routine wellness examination for Michele Williamson .  Patient Care Team: Daryl Setter, NP as PCP - General (Internal Medicine)  I have personally reviewed and noted the following in the patients chart:  Medical and social history Use of alcohol, tobacco or illicit drugs  Current medications and supplements including opioid prescriptions. Functional ability and status Nutritional status Physical activity Advanced directives List of other physicians Hospitalizations, surgeries, and ER visits in previous 12 months Vitals Screenings to include cognitive, depression, and falls Referrals and appointments  Orders Placed This Encounter  Procedures   MM 3D SCREENING MAMMOGRAM BILATERAL BREAST    Standing Status:   Future    Expected Date:   06/23/2024    Expiration Date:   02/09/2025    Scheduling Instructions:     [facility]    Reason for Exam (SYMPTOM  OR DIAGNOSIS REQUIRED):   breast cancer screening    Preferred imaging location?:   External   In addition, I have reviewed and discussed with patient certain preventive protocols, quality metrics, and best practice recommendations. A written personalized care plan for preventive services as well as general preventive health recommendations were provided to patient.   Lolita Libra, CMA   02/10/2024   Return in 1 year (on 02/09/2025).  After Visit Summary: (MyChart) Due to this being a  telephonic visit, the after visit summary with patients personalized plan was offered to patient via MyChart   Nurse Notes: see phone note

## 2024-02-17 NOTE — Progress Notes (Signed)
 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 Left a voicemail to return my call.

## 2024-03-01 ENCOUNTER — Ambulatory Visit
Admission: RE | Admit: 2024-03-01 | Discharge: 2024-03-01 | Disposition: A | Discharge status: Home / Self Care | Source: Ambulatory Visit | Attending: Radiation Oncology | Admitting: Radiation Oncology

## 2024-03-01 DIAGNOSIS — M150 Primary generalized (osteo)arthritis: Secondary | ICD-10-CM

## 2024-03-03 ENCOUNTER — Encounter: Payer: Self-pay | Admitting: Family

## 2024-03-09 ENCOUNTER — Other Ambulatory Visit (HOSPITAL_BASED_OUTPATIENT_CLINIC_OR_DEPARTMENT_OTHER): Payer: Self-pay

## 2024-03-09 ENCOUNTER — Ambulatory Visit (INDEPENDENT_AMBULATORY_CARE_PROVIDER_SITE_OTHER): Admitting: Family

## 2024-03-09 ENCOUNTER — Encounter: Payer: Self-pay | Admitting: Family

## 2024-03-09 VITALS — BP 140/65 | HR 92 | Temp 98.0°F | Ht 63.0 in | Wt 159.2 lb

## 2024-03-09 DIAGNOSIS — G629 Polyneuropathy, unspecified: Secondary | ICD-10-CM

## 2024-03-09 DIAGNOSIS — I10 Essential (primary) hypertension: Secondary | ICD-10-CM | POA: Diagnosis not present

## 2024-03-09 DIAGNOSIS — I2510 Atherosclerotic heart disease of native coronary artery without angina pectoris: Secondary | ICD-10-CM | POA: Diagnosis not present

## 2024-03-09 DIAGNOSIS — M5416 Radiculopathy, lumbar region: Secondary | ICD-10-CM

## 2024-03-09 DIAGNOSIS — I5032 Chronic diastolic (congestive) heart failure: Secondary | ICD-10-CM | POA: Diagnosis not present

## 2024-03-09 DIAGNOSIS — Z950 Presence of cardiac pacemaker: Secondary | ICD-10-CM

## 2024-03-09 HISTORY — DX: Presence of cardiac pacemaker: Z95.0

## 2024-03-09 HISTORY — DX: Radiculopathy, lumbar region: M54.16

## 2024-03-09 MED ORDER — METHYLPREDNISOLONE 4 MG PO TBPK
ORAL_TABLET | ORAL | 0 refills | Status: DC
  Filled 2024-03-09: qty 21, 6d supply, fill #0

## 2024-03-09 MED ORDER — GABAPENTIN 100 MG PO CAPS: 200.0000 mg | ORAL_CAPSULE | Freq: Three times a day (TID) | ORAL | Status: DC

## 2024-03-09 MED ORDER — FUROSEMIDE 20 MG PO TABS: 20.0000 mg | ORAL_TABLET | Freq: Every day | ORAL | 0 refills | Status: DC

## 2024-03-09 MED ORDER — METHYLPREDNISOLONE 4 MG PO TBPK: ORAL_TABLET | ORAL | 0 refills | Status: DC

## 2024-03-09 NOTE — Progress Notes (Signed)
 "  Subjective:     Patient ID: Michele Williamson, female    DOB: 1940-09-01, 84 y.o.   MRN: [id number]  Chief Complaint  Patient presents with   Spasms    Onset - at least 10 days ago - gluteal spasms - comes and goes   Medication Reaction    Patient wanted to know if any of her medications is causing liver problems    HPI  Discussed the use of AI scribe software for clinical note transcription with the patient, who gave verbal consent to proceed.  History of Present Illness Michele Williamson is an 84 year old female with chronic pain who presents with worsening leg pain.  She has been experiencing worsening sharp, electric pain in her leg for the past ten days, differing from previous episodes in location. She recalls a prior diagnosis of two inflamed nerves, with only one treated, and suspects the untreated nerve may now be contributing to her symptoms.  Her current medication regimen includes baclofen, initially taken four times a day, now reduced to two to three times daily, providing relief for six to seven hours. She also takes meloxicam  and gabapentin , with a recent increase in gabapentin  dose to 200 mg three times a day. She has previously received injections under x-ray guidance, with the last one occurring about six weeks ago, but reports that the pain has since shifted location.  No numbness or weakness in the left leg, and no back pain, but she experiences pain radiating down the leg to the ankles. She has not had an MRI of her lower back and is unsure if one has been performed.  She has a history of a pacemaker insertion and has canceled a follow-up appointment with her cardiologist. She is not currently taking furosemide  due to frequent urination and dehydration concerns, and she does not monitor her weight daily. She wears incontinence pads due to frequent urination and occasional unawareness of urination.  She lives with her 96 year old adopted son, Michele Williamson, and daughter,  Michele Williamson, and expresses a desire to live until her son is independent.      There are no preventive care reminders to display for this patient.  Past Medical History:  Diagnosis Date   Arthritis    shoulder, hands , feet, knees,   Complete heart block (HCC) 09/15/2023   Complication of anesthesia    Heart murmur    Hyperglycemia    Hyperlipemia    Hypertension    Osteopenia    PONV (postoperative nausea and vomiting)     Past Surgical History:  Procedure Laterality Date   BUNIONECTOMY Right    CARPAL TUNNEL RELEASE     CATARACT EXTRACTION     CESAREAN SECTION     CHOLECYSTECTOMY  2001   PACEMAKER IMPLANT N/A 09/16/2023   Procedure: PACEMAKER IMPLANT;  Surgeon: Nancey Eulas BRAVO, MD;  Location: [facility];  Service: Cardiovascular;  Laterality: N/A;   REVERSE SHOULDER ARTHROPLASTY Left 06/28/2020   Procedure: REVERSE SHOULDER ARTHROPLASTY;  Surgeon: Melita Drivers, MD;  Location: [facility];  Service: Orthopedics;  Laterality: Left;    ROTATOR CUFF REPAIR Right     Family History  Problem Relation Age of Onset   Hypertension Mother    Heart disease Mother    Bone cancer Father    Hypertension Father    Hyperlipidemia Father    Melanoma Brother    Stomach cancer Maternal Grandmother    Obesity Son     Social History  Socioeconomic History   Marital status: Widowed    Spouse name: Not on file   Number of children: Not on file   Years of education: Not on file   Highest education level: 12th grade  Occupational History   Not on file  Tobacco Use   Smoking status: Never   Smokeless tobacco: Never  Vaping Use   Vaping status: Never Used  Substance and Sexual Activity   Alcohol use: No   Drug use: No   Sexual activity: Not Currently    Birth control/protection: None  Other Topics Concern   Not on file  Social History Narrative   Lives in [place]   Has an adopted son born March 25, 2008   Husband died in 2006/03/25   Retired, worked as an insurance underwriter    Enjoys senior center   Has a Veterinary Surgeon   Completed HS   Social Drivers of Health   Tobacco Use: Low Risk (03/09/2024)   Patient History    Smoking Tobacco Use: Never    Smokeless Tobacco Use: Never    Passive Exposure: Not on file  Financial Resource Strain: Low Risk (12/14/2023)   Received from [facility]   Overall Financial Resource Strain (CARDIA)    How hard is it for you to pay for the very basics like food, housing, medical care, and heating?: Not hard at all  Food Insecurity: No Food Insecurity (02/10/2024)   Epic    Worried About Programme Researcher, Broadcasting/film/video in the Last Year: Never true    Ran Out of Food in the Last Year: Never true  Transportation Needs: No Transportation Needs (02/10/2024)   Epic    Lack of Transportation (Medical): No    Lack of Transportation (Non-Medical): No  Physical Activity: Inactive (02/10/2024)   Exercise Vital Sign    Days of Exercise per Week: 0 days    Minutes of Exercise per Session: 0 min  Stress: Stress Concern Present (02/10/2024)   [employer] of Occupational Health - Occupational Stress Questionnaire    Feeling of Stress: Rather much  Social Connections: Moderately Integrated (02/10/2024)   Social Connection and Isolation Panel    Frequency of Communication with Friends and Family: More than three times a week    Frequency of Social Gatherings with Friends and Family: More than three times a week    Attends Religious Services: More than 4 times per year    Active Member of [employer] or Organizations: Yes    Attends Banker Meetings: More than 4 times per year    Marital Status: Widowed  Intimate Partner Violence: Not At Risk (02/10/2024)   Epic    Fear of Current or Ex-Partner: No    Emotionally Abused: No    Physically Abused: No    Sexually Abused: No  Depression (PHQ2-9): Low Risk (02/10/2024)   Depression (PHQ2-9)    PHQ-2 Score: 1  Alcohol Screen: Low Risk (03/19/2022)   Alcohol Screen    Last Alcohol  Screening Score (AUDIT): 0  Housing: Low Risk (02/10/2024)   Epic    Unable to Pay for Housing in the Last Year: No    Number of Times Moved in the Last Year: 0    Homeless in the Last Year: No  Utilities: Not At Risk (02/10/2024)   Epic    Threatened with loss of utilities: No  Health Literacy: Not on file    Outpatient Medications Prior to Visit  Medication Sig Dispense Refill   alendronate  (FOSAMAX ) 70  MG tablet Take 1 tablet (70 mg total) by mouth every 7 (seven) days. Take with a full glass of water on an empty stomach. 12 tablet 4   aspirin  EC 81 MG tablet Take 1 tablet (81 mg total) by mouth daily. Swallow whole. (Patient taking differently: Take 81 mg by mouth daily as needed. Swallow whole.) 90 tablet 3   CALCIUM  CARBONATE-VITAMIN D  PO Take 1 tablet by mouth daily. 600-400 mg     diclofenac  Sodium (VOLTAREN ) 1 % GEL Apply 2 g topically 4 (four) times daily as needed. 100 g 0   Iron , Ferrous Sulfate , 325 (65 Fe) MG TABS Take 325 mg by mouth every other day. 30 tablet    lisinopril  (ZESTRIL ) 10 MG tablet Take 1 tablet (10 mg total) by mouth daily. Hold if blood pressure is <110. 90 tablet 0   loratadine (CLARITIN) 10 MG tablet Take 10 mg by mouth daily. [employer]     meloxicam  (MOBIC ) 7.5 MG tablet Take 1 tablet (7.5 mg total) by mouth daily. 90 tablet 3   Multiple Vitamins-Minerals (MULTIVITAMIN WITH MINERALS) tablet Take 1 tablet by mouth daily.     nortriptyline (PAMELOR) 25 MG capsule Take 25 mg by mouth at bedtime.     omeprazole  (PRILOSEC) 20 MG capsule Take 1 capsule (20 mg total) by mouth daily. 90 capsule 0   rosuvastatin  (CRESTOR ) 20 MG tablet Take 1 tablet (20 mg total) by mouth at bedtime. 90 tablet 0   TWINRIX 720-20 ELU-MCG/ML injection      furosemide  (LASIX ) 80 MG tablet Take 1 tablet (80 mg total) by mouth daily. (Patient taking differently: Take 80 mg by mouth daily. Takes 1-2 times weekly if swelling in feet/ankles) 90 tablet 0   gabapentin  (NEURONTIN ) 100 MG  capsule Take 1 capsule (100 mg total) by mouth 3 (three) times daily. 270 capsule 0   No facility-administered medications prior to visit.    Allergies[1]  ROS See HPI    Objective:    Physical Exam Constitutional:      General: She is not in acute distress.    Appearance: Normal appearance. She is well-developed.  HENT:     Head: Normocephalic and atraumatic.     Right Ear: External ear normal.     Left Ear: External ear normal.  Eyes:     General: No scleral icterus. Neck:     Thyroid : No thyromegaly.  Cardiovascular:     Rate and Rhythm: Normal rate and regular rhythm.     Heart sounds: Normal heart sounds. No murmur heard. Pulmonary:     Effort: Pulmonary effort is normal. No respiratory distress.     Breath sounds: Normal breath sounds. No wheezing.  Musculoskeletal:     Cervical back: Neck supple.  Skin:    General: Skin is warm and dry.  Neurological:     Mental Status: She is alert and oriented to person, place, and time.     Motor: No weakness.     Deep Tendon Reflexes:     Reflex Scores:      Patellar reflexes are 3+ on the right side and 3+ on the left side.    Comments: Bilateral LE strength is 5/5  Psychiatric:        Mood and Affect: Mood normal.        Behavior: Behavior normal.        Thought Content: Thought content normal.        Judgment: Judgment normal.  BP (!) 140/65 (BP Location: Right Arm, Patient Position: Sitting, Cuff Size: Normal)   Pulse 92   Temp 98 F (36.7 C) (Oral)   Ht 5' 3 (1.6 m)   Wt 159 lb 3.2 oz (72.2 kg)   SpO2 99%   BMI 28.20 kg/m  Wt Readings from Last 3 Encounters:  03/09/24 159 lb 3.2 oz (72.2 kg)  02/10/24 155 lb (70.3 kg)  12/23/23 155 lb 12.8 oz (70.7 kg)       Assessment & Plan:   Problem List Items Addressed This Visit       Unprioritized   Presence of cardiac pacemaker    Pacemaker placed for complete heart block. She has not followed up with cardiology since placement. Discussed the need  for follow-up with general cardiology. - Referred to general cardiology for follow-up and management of CAD/CHF.      Neuropathy   Relevant Medications   gabapentin  (NEURONTIN ) 100 MG capsule   Lumbar radiculopathy - Primary    Chronic lumbar radiculopathy with electric sharp pain radiating down the left leg. Current management includes baclofen, meloxicam , and gabapentin . She prefers to avoid surgery and is hesitant about nerve ablation. - Increased gabapentin  to 200 mg three times daily if tolerated. - Prescribed Medrol  Dosepak for short-term relief. - Referral to orthopedics for further evaluation. She hopes to avoid PT If possible due to such a high copay      Relevant Medications   gabapentin  (NEURONTIN ) 100 MG capsule   methylPREDNISolone  (MEDROL  DOSEPAK) 4 MG TBPK tablet   Hypertension   BP acceptable for her age. Continue lisinopril  10 mg.       Relevant Medications   furosemide  (LASIX ) 20 MG tablet   Chronic diastolic CHF (congestive heart failure) (HCC)   She was previously on lasix  80mg  daily but self discontinued.   Discussed the importance of monitoring weight. - Monitor weight daily to detect fluid overload. - Call if weight gain of 3 pounds overnight or 5 pounds in a week is noted. - Consider reducing furosemide  to 20 mg daily if weight is stable. - Clinically euvolemic. Will restart lasix  at 20mg  daily and arrange follow up with primary cardiology.       Relevant Medications   furosemide  (LASIX ) 20 MG tablet   Other Relevant Orders   Ambulatory referral to Cardiology   CAD (coronary artery disease)   Clinically stable, refer back to cardiology for ongoing management.       Relevant Medications   furosemide  (LASIX ) 20 MG tablet   Other Relevant Orders   Ambulatory referral to Cardiology   Assessment & Plan    I have discontinued Michele Williamson's furosemide . I have also changed her gabapentin . Additionally, I am having her start on furosemide .  Lastly, I am having her maintain her loratadine, aspirin  EC, multivitamin with minerals, diclofenac  Sodium, CALCIUM  CARBONATE-VITAMIN D  PO, alendronate , Iron  (Ferrous Sulfate ), meloxicam , nortriptyline, Twinrix, rosuvastatin , omeprazole , lisinopril , and methylPREDNISolone .  Meds ordered this encounter  Medications   gabapentin  (NEURONTIN ) 100 MG capsule    Sig: Take 2 capsules (200 mg total) by mouth 3 (three) times daily.    Supervising Provider:   DOMENICA BLACKBIRD A [[id number]]   DISCONTD: methylPREDNISolone  (MEDROL  DOSEPAK) 4 MG TBPK tablet    Sig: Take per package instructions    Dispense:  21 tablet    Refill:  0    Supervising Provider:   DOMENICA BLACKBIRD A [[id number]]   methylPREDNISolone  (MEDROL  DOSEPAK) 4 MG TBPK tablet  Sig: Take per package instructions    Dispense:  21 tablet    Refill:  0    Supervising Provider:   DOMENICA BLACKBIRD A [[id number]]   furosemide  (LASIX ) 20 MG tablet    Sig: Take 1 tablet (20 mg total) by mouth daily.    Dispense:  90 tablet    Refill:  0    Supervising Provider:   DOMENICA BLACKBIRD A [[id number]]      [1]  Allergies Allergen Reactions   Cat Dander    Oxycodone -Acetaminophen  Nausea Only   Penicillins     Childhood Pt received Ancef  on 06-28-20 and tolerated it well   Pregabalin     Dizziness   Tramadol Nausea And Vomiting   "

## 2024-03-09 NOTE — Assessment & Plan Note (Signed)
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 Current management includes baclofen, meloxicam , and gabapentin . She prefers to avoid surgery and is hesitant about nerve ablation. - Increased gabapentin  to 200 mg three times daily if tolerated. - Prescribed Medrol  Dosepak for short-term relief. - Referral to orthopedics for further evaluation. She hopes to avoid PT If possible due to such a high copay "

## 2024-03-09 NOTE — Assessment & Plan Note (Signed)
" °  Pacemaker placed for complete heart block. She has not followed up with cardiology since placement. Discussed the need for follow-up with general cardiology. - Referred to general cardiology for follow-up and management of CAD/CHF. "

## 2024-03-09 NOTE — Assessment & Plan Note (Signed)
 She was previously on lasix  80mg  daily but self discontinued.   Discussed the importance of monitoring weight. - Monitor weight daily to detect fluid overload. - Call if weight gain of 3 pounds overnight or 5 pounds in a week is noted. - Consider reducing furosemide  to 20 mg daily if weight is stable. - Clinically euvolemic. Will restart lasix  at 20mg  daily and arrange follow up with primary cardiology.

## 2024-03-09 NOTE — Assessment & Plan Note (Signed)
 BP acceptable for her age. Continue lisinopril  10 mg.

## 2024-03-09 NOTE — Assessment & Plan Note (Signed)
 Clinically stable, refer back to cardiology for ongoing management.

## 2024-03-10 MED ORDER — ASPIRIN EC 81 MG PO TBEC: 81.0000 mg | DELAYED_RELEASE_TABLET | Freq: Every day | ORAL | Status: AC

## 2024-03-10 NOTE — Patient Instructions (Signed)
" °  VISIT SUMMARY: During your visit, we discussed your worsening leg pain, chronic diastolic heart failure, hypertension, urinary incontinence, and the presence of your cardiac pacemaker. We reviewed your current medications and made some adjustments to better manage your symptoms.  YOUR PLAN: -LUMBAR RADICULOPATHY: Lumbar radiculopathy is a condition where a nerve in the lower back is pinched, causing pain to radiate down the leg. We have increased your gabapentin  dose to 200 mg three times daily if you can tolerate it, and prescribed a Medrol  Dosepak for short-term relief. We have also referred you to Orthopedics.  -CHRONIC DIASTOLIC HEART FAILURE: Chronic diastolic heart failure is when the heart has difficulty relaxing and filling with blood. It is important to monitor your weight daily to detect fluid overload. Please restart furosemide  at 20mg  once daily in the AM.  -HYPERTENSION: Hypertension is high blood pressure. Continue taking your lisinopril  10 mg daily as prescribed.  -URINARY INCONTINENCE: Urinary incontinence is the loss of bladder control, leading to frequent urination. Continue using protective garments as needed.  -PRESENCE OF CARDIAC PACEMAKER: A cardiac pacemaker is a device implanted to help manage irregular heartbeats. It is important to follow up with general cardiology for the management of your pacemaker.  INSTRUCTIONS: Please monitor your weight daily and restart furosemide  if you notice a significant weight gain. Follow up with general cardiology for your pacemaker management. If physical therapy is not pursued for your leg pain, we may consider referring you to orthopedics for an MRI.                           "

## 2024-03-19 ENCOUNTER — Encounter: Payer: Self-pay | Admitting: Family

## 2024-03-19 DIAGNOSIS — M5416 Radiculopathy, lumbar region: Secondary | ICD-10-CM

## 2024-04-05 ENCOUNTER — Ambulatory Visit: Admitting: Family

## 2024-04-05 VITALS — BP 123/56 | HR 88 | Temp 98.1°F | Resp 16 | Ht 63.0 in | Wt 159.0 lb

## 2024-04-05 DIAGNOSIS — E782 Mixed hyperlipidemia: Secondary | ICD-10-CM

## 2024-04-05 DIAGNOSIS — I5032 Chronic diastolic (congestive) heart failure: Secondary | ICD-10-CM

## 2024-04-05 DIAGNOSIS — R739 Hyperglycemia, unspecified: Secondary | ICD-10-CM | POA: Diagnosis not present

## 2024-04-05 DIAGNOSIS — G629 Polyneuropathy, unspecified: Secondary | ICD-10-CM

## 2024-04-05 DIAGNOSIS — K219 Gastro-esophageal reflux disease without esophagitis: Secondary | ICD-10-CM

## 2024-04-05 DIAGNOSIS — I10 Essential (primary) hypertension: Secondary | ICD-10-CM

## 2024-04-05 DIAGNOSIS — M5416 Radiculopathy, lumbar region: Secondary | ICD-10-CM

## 2024-04-05 DIAGNOSIS — M810 Age-related osteoporosis without current pathological fracture: Secondary | ICD-10-CM

## 2024-04-05 DIAGNOSIS — D509 Iron deficiency anemia, unspecified: Secondary | ICD-10-CM

## 2024-04-05 LAB — CBC WITH DIFFERENTIAL/PLATELET
Basophils Absolute: 0.1 10*3/uL (ref 0.0–0.1)
Basophils Relative: 0.7 % (ref 0.0–3.0)
Eosinophils Absolute: 0.2 10*3/uL (ref 0.0–0.7)
Eosinophils Relative: 1.7 % (ref 0.0–5.0)
HCT: 38.2 % (ref 36.0–46.0)
Hemoglobin: 13.1 g/dL (ref 12.0–15.0)
Lymphocytes Relative: 19.9 % (ref 12.0–46.0)
Lymphs Abs: 2 10*3/uL (ref 0.7–4.0)
MCHC: 34.2 g/dL (ref 30.0–36.0)
MCV: 96.3 fl (ref 78.0–100.0)
Monocytes Absolute: 0.8 10*3/uL (ref 0.1–1.0)
Monocytes Relative: 7.5 % (ref 3.0–12.0)
Neutro Abs: 7.1 10*3/uL (ref 1.4–7.7)
Neutrophils Relative %: 70.2 % (ref 43.0–77.0)
Platelets: 244 10*3/uL (ref 150.0–400.0)
RBC: 3.97 Mil/uL (ref 3.87–5.11)
RDW: 13.6 % (ref 11.5–15.5)
WBC: 10.1 10*3/uL (ref 4.0–10.5)

## 2024-04-05 LAB — COMPREHENSIVE METABOLIC PANEL WITH GFR
ALT: 10 U/L (ref 3–35)
AST: 18 U/L (ref 5–37)
Albumin: 4.4 g/dL (ref 3.5–5.2)
Alkaline Phosphatase: 51 U/L (ref 39–117)
BUN: 21 mg/dL (ref 6–23)
CO2: 25 meq/L (ref 19–32)
Calcium: 9.6 mg/dL (ref 8.4–10.5)
Chloride: 107 meq/L (ref 96–112)
Creatinine, Ser: 0.91 mg/dL (ref 0.40–1.20)
GFR: 59.19 mL/min — ABNORMAL LOW
Glucose, Bld: 97 mg/dL (ref 70–99)
Potassium: 4.5 meq/L (ref 3.5–5.1)
Sodium: 141 meq/L (ref 135–145)
Total Bilirubin: 0.3 mg/dL (ref 0.2–1.2)
Total Protein: 6.8 g/dL (ref 6.0–8.3)

## 2024-04-05 LAB — LIPID PANEL
Cholesterol: 174 mg/dL (ref 28–200)
HDL: 43.2 mg/dL
NonHDL: 131.23
Total CHOL/HDL Ratio: 4
Triglycerides: 490 mg/dL — ABNORMAL HIGH (ref 10.0–149.0)
VLDL: 98 mg/dL — ABNORMAL HIGH (ref 0.0–40.0)

## 2024-04-05 LAB — LDL CHOLESTEROL, DIRECT: Direct LDL: 82 mg/dL

## 2024-04-05 LAB — IBC + FERRITIN
Ferritin: 39.7 ng/mL (ref 10.0–291.0)
Iron: 79 ug/dL (ref 42–145)
Saturation Ratios: 20.2 % (ref 20.0–50.0)
TIBC: 390.6 ug/dL (ref 250.0–450.0)
Transferrin: 279 mg/dL (ref 212.0–360.0)

## 2024-04-05 LAB — HEMOGLOBIN A1C: Hgb A1c MFr Bld: 5.9 % (ref 4.6–6.5)

## 2024-04-05 MED ORDER — GABAPENTIN 100 MG PO CAPS: 200.0000 mg | ORAL_CAPSULE | Freq: Three times a day (TID) | ORAL | 1 refills | Status: AC

## 2024-04-05 MED ORDER — NORTRIPTYLINE HCL 25 MG PO CAPS: 25.0000 mg | ORAL_CAPSULE | Freq: Every day | ORAL | 1 refills | Status: AC

## 2024-04-05 MED ORDER — MELOXICAM 7.5 MG PO TABS: 7.5000 mg | ORAL_TABLET | Freq: Every day | ORAL | 3 refills | Status: AC

## 2024-04-05 MED ORDER — ROSUVASTATIN CALCIUM 20 MG PO TABS: 20.0000 mg | ORAL_TABLET | Freq: Every day | ORAL | 1 refills | Status: AC

## 2024-04-05 MED ORDER — BACLOFEN 10 MG PO TABS: 10.0000 mg | ORAL_TABLET | Freq: Two times a day (BID) | ORAL | 3 refills | Status: AC

## 2024-04-05 MED ORDER — ALENDRONATE SODIUM 70 MG PO TABS: 70.0000 mg | ORAL_TABLET | ORAL | 4 refills | Status: AC

## 2024-04-05 MED ORDER — LISINOPRIL 10 MG PO TABS: 10.0000 mg | ORAL_TABLET | Freq: Every day | ORAL | 1 refills | Status: AC

## 2024-04-05 MED ORDER — FUROSEMIDE 20 MG PO TABS: 20.0000 mg | ORAL_TABLET | Freq: Every day | ORAL | 1 refills | Status: AC

## 2024-04-05 NOTE — Assessment & Plan Note (Signed)
 Stable without medication

## 2024-04-05 NOTE — Assessment & Plan Note (Addendum)
 Reports that she is doing well on lasix  daily.  Appears euvolemic today. BP Readings from Last 3 Encounters:  04/05/24 (!) 123/56  03/09/24 (!) 140/65  02/10/24 119/72

## 2024-04-05 NOTE — Assessment & Plan Note (Signed)
 On fosamax  since 2024, continue same.  She also is maintained on a calcium  supplement.

## 2024-04-05 NOTE — Assessment & Plan Note (Signed)
Stable on gabapentin, continue same.  

## 2024-04-05 NOTE — Patient Instructions (Signed)
" °  VISIT SUMMARY: During your routine follow-up visit, we discussed your significant improvement in chronic back pain and your decision not to follow up with neurosurgery. We reviewed your current medications and their effectiveness, including gabapentin  for neuropathy and Lasix  for heart failure. We also addressed your recent fall and your interest in starting water aerobics for exercise.  YOUR PLAN: -LUMBAR RADICULOPATHY: Lumbar radiculopathy is a condition where nerve roots in the lower back are compressed, causing pain and discomfort. You are experiencing improvement with your current medications, gabapentin  and baclofen , so you should continue taking them as prescribed.  -CHRONIC DIASTOLIC HEART FAILURE: Chronic diastolic heart failure is a condition where the heart has difficulty relaxing and filling with blood. Your condition is well-managed with daily Lasix , so you should continue taking it as prescribed.  -PRIMARY HYPERTENSION: Primary hypertension is high blood pressure without a known secondary cause. Your blood pressure is well-controlled with lisinopril  10 mg daily, so you should continue taking it as prescribed.  -POLYNEUROPATHY: Polyneuropathy is a condition where multiple peripheral nerves are damaged, causing weakness, numbness, and pain. You are managing this condition with gabapentin , so you should continue taking it as prescribed.  -OSTEOPOROSIS: Osteoporosis is a condition where bones become weak and brittle. You are managing this condition with Fosamax , and a bone density reassessment is planned for May 2027. Continue taking Fosamax  as prescribed.  -MIXED HYPERLIPIDEMIA: Mixed hyperlipidemia is a condition with elevated levels of cholesterol and triglycerides in the blood. You are managing this condition with rosuvastatin , and a cholesterol panel has been ordered to check your levels.  -IRON  DEFICIENCY ANEMIA: Iron  deficiency anemia is a condition where there is a lack of iron  in  the body, leading to a reduced number of red blood cells. You are managing this condition with iron  supplementation every other day, so you should continue taking it as prescribed.  -GENERAL HEALTH MAINTENANCE: We discussed weight management and physical activity options. You are encouraged to participate in water aerobics and to follow the weight management strategies we discussed.  INSTRUCTIONS: Please follow up with the cardiologist next week as scheduled. Continue all current medications as prescribed. A cholesterol panel has been ordered, so please complete this lab test. Your next bone density reassessment is planned for May 2027.    Contains text generated by [employer].   "

## 2024-04-05 NOTE — Assessment & Plan Note (Signed)
 Lab Results  Component Value Date   WBC 8.7 10/25/2023   HGB 12.9 10/25/2023   HCT 40.5 10/25/2023   MCV 95.3 10/25/2023   PLT 265 10/25/2023

## 2024-04-05 NOTE — Assessment & Plan Note (Signed)
 Stable/improved. Continues tylenol  and baclofen .

## 2024-04-08 ENCOUNTER — Ambulatory Visit: Payer: Self-pay | Admitting: Family

## 2024-04-08 DIAGNOSIS — T8859XA Other complications of anesthesia, initial encounter: Secondary | ICD-10-CM | POA: Insufficient documentation

## 2024-04-08 DIAGNOSIS — E782 Mixed hyperlipidemia: Secondary | ICD-10-CM

## 2024-04-08 DIAGNOSIS — R011 Cardiac murmur, unspecified: Secondary | ICD-10-CM | POA: Insufficient documentation

## 2024-04-08 MED ORDER — FISH OIL 1000 MG PO CAPS: 2.0000 | ORAL_CAPSULE | Freq: Two times a day (BID) | ORAL | Status: AC

## 2024-04-08 NOTE — Progress Notes (Signed)
 Patient notified of results, recommendations and comments from provider. She was schedule for lipid panel in 6 weeks.

## 2024-04-08 NOTE — Telephone Encounter (Signed)
 Triglycerides are higher than they were. Recommend that she add fish oil  2000 mg bid otc.  Recommend that she also limit sugar/white fluffy carbs in her diet. Repeat lipid panel fasting in 6 weeks.

## 2024-04-12 ENCOUNTER — Ambulatory Visit

## 2024-04-28 ENCOUNTER — Encounter

## 2024-05-20 ENCOUNTER — Other Ambulatory Visit

## 2024-07-28 ENCOUNTER — Encounter

## 2024-10-27 ENCOUNTER — Encounter

## 2025-02-10 ENCOUNTER — Ambulatory Visit
